# Patient Record
Sex: Female | Born: 1950 | Race: White | Hispanic: No | Smoking: Former smoker
Health system: Southern US, Community
[De-identification: ages and names within clinical notes are randomized; demographics above are authoritative.]

## PROBLEM LIST (undated history)

## (undated) DIAGNOSIS — T7840XA Allergy, unspecified, initial encounter: Secondary | ICD-10-CM

## (undated) DIAGNOSIS — I10 Essential (primary) hypertension: Secondary | ICD-10-CM

## (undated) DIAGNOSIS — E559 Vitamin D deficiency, unspecified: Secondary | ICD-10-CM

## (undated) DIAGNOSIS — F419 Anxiety disorder, unspecified: Secondary | ICD-10-CM

## (undated) DIAGNOSIS — E7800 Pure hypercholesterolemia, unspecified: Secondary | ICD-10-CM

## (undated) DIAGNOSIS — R413 Other amnesia: Secondary | ICD-10-CM

## (undated) DIAGNOSIS — M810 Age-related osteoporosis without current pathological fracture: Secondary | ICD-10-CM

## (undated) HISTORY — DX: Other amnesia: R41.3

## (undated) HISTORY — PX: TONSILLECTOMY: SUR1361

## (undated) HISTORY — DX: Pure hypercholesterolemia, unspecified: E78.00

## (undated) HISTORY — DX: Essential (primary) hypertension: I10

## (undated) HISTORY — PX: LAPAROSCOPY: SHX197

## (undated) HISTORY — DX: Anxiety disorder, unspecified: F41.9

## (undated) HISTORY — DX: Allergy, unspecified, initial encounter: T78.40XA

## (undated) HISTORY — DX: Age-related osteoporosis without current pathological fracture: M81.0

## (undated) HISTORY — DX: Vitamin D deficiency, unspecified: E55.9

---

## 2010-03-22 ENCOUNTER — Emergency Department (HOSPITAL_BASED_OUTPATIENT_CLINIC_OR_DEPARTMENT_OTHER): Admission: EM | Admit: 2010-03-22 | Discharge: 2010-03-22 | Payer: Self-pay | Admitting: Emergency Medicine

## 2010-03-22 ENCOUNTER — Ambulatory Visit: Payer: Self-pay | Admitting: Diagnostic Radiology

## 2017-04-18 ENCOUNTER — Ambulatory Visit (HOSPITAL_COMMUNITY): Admission: EM | Admit: 2017-04-18 | Discharge: 2017-04-18 | Discharge status: Absent without leave | Payer: Self-pay

## 2019-08-02 ENCOUNTER — Other Ambulatory Visit: Payer: Self-pay

## 2019-08-02 ENCOUNTER — Ambulatory Visit: Payer: Medicare PPO | Admitting: Neurology

## 2019-08-02 ENCOUNTER — Encounter: Payer: Self-pay | Admitting: Neurology

## 2019-08-02 ENCOUNTER — Telehealth: Payer: Self-pay | Admitting: Neurology

## 2019-08-02 VITALS — BP 168/88 | HR 71 | Temp 97.6°F | Ht 60.0 in | Wt 144.6 lb

## 2019-08-02 DIAGNOSIS — R413 Other amnesia: Secondary | ICD-10-CM

## 2019-08-02 DIAGNOSIS — E538 Deficiency of other specified B group vitamins: Secondary | ICD-10-CM | POA: Diagnosis not present

## 2019-08-02 NOTE — Telephone Encounter (Signed)
08/02/2019 Ethlyn Gallery [id number] exp 08/02/2019 -09/01/2019 GI Annabelle Harman c

## 2019-08-02 NOTE — Progress Notes (Signed)
PATIENT: Madison Glover DOB: 1951/04/16  Chief Complaint  Patient presents with  . Memory Loss    Rm 4, daughter Madison Glover, 6-8 mo worsening     HISTORICAL  Madison Glover is a 69 year old female, accompanied by her daughter Madison Glover, seen in request by her primary care PA Madison Glover for evaluation of memory loss, initial evaluation was on August 02, 2019.  I have reviewed and summarized the referring note from the referring physician.  She had a past medical history of hypertension, hyperlipidemia, is a retired Tourist information centre manager, she was noted to have gradual onset memory loss since 2018, she tends to repeat herself, misplace things, sometimes get up in the middle of the night confused  Patient herself denies significant difficulties, her symptoms seems to be more noticeable by her daughter.  She also complains of excessive stress in the past 10 years, now she is in a safe place, lives at her home, exercise regularly, attending church regularly  Laboratory evaluations in December 2020 showed normal CMP, with glucose of 110, creatinine of 1.0, LDL of 204, cholesterol of 272, vitamin B12 of 172, normal CBC, with hemoglobin of 13.8   REVIEW OF SYSTEMS: Full 14 system review of systems performed and notable only for as above All other review of systems were negative.  ALLERGIES: No Known Allergies  HOME MEDICATIONS: Current Outpatient Medications  Medication Sig Dispense Refill  . citalopram (CELEXA) 20 MG tablet Take 20 mg by mouth daily.    . hydrochlorothiazide (HYDRODIURIL) 25 MG tablet Take 25 mg by mouth daily.    . Metoprolol Succinate 100 MG CS24 Take by mouth daily.    . simvastatin (ZOCOR) 20 MG tablet Take 20 mg by mouth daily at 6 PM.     No current facility-administered medications for this visit.    PAST MEDICAL HISTORY: Past Medical History:  Diagnosis Date  . High cholesterol   . Hypertension     PAST SURGICAL HISTORY: History  reviewed. No pertinent surgical history.  FAMILY HISTORY: Family History  Problem Relation Age of Onset  . Cancer Mother   . COPD Father   . Heart disease Father     SOCIAL HISTORY: Social History   Socioeconomic History  . Marital status: Single    Spouse name: Not on file  . Number of children: Not on file  . Years of education: Not on file  . Highest education level: Not on file  Occupational History  . Not on file  Tobacco Use  . Smoking status: Never Smoker  . Smokeless tobacco: Never Used  Substance and Sexual Activity  . Alcohol use: Never  . Drug use: Never  . Sexual activity: Not on file  Other Topics Concern  . Not on file  Social History Narrative   Lives at home, alone, retired.  Education: [employer] Education. 2 daughters.    Social Determinants of Health   Financial Resource Strain:   . Difficulty of Paying Living Expenses: Not on file  Food Insecurity:   . Worried About Programme researcher, broadcasting/film/video in the Last Year: Not on file  . Ran Out of Food in the Last Year: Not on file  Transportation Needs:   . Lack of Transportation (Medical): Not on file  . Lack of Transportation (Non-Medical): Not on file  Physical Activity:   . Days of Exercise per Week: Not on file  . Minutes of Exercise per Session: Not on file  Stress:   .  Feeling of Stress : Not on file  Social Connections:   . Frequency of Communication with Friends and Family: Not on file  . Frequency of Social Gatherings with Friends and Family: Not on file  . Attends Religious Services: Not on file  . Active Member of Clubs or Organizations: Not on file  . Attends Archivist Meetings: Not on file  . Marital Status: Not on file  Intimate Partner Violence:   . Fear of Current or Ex-Partner: Not on file  . Emotionally Abused: Not on file  . Physically Abused: Not on file  . Sexually Abused: Not on file     PHYSICAL EXAM   Vitals:   08/02/19 0749  BP: (!) 168/88  Pulse: 71  Temp: 97.6  F (36.4 C)  Weight: 144 lb 9.6 oz (65.6 kg)  Height: 5' (1.524 m)    Not recorded      Body mass index is 28.24 kg/m.  PHYSICAL EXAMNIATION:  Gen: NAD, conversant, well nourised, well groomed                     Cardiovascular: Regular rate rhythm, no peripheral edema, warm, nontender. Eyes: Conjunctivae clear without exudates or hemorrhage Neck: Supple, no carotid bruits. Pulmonary: Clear to auscultation bilaterally   NEUROLOGICAL EXAM:  MMSE - Mini Mental State Exam 08/02/2019  Orientation to time 5  Orientation to Place 4  Registration 3  Attention/ Calculation 4  Recall 0  Language- name 2 objects 2  Language- repeat 1  Language- follow 3 step command 3  Language- read & follow direction 1  Write a sentence 1  Copy design 1  Total score 25  Animal naming 8   CRANIAL NERVES: CN II: Visual fields are full to confrontation. Pupils are round equal and briskly reactive to light. CN III, IV, VI: extraocular movement are normal. No ptosis. CN V: Facial sensation is intact to light touch CN VII: Face is symmetric with normal eye closure  CN VIII: Hearing is normal to causal conversation. CN IX, X: Phonation is normal. CN XI: Head turning and shoulder shrug are intact  MOTOR: There is no pronator drift of out-stretched arms. Muscle bulk and tone are normal. Muscle strength is normal.  REFLEXES: Reflexes are 2+ and symmetric at the biceps, triceps, knees, and ankles. Plantar responses are flexor.  SENSORY: Intact to light touch, pinprick and vibratory sensation are intact in fingers and toes.  COORDINATION: There is no trunk or limb dysmetria noted.  GAIT/STANCE: Posture is normal. Gait is steady with normal steps, base, arm swing, and turning. Heel and toe walking are normal. Tandem gait is normal.  Romberg is absent.   DIAGNOSTIC DATA (LABS, IMAGING, TESTING) - I reviewed patient records, labs, notes, testing and imaging myself where  available.   ASSESSMENT AND PLAN  Madison Glover is a 69 y.o. 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 supplement, 1000 mcg daily  Marcial Pacas, M.D. Ph.D.  [facility] [address] [place], [place] [address] Ph: [phone] Fax: [phone]  CC: Referring Provider

## 2019-08-02 NOTE — Patient Instructions (Signed)
Vitamin B12 one tablet daily

## 2019-09-13 NOTE — Telephone Encounter (Signed)
Updated Ethlyn Gallery: [id number] (exp. 09/19/19 to 10/19/19) patient is scheduled at GI for 09/19/19.

## 2019-09-19 ENCOUNTER — Other Ambulatory Visit: Payer: Self-pay

## 2019-10-08 ENCOUNTER — Other Ambulatory Visit: Payer: Self-pay

## 2019-10-15 ENCOUNTER — Ambulatory Visit
Admission: RE | Admit: 2019-10-15 | Discharge: 2019-10-15 | Disposition: A | Discharge status: Home / Self Care | Payer: Medicare PPO | Source: Ambulatory Visit | Attending: Neurology | Admitting: Neurology

## 2019-10-15 ENCOUNTER — Other Ambulatory Visit: Payer: Self-pay

## 2019-10-15 DIAGNOSIS — E538 Deficiency of other specified B group vitamins: Secondary | ICD-10-CM

## 2019-10-15 DIAGNOSIS — R413 Other amnesia: Secondary | ICD-10-CM

## 2019-10-17 ENCOUNTER — Telehealth: Payer: Self-pay | Admitting: Neurology

## 2019-10-17 ENCOUNTER — Telehealth: Payer: Self-pay

## 2019-10-17 NOTE — Telephone Encounter (Signed)
Voicemail left at 12:38p  Elonda Husky is requesting a call back to discuss MRI

## 2019-10-17 NOTE — Telephone Encounter (Signed)
I returned the call to her daughter, Elonda Husky [place] (on [place]). She is also aware of the MRI results. She plans to attend the patient's follow up on 10/24/19.

## 2019-10-17 NOTE — Telephone Encounter (Signed)
I was able to speak to patient and provide her with the MRI results below. She verbalized understanding and will keep her pending follow up for further review.

## 2019-10-17 NOTE — Telephone Encounter (Signed)
IMPRESSION:   MRI brain (without) demonstrating: - Multiple round periventricular and subcortical foci of T2 hyperintensities; likely chronic small vessel ischemic disease.  - No acute findings.  Please call patient, MRI of the brain showed generalized atrophy, supratentorium small vessel disease, there was no acute abnormalities.  I will review films with her at next follow-up visit

## 2019-10-18 NOTE — Telephone Encounter (Signed)
Open by error

## 2019-10-24 ENCOUNTER — Other Ambulatory Visit: Payer: Self-pay

## 2019-10-24 ENCOUNTER — Encounter: Payer: Self-pay | Admitting: Neurology

## 2019-10-24 ENCOUNTER — Ambulatory Visit: Payer: Medicare PPO | Admitting: Neurology

## 2019-10-24 VITALS — BP 139/81 | HR 66 | Temp 97.0°F | Ht 60.0 in | Wt 140.0 lb

## 2019-10-24 DIAGNOSIS — E538 Deficiency of other specified B group vitamins: Secondary | ICD-10-CM | POA: Diagnosis not present

## 2019-10-24 DIAGNOSIS — R413 Other amnesia: Secondary | ICD-10-CM

## 2019-10-24 MED ORDER — MEMANTINE HCL 5 MG PO TABS: ORAL_TABLET | ORAL | 0 refills | Status: DC

## 2019-10-24 NOTE — Patient Instructions (Signed)
Start taking aspirin 81 mg daily  Start namenda titration for the memory with 5 mg tablets   Take 1 tablet daily for one week, then take 1 tablet twice daily for one week, then take 1 tablet in the morning and 2 in the evening for one week, then take 2 tablets twice daily  Once complete titration, will send in 10 mg tablets   See you back in 6 months

## 2019-10-24 NOTE — Progress Notes (Signed)
I have reviewed and agreed above plan. 

## 2019-10-24 NOTE — Progress Notes (Signed)
PATIENT: Madison Glover DOB: 22-Mar-1951  REASON FOR VISIT: follow up HISTORY FROM: patient  HISTORY OF PRESENT ILLNESS: Today 10/24/19  HISTORY Madison Glover is a 69 year old female, accompanied by her daughter [name], seen in request by her primary care PA Marda Stalker for evaluation of memory loss, initial evaluation was on August 02, 2019.  I have reviewed and summarized the referring note from the referring physician.  She had a past medical history of hypertension, hyperlipidemia, is a retired Automotive engineer, she was noted to have gradual onset memory loss since 2018, she tends to repeat herself, misplace things, sometimes get up in the middle of the night confused  Patient herself denies significant difficulties, her symptoms seems to be more noticeable by her daughter.  She also complains of excessive stress in the past 10 years, now she is in a safe place, lives at her home, exercise regularly, attending church regularly  Laboratory evaluations in December 2020 showed normal CMP, with glucose of 110, creatinine of 1.0, LDL of 204, cholesterol of 272, vitamin B12 of 172, normal CBC, with hemoglobin of 13.8  Update October 24, 2019 SS: MRI of the brain in March 2021 showed generalized atrophy, supratentorium small vessel disease, no acute abnormality. Since last seen, has been stable. She is here with her daughter. She was diagnosed in 4th grade as slower learner, all her life, has to work hard to remember, is not an auditory person. Daughter mostly notices repetitive questioning. She has bad dreams at night. She is on Celexa for anxiety. She isn't driving, having issues getting lost, didn't remember going places. Daughter manages medications, patient is very active in church. Daughter comes nearly everyday to check in on her and help with activities. Reviewed her MRI's with Dr. Krista Blue prior to visit.   REVIEW OF SYSTEMS: Out of a complete 14 system review of  symptoms, the patient complains only of the following symptoms, and all other reviewed systems are negative.  Memory loss  ALLERGIES: No Known Allergies  HOME MEDICATIONS: Outpatient Medications Prior to Visit  Medication Sig Dispense Refill  . citalopram (CELEXA) 20 MG tablet Take 20 mg by mouth daily.    . hydrochlorothiazide (HYDRODIURIL) 25 MG tablet Take 25 mg by mouth daily.    . Metoprolol Succinate 100 MG CS24 Take by mouth daily.    . simvastatin (ZOCOR) 20 MG tablet Take 20 mg by mouth daily at 6 PM.     No facility-administered medications prior to visit.    PAST MEDICAL HISTORY: Past Medical History:  Diagnosis Date  . High cholesterol   . Hypertension     PAST SURGICAL HISTORY: No past surgical history on file.  FAMILY HISTORY: Family History  Problem Relation Age of Onset  . Cancer Mother   . COPD Father   . Heart disease Father     SOCIAL HISTORY: Social History   Socioeconomic History  . Marital status: Single    Spouse name: Not on file  . Number of children: Not on file  . Years of education: Not on file  . Highest education level: Not on file  Occupational History  . Not on file  Tobacco Use  . Smoking status: Never Smoker  . Smokeless tobacco: Never Used  Substance and Sexual Activity  . Alcohol use: Never  . Drug use: Never  . Sexual activity: Not on file  Other Topics Concern  . Not on file  Social History Narrative   Lives at  home, alone, retired.  Education: [employer] Education. 2 daughters.    Social Determinants of Health   Financial Resource Strain:   . Difficulty of Paying Living Expenses:   Food Insecurity:   . Worried About Programme researcher, broadcasting/film/video in the Last Year:   . Barista in the Last Year:   Transportation Needs:   . Freight forwarder (Medical):   Marland Kitchen Lack of Transportation (Non-Medical):   Physical Activity:   . Days of Exercise per Week:   . Minutes of Exercise per Session:   Stress:   . Feeling of  Stress :   Social Connections:   . Frequency of Communication with Friends and Family:   . Frequency of Social Gatherings with Friends and Family:   . Attends Religious Services:   . Active Member of Clubs or Organizations:   . Attends Banker Meetings:   Marland Kitchen Marital Status:   Intimate Partner Violence:   . Fear of Current or Ex-Partner:   . Emotionally Abused:   Marland Kitchen Physically Abused:   . Sexually Abused:       PHYSICAL EXAM  Vitals:   10/24/19 1239  BP: 139/81  Pulse: 66  Temp: (!) 97 F (36.1 C)  Weight: 140 lb (63.5 kg)  Height: 5' (1.524 m)   Body mass index is 27.34 kg/m.  Generalized: Well developed, in no acute distress  MMSE - Mini Mental State Exam 10/24/2019 08/02/2019  Orientation to time 2 5  Orientation to Place 5 4  Registration 3 3  Attention/ Calculation 5 4  Recall 2 0  Language- name 2 objects 2 2  Language- repeat 1 1  Language- follow 3 step command 3 3  Language- read & follow direction 1 1  Write a sentence 1 1  Copy design 1 1  Total score 26 25    Neurological examination  Mentation: Alert oriented to time, place, history taking. Follows all commands speech and language fluent, repetitive questioning, loses train of thought Cranial nerve II-XII: Pupils were equal round reactive to light. Extraocular movements were full, visual field were full on confrontational test. Facial sensation and strength were normal.  Head turning and shoulder shrug  were normal and symmetric. Motor: The motor testing reveals 5 over 5 strength of all 4 extremities. Good symmetric motor tone is noted throughout.  Sensory: Sensory testing is intact to soft touch on all 4 extremities. No evidence of extinction is noted.  Coordination: Cerebellar testing reveals good finger-nose-finger and heel-to-shin bilaterally.  Gait and station: Gait is normal. Tandem gait is normal. Romberg is negative. No drift is seen.  Reflexes: Deep tendon reflexes are symmetric and  normal bilaterally.   DIAGNOSTIC DATA (LABS, IMAGING, TESTING) - I reviewed patient records, labs, notes, testing and imaging myself where available.  No results found for: WBC, HGB, HCT, MCV, PLT No results found for: NA, K, CL, CO2, GLUCOSE, BUN, CREATININE, CALCIUM, PROT, ALBUMIN, AST, ALT, ALKPHOS, BILITOT, GFRNONAA, GFRAA No results found for: CHOL, HDL, LDLCALC, LDLDIRECT, TRIG, CHOLHDL No results found for: EPPI9J No results found for: VITAMINB12 No results found for: TSH   ASSESSMENT AND PLAN 69 y.o. year old female  has a past medical history of High cholesterol and Hypertension. here with:  1.  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  B12 deficiency -On oral supplement, would like rechecked, can do this today   I spent 30 minutes of face-to-face and non-face-to-face time with patient.  This included previsit chart review, lab review, study review, order entry, electronic health record documentation, patient education.  Margie Ege, AGNP-C, DNP 10/24/2019, 12:53 PM [facility] [address] [place], [place] [address] [phone]

## 2019-10-25 ENCOUNTER — Telehealth: Payer: Self-pay | Admitting: *Deleted

## 2019-10-25 LAB — VITAMIN B12: Vitamin B-12: 597 pg/mL (ref 232–1245)

## 2019-10-25 NOTE — Telephone Encounter (Signed)
-----   Message from Glean Salvo, NP sent at 10/25/2019  6:02 AM EDT ----- B12 level is within normal, she can stay on supplement. Have PCP continue to follow.

## 2019-10-25 NOTE — Telephone Encounter (Signed)
Spoke to pt and daughter, relayed that B12 level normal. Stay on supplement and pcp to follow. Daughter verbalized understanding.

## 2019-10-31 ENCOUNTER — Ambulatory Visit: Payer: Medicare PPO | Admitting: Neurology

## 2019-11-01 ENCOUNTER — Other Ambulatory Visit: Payer: Self-pay | Admitting: Neurology

## 2019-11-02 MED ORDER — MEMANTINE HCL 10 MG PO TABS: 10.0000 mg | ORAL_TABLET | Freq: Two times a day (BID) | ORAL | 11 refills | Status: DC

## 2019-11-15 ENCOUNTER — Other Ambulatory Visit: Payer: Self-pay | Admitting: Neurology

## 2019-11-24 ENCOUNTER — Telehealth: Payer: Self-pay | Admitting: Neurology

## 2019-11-24 DIAGNOSIS — R413 Other amnesia: Secondary | ICD-10-CM

## 2019-11-24 NOTE — Telephone Encounter (Signed)
Pt would like to know if a letter can be mailed to her stating that she can start driving again. Pt would like to be called and informed if this can be done and when it will be mailed. Please advise.

## 2019-11-24 NOTE — Telephone Encounter (Signed)
Reviewed the chart.  At my last visit, she told me she was no longer driving, because she was having issues getting lost, and going places she did not remember.  If this is the case, I would not recommend she drive.  Her memory score was 26/30. If she desires, she may have occupational therapy evaluation for her driving.

## 2019-11-24 NOTE — Telephone Encounter (Signed)
I called pt and her VM full, could LM.

## 2019-11-25 NOTE — Telephone Encounter (Signed)
Pt called back. Please call when available.

## 2019-11-28 NOTE — Telephone Encounter (Signed)
I called pt and he could not understanding the reasoning of why she could not drive, when she recalls when in the office that she could drive, she just had not due to being ill.  Had gotten lost once when looking for her new daughters home in [facility] (when she had not been there before).  She repeated herself multiple times (4 txmes).  She did want to proceed with driving evaluation.

## 2019-11-28 NOTE — Telephone Encounter (Signed)
Called pt, could not LM as VM full.

## 2019-11-28 NOTE — Telephone Encounter (Signed)
I called pt and she

## 2019-11-28 NOTE — Telephone Encounter (Signed)
I will place the order for driving evaluation, for drivers rehabilitation services.

## 2019-11-28 NOTE — Addendum Note (Signed)
Addended by: Glean Salvo on: 11/28/2019 03:59 PM   Modules accepted: Orders

## 2019-12-29 ENCOUNTER — Telehealth: Payer: Self-pay | Admitting: Neurology

## 2019-12-29 NOTE — Telephone Encounter (Signed)
Called and left patient a message asking her to call me back so I can talk to her about Driver rehab services .

## 2020-01-04 NOTE — Telephone Encounter (Signed)
Called and again and tried to leave message voice mail was full and could not leave a message.

## 2020-01-18 NOTE — Telephone Encounter (Signed)
Called and left another message

## 2020-01-22 ENCOUNTER — Other Ambulatory Visit: Payer: Self-pay | Admitting: Neurology

## 2020-01-25 ENCOUNTER — Telehealth: Payer: Self-pay | Admitting: Neurology

## 2020-01-25 NOTE — Telephone Encounter (Signed)
Pt is asking for a call re: what medication she is supposed to be on, please call

## 2020-01-26 NOTE — Telephone Encounter (Addendum)
Called pt back to discuss. Advised I reviewed SS,NP last OV from 10/24/19. She should be taking namenda and ASA 81mg  po qd OTC. She was advised to hold aricept d/t bad dreams. She verbalized understanding and will f/u with [facility]. Currently her daughter handles medications and she wanted to make sure she was taking the correct meds. Nothing further needed.

## 2020-02-04 ENCOUNTER — Other Ambulatory Visit: Payer: Self-pay | Admitting: Neurology

## 2020-02-08 ENCOUNTER — Other Ambulatory Visit: Payer: Self-pay | Admitting: Neurology

## 2020-02-14 DIAGNOSIS — Z7982 Long term (current) use of aspirin: Secondary | ICD-10-CM | POA: Diagnosis not present

## 2020-02-14 DIAGNOSIS — F0390 Unspecified dementia without behavioral disturbance: Secondary | ICD-10-CM | POA: Diagnosis not present

## 2020-02-14 DIAGNOSIS — I10 Essential (primary) hypertension: Secondary | ICD-10-CM | POA: Diagnosis not present

## 2020-02-14 DIAGNOSIS — F324 Major depressive disorder, single episode, in partial remission: Secondary | ICD-10-CM | POA: Diagnosis not present

## 2020-02-14 DIAGNOSIS — E785 Hyperlipidemia, unspecified: Secondary | ICD-10-CM | POA: Diagnosis not present

## 2020-02-16 ENCOUNTER — Telehealth: Payer: Self-pay | Admitting: Neurology

## 2020-02-16 NOTE — Telephone Encounter (Signed)
Ok to switch to Dr. [name]

## 2020-02-16 NOTE — Telephone Encounter (Signed)
Noted  

## 2020-02-16 NOTE — Telephone Encounter (Signed)
I called the patient back. She had an appt w/ Dr. Terrace Arabia on 03/19/20 for memory. Dr. Terrace Arabia will be out of the office that day. She needs a sooner appt. She saw Maralyn Sago for memory on 10/24/19. She has been scheduled for an earlier appt w/ Sarah on 02/22/20.

## 2020-02-16 NOTE — Telephone Encounter (Signed)
Pt requesting to change providers from [facility] to Dr Dohmeier based on reviews she has read on Dr [name]

## 2020-02-16 NOTE — Telephone Encounter (Signed)
Patient called today stating that she needs an appointment sooner than 8/23. She states she is going to court because her daughter is trying to get her driver's license taken away. She vehemently denies that she has memory loss and states she is in perfect health. She is wanting a call from RN to discuss. She states if she can't get a sooner appointment she would like a referral to a different neurologist. Lorain Childes

## 2020-02-20 ENCOUNTER — Telehealth: Payer: Self-pay | Admitting: Neurology

## 2020-02-21 NOTE — Telephone Encounter (Signed)
I am sorry, but I will have to decline.

## 2020-02-22 ENCOUNTER — Ambulatory Visit: Payer: Self-pay | Admitting: Neurology

## 2020-03-16 NOTE — Telephone Encounter (Signed)
error 

## 2020-03-19 ENCOUNTER — Institutional Professional Consult (permissible substitution): Payer: Medicare PPO | Admitting: Neurology

## 2020-04-10 ENCOUNTER — Other Ambulatory Visit: Payer: Self-pay | Admitting: Neurology

## 2020-04-17 ENCOUNTER — Other Ambulatory Visit: Payer: Self-pay | Admitting: Internal Medicine

## 2020-04-17 ENCOUNTER — Other Ambulatory Visit: Payer: Self-pay

## 2020-04-17 ENCOUNTER — Encounter: Payer: Self-pay | Admitting: Internal Medicine

## 2020-04-17 ENCOUNTER — Ambulatory Visit: Payer: Medicare PPO | Admitting: Internal Medicine

## 2020-04-17 VITALS — BP 130/80 | HR 58 | Temp 98.2°F | Ht 60.0 in | Wt 140.2 lb

## 2020-04-17 DIAGNOSIS — Z13820 Encounter for screening for osteoporosis: Secondary | ICD-10-CM

## 2020-04-17 DIAGNOSIS — F339 Major depressive disorder, recurrent, unspecified: Secondary | ICD-10-CM

## 2020-04-17 DIAGNOSIS — Z1211 Encounter for screening for malignant neoplasm of colon: Secondary | ICD-10-CM | POA: Diagnosis not present

## 2020-04-17 DIAGNOSIS — Z23 Encounter for immunization: Secondary | ICD-10-CM

## 2020-04-17 DIAGNOSIS — Z803 Family history of malignant neoplasm of breast: Secondary | ICD-10-CM | POA: Diagnosis not present

## 2020-04-17 DIAGNOSIS — E7800 Pure hypercholesterolemia, unspecified: Secondary | ICD-10-CM

## 2020-04-17 DIAGNOSIS — I10 Essential (primary) hypertension: Secondary | ICD-10-CM | POA: Diagnosis not present

## 2020-04-17 DIAGNOSIS — R413 Other amnesia: Secondary | ICD-10-CM

## 2020-04-17 MED ORDER — HYDROCHLOROTHIAZIDE 25 MG PO TABS: 25.0000 mg | ORAL_TABLET | Freq: Every day | ORAL | 1 refills | Status: DC

## 2020-04-17 MED ORDER — CITALOPRAM HYDROBROMIDE 20 MG PO TABS: 20.0000 mg | ORAL_TABLET | Freq: Every day | ORAL | 1 refills | Status: DC

## 2020-04-17 MED ORDER — METOPROLOL SUCCINATE 100 MG PO CS24: 100.0000 mg | EXTENDED_RELEASE_CAPSULE | Freq: Every day | ORAL | 1 refills | Status: DC

## 2020-04-17 MED ORDER — SIMVASTATIN 20 MG PO TABS: 20.0000 mg | ORAL_TABLET | Freq: Every day | ORAL | 1 refills | Status: DC

## 2020-04-17 NOTE — Patient Instructions (Signed)
-  Nice seeing you today!!  -Flu and pneumonia vaccines today.  -Mammogram and bone density test have been requested.  -Schedule follow up in December for your physical. Please come in fasting that day.

## 2020-04-17 NOTE — Progress Notes (Signed)
New Patient Office Visit     This visit occurred during the SARS-CoV-2 public health emergency.  Safety protocols were in place, including screening questions prior to the visit, additional usage of staff PPE, and extensive cleaning of exam room while observing appropriate contact time as indicated for disinfecting solutions.    CC/Reason for Visit: Establish care, discuss chronic medical conditions Previous PCP: Jarrett Soho, PA Last Visit: December 2020  HPI: Madison Glover is a 69 y.o. female who is coming in today for the above mentioned reasons. Past Medical History is significant for: Hypertension, hyperlipidemia, history of dementia/short-term memory loss.  She is here today with her friend/advocate.  Patient is very repetitive.  She is upbeat and in good spirits.  She is having issues with her daughter as it appears her daughter had a discussion with her previous PCP about concerns about her driving and getting lost, this was reported to the [facility] and her driver's license was revoked.  She is trying to get it reinstated.  She is currently seeing a neurologist she has been referred to driver's rehab which she has not yet done.  She states she has very supportive friends and a grocery store nearby that she could walk to.  She is requesting medication refills.  She is due for flu and pneumonia vaccine updates.  She is behind on all cancer screening.   Past Medical/Surgical History: Past Medical History:  Diagnosis Date  . High cholesterol   . Hypertension     No past surgical history on file.  Social History:  reports that she has never smoked. She has never used smokeless tobacco. She reports that she does not drink alcohol and does not use drugs.  Allergies: No Known Allergies  Family History:  Family History  Problem Relation Age of Onset  . Cancer Mother   . COPD Father   . Heart disease Father      Current Outpatient Medications:  .  aspirin EC 81 MG  tablet, Take 81 mg by mouth daily. Swallow whole., Disp: , Rfl:  .  citalopram (CELEXA) 20 MG tablet, Take 20 mg by mouth daily., Disp: , Rfl:  .  hydrochlorothiazide (HYDRODIURIL) 25 MG tablet, Take 25 mg by mouth daily., Disp: , Rfl:  .  memantine (NAMENDA) 10 MG tablet, TAKE 1 TABLET BY MOUTH TWICE A DAY, Disp: 180 tablet, Rfl: 1 .  Metoprolol Succinate 100 MG CS24, Take by mouth daily., Disp: , Rfl:  .  simvastatin (ZOCOR) 20 MG tablet, Take 20 mg by mouth daily at 6 PM., Disp: , Rfl:  .  vitamin B-12 (CYANOCOBALAMIN) 1000 MCG tablet, Take 1,000 mcg by mouth daily., Disp: , Rfl:   Review of Systems:  Constitutional: Denies fever, chills, diaphoresis, appetite change and fatigue.  HEENT: Denies photophobia, eye pain, redness, hearing loss, ear pain, congestion, sore throat, rhinorrhea, sneezing, mouth sores, trouble swallowing, neck pain, neck stiffness and tinnitus.   Respiratory: Denies SOB, DOE, cough, chest tightness,  and wheezing.   Cardiovascular: Denies chest pain, palpitations and leg swelling.  Gastrointestinal: Denies nausea, vomiting, abdominal pain, diarrhea, constipation, blood in stool and abdominal distention.  Genitourinary: Denies dysuria, urgency, frequency, hematuria, flank pain and difficulty urinating.  Endocrine: Denies: hot or cold intolerance, sweats, changes in hair or nails, polyuria, polydipsia. Musculoskeletal: Denies myalgias, back pain, joint swelling, arthralgias and gait problem.  Skin: Denies pallor, rash and wound.  Neurological: Denies dizziness, seizures, syncope, weakness, light-headedness, numbness and headaches.  Hematological: Denies  adenopathy. Easy bruising, personal or family bleeding history  Psychiatric/Behavioral: Denies suicidal ideation, mood changes, confusion, nervousness, sleep disturbance and agitation    Physical Exam: Vitals:   04/17/20 1511  BP: 130/80  Pulse: (!) 58  Temp: 98.2 F (36.8 C)  TempSrc: Oral  SpO2: 96%  Weight:  140 lb 3.2 oz (63.6 kg)  Height: 5' (1.524 m)   Body mass index is 27.38 kg/m.  Constitutional: NAD, calm, comfortable Eyes: PERRL, lids and conjunctivae normal, wears corrective lenses ENMT: Mucous membranes are moist. Respiratory: clear to auscultation bilaterally, no wheezing, no crackles. Normal respiratory effort. No accessory muscle use.  Cardiovascular: Regular rate and rhythm, no murmurs / rubs / gallops. No extremity edema.   Neurologic: CN 2-12 grossly intact. Sensation intact, DTR normal. Strength 5/5 in all 4.  She is very repetitive. Psychiatric:  Alert and oriented x 3. Normal mood.    Impression and Plan:  Memory loss MMSE - Mini Mental State Exam 10/24/2019 08/02/2019  Orientation to time 2 5  Orientation to Place 5 4  Registration 3 3  Attention/ Calculation 5 4  Recall 2 0  Language- name 2 objects 2 2  Language- repeat 1 1  Language- follow 3 step command 3 3  Language- read & follow direction 1 1  Write a sentence 1 1  Copy design 1 1  Total score 26 25   -She is currently on Namenda, was taken off Aricept due to concerns with bad dreams. -She is followed by neurology and has follow-up appointment in October.   Family history of breast cancer  - Plan: MM Digital Screening  Screening for malignant neoplasm of colon  - Plan: Ambulatory referral to Gastroenterology  Screening for osteoporosis  - Plan: DG Bone Density  Essential hypertension -Well-controlled.  High cholesterol -Last LDL was quite elevated at 025 in December 2020, she is on simvastatin 20 mg. -Recheck lipids when she returns for CPE.  Depression, recurrent (HCC) -Mood is stable on Celexa.  Need for influenza vaccination -Flu vaccine administered today.  Need for vaccination against Streptococcus pneumoniae -Pneumovax administered today.    Patient Instructions  -Nice seeing you today!!  -Flu and pneumonia vaccines today.  -Mammogram and bone density test have been  requested.  -Schedule follow up in December for your physical. Please come in fasting that day.     [name], MD [facility]

## 2020-04-17 NOTE — Addendum Note (Signed)
Addended by: Kern Reap B on: 04/17/2020 04:07 PM   Modules accepted: Orders

## 2020-04-27 ENCOUNTER — Other Ambulatory Visit: Payer: Self-pay

## 2020-04-27 ENCOUNTER — Ambulatory Visit (INDEPENDENT_AMBULATORY_CARE_PROVIDER_SITE_OTHER)
Admission: RE | Admit: 2020-04-27 | Discharge: 2020-04-27 | Disposition: A | Discharge status: Home / Self Care | Payer: Medicare PPO | Source: Ambulatory Visit | Attending: Internal Medicine | Admitting: Internal Medicine

## 2020-04-27 DIAGNOSIS — M810 Age-related osteoporosis without current pathological fracture: Secondary | ICD-10-CM

## 2020-04-27 DIAGNOSIS — Z13820 Encounter for screening for osteoporosis: Secondary | ICD-10-CM

## 2020-04-30 ENCOUNTER — Ambulatory Visit: Payer: Medicare PPO | Admitting: Neurology

## 2020-04-30 ENCOUNTER — Other Ambulatory Visit: Payer: Medicare PPO

## 2020-05-02 ENCOUNTER — Encounter: Payer: Self-pay | Admitting: Internal Medicine

## 2020-05-02 DIAGNOSIS — M810 Age-related osteoporosis without current pathological fracture: Secondary | ICD-10-CM | POA: Insufficient documentation

## 2020-05-07 NOTE — Progress Notes (Signed)
PATIENT: Madison Glover DOB: 1951/03/23  REASON FOR VISIT: follow up HISTORY FROM: patient  HISTORY OF PRESENT ILLNESS: Today 05/08/20  HISTORY  Madison Glover a 69 year old female,accompanied by her daughter [name] in request byher primary care PA Delena Serve, Courtneyfor evaluation of memory loss, initial evaluation was on August 02, 2019.  I have reviewed and summarized the referring note from the referring physician.She had a past medical history of hypertension, hyperlipidemia, is a retired Tourist information centre manager, she was noted to have gradual onset memory loss since 2018, she tends to repeat herself, misplace things, sometimes get up in the middle of the night confused  Patient herself denies significant difficulties, her symptoms seems to be more noticeable by her daughter.  She also complains of excessive stress in the past 10 years, now she is in a safe place, lives at her home, exercise regularly, attending church regularly  Laboratory evaluations in December 2020 showed normal CMP, with glucose of 110, creatinine of 1.0, LDL of 204, cholesterol of 272, vitamin B12 of 172, normal CBC, with hemoglobin of 13.8  Update October 24, 2019 SS: MRI of the brain in March 2021 showed generalized atrophy, supratentorium small vessel disease, no acute abnormality. Since last seen, has been stable. She is here with her daughter. She was diagnosed in 4th grade as slower learner, all her life, has to work hard to remember, is not an auditory person. Daughter mostly notices repetitive questioning. She has bad dreams at night. She is on Celexa for anxiety. She isn't driving, having issues getting lost, didn't remember going places. Daughter manages medications, patient is very active in church. Daughter comes nearly everyday to check in on her and help with activities. Reviewed her MRI's with Dr. Terrace Arabia prior to visit.   Update May 08, 2020 SS: Here today accompanied  by her best friend, [name], she has established with a new primary doctor, her daughter reportedly " abandoned her", ([name]), has not seen her in several months.  Reportedly, she took her car, turned her into the [facility], she lost her driver's license.  She now has a new POA, has a strong support system of friends, church family who drives her, assist with filling her pillbox, reminding her to pay her bills.  Still lives alone, walks across the street to get take out food.  No longer uses the stove, worry she will forget to turn it off, does use the microwave.  Is on Namenda, not on Aricept, she says no report of bad dreams, would like to try again.  Frequent repetitive statements noted, mentions several times, since fourth grade, has been a slow learner, is not an auditory processor, is visual.  Has 2 cats, a lot of friends, " I am very happy".  Poor short-term memory, looking to get someone to come to the house a few days a week, to help with housework, meal prep.  On B12 supplement.  Does have anxiety, depression.  MMSE 25/30. [name] took her to family services over the summer, has been in counseling.   REVIEW OF SYSTEMS: Out of a complete 14 system review of symptoms, the patient complains only of the following symptoms, and all other reviewed systems are negative.  Memory loss  ALLERGIES: No Known Allergies  HOME MEDICATIONS: Outpatient Medications Prior to Visit  Medication Sig Dispense Refill  . aspirin EC 81 MG tablet Take 81 mg by mouth daily. Swallow whole.    . citalopram (CELEXA) 20 MG tablet Take 1 tablet (  20 mg total) by mouth daily. 90 tablet 1  . hydrochlorothiazide (HYDRODIURIL) 25 MG tablet Take 1 tablet (25 mg total) by mouth daily. 90 tablet 1  . memantine (NAMENDA) 10 MG tablet TAKE 1 TABLET BY MOUTH TWICE A DAY 180 tablet 1  . Metoprolol Succinate 100 MG CS24 Take 100 mg by mouth daily. 90 capsule 1  . simvastatin (ZOCOR) 20 MG tablet Take 1 tablet (20 mg total) by mouth daily at 6  PM. 90 tablet 1  . vitamin B-12 (CYANOCOBALAMIN) 1000 MCG tablet Take 1,000 mcg by mouth daily.     No facility-administered medications prior to visit.    PAST MEDICAL HISTORY: Past Medical History:  Diagnosis Date  . High cholesterol   . Hypertension     PAST SURGICAL HISTORY: No past surgical history on file.  FAMILY HISTORY: Family History  Problem Relation Age of Onset  . Cancer Mother   . COPD Father   . Heart disease Father     SOCIAL HISTORY: Social History   Socioeconomic History  . Marital status: Divorced    Spouse name: Not on file  . Number of children: Not on file  . Years of education: Not on file  . Highest education level: Not on file  Occupational History  . Not on file  Tobacco Use  . Smoking status: Never Smoker  . Smokeless tobacco: Never Used  Substance and Sexual Activity  . Alcohol use: Never  . Drug use: Never  . Sexual activity: Not on file  Other Topics Concern  . Not on file  Social History Narrative   Lives at home, alone, retired.  Education: [employer]. 2 daughters.    Social Determinants of Health   Financial Resource Strain:   . Difficulty of Paying Living Expenses: Not on file  Food Insecurity:   . Worried About Programme researcher, broadcasting/film/video in the Last Year: Not on file  . Ran Out of Food in the Last Year: Not on file  Transportation Needs:   . Lack of Transportation (Medical): Not on file  . Lack of Transportation (Non-Medical): Not on file  Physical Activity:   . Days of Exercise per Week: Not on file  . Minutes of Exercise per Session: Not on file  Stress:   . Feeling of Stress : Not on file  Social Connections:   . Frequency of Communication with Friends and Family: Not on file  . Frequency of Social Gatherings with Friends and Family: Not on file  . Attends Religious Services: Not on file  . Active Member of Clubs or Organizations: Not on file  . Attends Banker Meetings: Not on file  . Marital  Status: Not on file  Intimate Partner Violence:   . Fear of Current or Ex-Partner: Not on file  . Emotionally Abused: Not on file  . Physically Abused: Not on file  . Sexually Abused: Not on file   PHYSICAL EXAM  Vitals:   05/08/20 0735  BP: 139/85  Pulse: (!) 59  Weight: 141 lb (64 kg)  Height: 4\' 11"  (1.499 m)   Body mass index is 28.48 kg/m.  Generalized: Well developed, in no acute distress  MMSE - Mini Mental State Exam 05/08/2020 10/24/2019 08/02/2019  Orientation to time 4 2 5   Orientation to Place 5 5 4   Registration 3 3 3   Attention/ Calculation 3 5 4   Recall 1 2 0  Language- name 2 objects 2 2 2   Language- repeat  1 1 1   Language- follow 3 step command 3 3 3   Language- read & follow direction 1 1 1   Write a sentence 1 1 1   Copy design 1 1 1   Total score 25 26 25     Neurological examination  Mentation: Alert oriented to time, place, history taking. Follows all commands speech and language fluent, repetitive statements noted Cranial nerve II-XII: Pupils were equal round reactive to light. Extraocular movements were full, visual field were full on confrontational test. Facial sensation and strength were normal. Head turning and shoulder shrug  were normal and symmetric. Motor: The motor testing reveals 5 over 5 strength of all 4 extremities. Good symmetric motor tone is noted throughout.  Sensory: Sensory testing is intact to soft touch on all 4 extremities. No evidence of extinction is noted.  Coordination: Cerebellar testing reveals good finger-nose-finger and heel-to-shin bilaterally.  Gait and station: Gait is normal. Tandem gait is slightly unsteady. Romberg is negative. No drift is seen.  Reflexes: Deep tendon reflexes are symmetric and normal bilaterally.   DIAGNOSTIC DATA (LABS, IMAGING, TESTING) - I reviewed patient records, labs, notes, testing and imaging myself where available.  No results found for: WBC, HGB, HCT, MCV, PLT No results found for: NA, K,  CL, CO2, GLUCOSE, BUN, CREATININE, CALCIUM, PROT, ALBUMIN, AST, ALT, ALKPHOS, BILITOT, GFRNONAA, GFRAA No results found for: CHOL, HDL, LDLCALC, LDLDIRECT, TRIG, CHOLHDL No results found for: Lab Results  Component Value Date   VITAMINB12 597 10/24/2019   No results found for: TSH  ASSESSMENT AND PLAN 69 y.o. year old female  has a past medical history of High cholesterol and Hypertension. here with:  1.  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  B12 deficiency -Remains on oral supplement  I spent 30  minutes of face-to-face and non-face-to-face time with patient.  This included previsit chart review, lab review, study review, order entry, electronic health record documentation, patient education.  , AGNP-C, DNP 05/08/2020, 7:44 AM [facility] [address], [address] [place], 10/26/2019 [phone]

## 2020-05-08 ENCOUNTER — Ambulatory Visit: Payer: Medicare PPO | Admitting: Neurology

## 2020-05-08 ENCOUNTER — Encounter: Payer: Self-pay | Admitting: Neurology

## 2020-05-08 ENCOUNTER — Telehealth: Payer: Self-pay | Admitting: Neurology

## 2020-05-08 VITALS — BP 139/85 | HR 59 | Ht 59.0 in | Wt 141.0 lb

## 2020-05-08 DIAGNOSIS — R413 Other amnesia: Secondary | ICD-10-CM | POA: Diagnosis not present

## 2020-05-08 MED ORDER — DONEPEZIL HCL 10 MG PO TABS: 10.0000 mg | ORAL_TABLET | Freq: Every day | ORAL | 5 refills | Status: DC

## 2020-05-08 NOTE — Patient Instructions (Addendum)
Start taking Aricept, start taking 1/2 tablet at bedtime x 1 week, then take 1 full tablet at bedtime  Continue Namenda at current dosing I will send you to neuropsychology consultation  Continue seeing your primary care doctor See you back in 6 months   Donepezil tablets What is this medicine? DONEPEZIL (doe NEP e zil) is used to treat mild to moderate dementia caused by Alzheimer's disease. This medicine may be used for other purposes; ask your health care provider or pharmacist if you have questions. COMMON BRAND NAME(S): Aricept What should I tell my health care provider before I take this medicine? They need to know if you have any of these conditions:  asthma or other lung disease  difficulty passing urine  head injury  heart disease  history of irregular heartbeat  liver disease  seizures (convulsions)  stomach or intestinal disease, ulcers or stomach bleeding  an unusual or allergic reaction to donepezil, other medicines, foods, dyes, or preservatives  pregnant or trying to get pregnant  breast-feeding How should I use this medicine? Take this medicine by mouth with a glass of water. Follow the directions on the prescription label. You may take this medicine with or without food. Take this medicine at regular intervals. This medicine is usually taken before bedtime. Do not take it more often than directed. Continue to take your medicine even if you feel better. Do not stop taking except on your doctor's advice. If you are taking the 23 mg donepezil tablet, swallow it whole; do not cut, crush, or chew it. Talk to your pediatrician regarding the use of this medicine in children. Special care may be needed. Overdosage: If you think you have taken too much of this medicine contact a poison control center or emergency room at once. NOTE: This medicine is only for you. Do not share this medicine with others. What if I miss a dose? If you miss a dose, take it as soon as you  can. If it is almost time for your next dose, take only that dose, do not take double or extra doses. What may interact with this medicine? Do not take this medicine with any of the following medications:  certain medicines for fungal infections like itraconazole, fluconazole, posaconazole, and voriconazole  cisapride  dextromethorphan; quinidine  dronedarone  pimozide  quinidine  thioridazine This medicine may also interact with the following medications:  antihistamines for allergy, cough and cold  atropine  bethanechol  carbamazepine  certain medicines for bladder problems like oxybutynin, tolterodine  certain medicines for Parkinson's disease like benztropine, trihexyphenidyl  certain medicines for stomach problems like dicyclomine, hyoscyamine  certain medicines for travel sickness like scopolamine  dexamethasone  dofetilide  ipratropium  NSAIDs, medicines for pain and inflammation, like ibuprofen or naproxen  other medicines for Alzheimer's disease  other medicines that prolong the QT interval (cause an abnormal heart rhythm)  phenobarbital  phenytoin  rifampin, rifabutin or rifapentine  ziprasidone This list may not describe all possible interactions. Give your health care provider a list of all the medicines, herbs, non-prescription drugs, or dietary supplements you use. Also tell them if you smoke, drink alcohol, or use illegal drugs. Some items may interact with your medicine. What should I watch for while using this medicine? Visit your doctor or health care professional for regular checks on your progress. Check with your doctor or health care professional if your symptoms do not get better or if they get worse. You may get drowsy or dizzy. Do  not drive, use machinery, or do anything that needs mental alertness until you know how this drug affects you. What side effects may I notice from receiving this medicine? Side effects that you should  report to your doctor or health care professional as soon as possible:  allergic reactions like skin rash, itching or hives, swelling of the face, lips, or tongue  feeling faint or lightheaded, falls  loss of bladder control  seizures  signs and symptoms of a dangerous change in heartbeat or heart rhythm like chest pain; dizziness; fast or irregular heartbeat; palpitations; feeling faint or lightheaded, falls; breathing problems  signs and symptoms of infection like fever or chills; cough; sore throat; pain or trouble passing urine  signs and symptoms of liver injury like dark yellow or brown urine; general ill feeling or flu-like symptoms; light-colored stools; loss of appetite; nausea; right upper belly pain; unusually weak or tired; yellowing of the eyes or skin  slow heartbeat or palpitations  unusual bleeding or bruising  vomiting Side effects that usually do not require medical attention (report to your doctor or health care professional if they continue or are bothersome):  diarrhea, especially when starting treatment  headache  loss of appetite  muscle cramps  nausea  stomach upset This list may not describe all possible side effects. Call your doctor for medical advice about side effects. You may report side effects to FDA at 1-800-FDA-1088. Where should I keep my medicine? Keep out of reach of children. Store at room temperature between 15 and 30 degrees C (59 and 86 degrees F). Throw away any unused medicine after the expiration date. NOTE: This sheet is a summary. It may not cover all possible information. If you have questions about this medicine, talk to your doctor, pharmacist, or health care provider.  2020 Elsevier/Gold Standard (2018-07-05 10:33:41)

## 2020-05-08 NOTE — Telephone Encounter (Signed)
Patient called and left a message about Referral . Dr. Kieth Brightly' office will call her to schedule . Telephone [phone]. Process takes about two weeks.

## 2020-05-09 ENCOUNTER — Encounter: Payer: Self-pay | Admitting: Psychology

## 2020-05-10 ENCOUNTER — Telehealth (INDEPENDENT_AMBULATORY_CARE_PROVIDER_SITE_OTHER): Payer: Medicare PPO | Admitting: Internal Medicine

## 2020-05-10 DIAGNOSIS — M810 Age-related osteoporosis without current pathological fracture: Secondary | ICD-10-CM

## 2020-05-10 MED ORDER — ALENDRONATE SODIUM 70 MG PO TABS: 70.0000 mg | ORAL_TABLET | ORAL | 3 refills | Status: DC

## 2020-05-10 NOTE — Progress Notes (Signed)
Virtual Visit via Telephone Note  I connected with Madison Glover on 05/10/20 at  4:00 PM EDT by telephone and verified that I am speaking with the correct person using two identifiers.   I discussed the limitations, risks, security and privacy concerns of performing an evaluation and management service by telephone and the availability of in person appointments. I also discussed with the patient that there may be a patient responsible charge related to this service. The patient expressed understanding and agreed to proceed.  Location patient: home Location provider: work office Participants present for the call: patient, provider Patient did not have a visit in the prior 7 days to address this/these issue(s).   History of Present Illness:  We have scheduled this visit to discuss the results of her recent DEXA scan.  It was significant for osteoporosis with a lumbar spine T score of -2.9, -3.2 of the right femoral neck and -3.0 of the left femoral neck.  She is doing well and has no acute complaints.   Observations/Objective: Patient sounds cheerful and well on the phone. I do not appreciate any increased work of breathing. Speech and thought processing are grossly intact. Patient reported vitals: None reported   Current Outpatient Medications:  .  citalopram (CELEXA) 20 MG tablet, Take 1 tablet (20 mg total) by mouth daily., Disp: 90 tablet, Rfl: 1 .  donepezil (ARICEPT) 10 MG tablet, Take 1 tablet (10 mg total) by mouth at bedtime., Disp: 30 tablet, Rfl: 5 .  hydrochlorothiazide (HYDRODIURIL) 25 MG tablet, Take 1 tablet (25 mg total) by mouth daily., Disp: 90 tablet, Rfl: 1 .  memantine (NAMENDA) 10 MG tablet, TAKE 1 TABLET BY MOUTH TWICE A DAY, Disp: 180 tablet, Rfl: 1 .  Metoprolol Succinate 100 MG CS24, Take 100 mg by mouth daily., Disp: 90 capsule, Rfl: 1 .  simvastatin (ZOCOR) 20 MG tablet, Take 1 tablet (20 mg total) by mouth daily at 6 PM., Disp: 90 tablet, Rfl: 1 .   vitamin B-12 (CYANOCOBALAMIN) 1000 MCG tablet, Take 1,000 mcg by mouth daily., Disp: , Rfl:  .  alendronate (FOSAMAX) 70 MG tablet, Take 1 tablet (70 mg total) by mouth every 7 (seven) days. Take with a full glass of water on an empty stomach., Disp: 4 tablet, Rfl: 3 .  aspirin EC 81 MG tablet, Take 81 mg by mouth daily. Swallow whole. (Patient not taking: Reported on 05/10/2020), Disp: , Rfl:   Review of Systems:  Constitutional: Denies fever, chills, diaphoresis, appetite change and fatigue.  HEENT: Denies photophobia, eye pain, redness, hearing loss, ear pain, congestion, sore throat, rhinorrhea, sneezing, mouth sores, trouble swallowing, neck pain, neck stiffness and tinnitus.   Respiratory: Denies SOB, DOE, cough, chest tightness,  and wheezing.   Cardiovascular: Denies chest pain, palpitations and leg swelling.  Gastrointestinal: Denies nausea, vomiting, abdominal pain, diarrhea, constipation, blood in stool and abdominal distention.  Genitourinary: Denies dysuria, urgency, frequency, hematuria, flank pain and difficulty urinating.  Endocrine: Denies: hot or cold intolerance, sweats, changes in hair or nails, polyuria, polydipsia. Musculoskeletal: Denies myalgias, back pain, joint swelling, arthralgias and gait problem.  Skin: Denies pallor, rash and wound.  Neurological: Denies dizziness, seizures, syncope, weakness, light-headedness, numbness and headaches.  Hematological: Denies adenopathy. Easy bruising, personal or family bleeding history  Psychiatric/Behavioral: Denies suicidal ideation, mood changes, confusion, nervousness, sleep disturbance and agitation   Assessment and Plan:  Age-related osteoporosis without current pathological fracture -After discussing results of recent DEXA scan, potential treatment options, she  has decided to start Fosamax 70 mg weekly.  She understands potential side effects would include osteonecrosis of the jaw and pill-induced esophagitis.  She knows  she needs to take this medication with a tall glass of water and sit/stand for an hour afterwards. -Repeat DEXA scan in 2 years.   I discussed the assessment and treatment plan with the patient. The patient was provided an opportunity to ask questions and all were answered. The patient agreed with the plan and demonstrated an understanding of the instructions.   The patient was advised to call back or seek an in-person evaluation if the symptoms worsen or if the condition fails to improve as anticipated.  I provided 23 minutes of non-face-to-face time during this encounter.   Madison Jan, MD [facility]

## 2020-05-10 NOTE — Assessment & Plan Note (Signed)
DEXA 10/21: LS -2.9 RFN -3.2 LFN -3.0

## 2020-05-23 ENCOUNTER — Encounter: Payer: Self-pay | Admitting: Gastroenterology

## 2020-05-29 ENCOUNTER — Encounter: Payer: Self-pay | Admitting: Psychology

## 2020-05-29 ENCOUNTER — Other Ambulatory Visit: Payer: Self-pay

## 2020-05-29 ENCOUNTER — Encounter: Payer: Medicare PPO | Attending: Psychology | Admitting: Psychology

## 2020-05-29 DIAGNOSIS — F339 Major depressive disorder, recurrent, unspecified: Secondary | ICD-10-CM

## 2020-05-29 DIAGNOSIS — R413 Other amnesia: Secondary | ICD-10-CM | POA: Diagnosis not present
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
## 2020-06-01 ENCOUNTER — Ambulatory Visit: Payer: Medicare PPO | Attending: Internal Medicine

## 2020-06-01 DIAGNOSIS — Z23 Encounter for immunization: Secondary | ICD-10-CM

## 2020-06-01 NOTE — Progress Notes (Signed)
   Covid-19 Vaccination Clinic  Name:  Madison Glover    MRN: [id number] DOB: 09-27-1950  06/01/2020  [name] was observed post Covid-19 immunization for 15 minutes without incident. She was provided with Vaccine Information Sheet and instruction to access the V-Safe system.   [name] was instructed to call 911 with any severe reactions post vaccine: Marland Kitchen Difficulty breathing  . Swelling of face and throat  . A fast heartbeat  . A bad rash all over body  . Dizziness and weakness

## 2020-06-15 ENCOUNTER — Telehealth: Payer: Self-pay

## 2020-06-15 ENCOUNTER — Other Ambulatory Visit: Payer: Self-pay

## 2020-06-15 ENCOUNTER — Encounter: Payer: Self-pay | Admitting: Gastroenterology

## 2020-06-15 ENCOUNTER — Ambulatory Visit (AMBULATORY_SURGERY_CENTER): Payer: Self-pay

## 2020-06-15 VITALS — Ht 59.0 in | Wt 140.6 lb

## 2020-06-15 DIAGNOSIS — Z1211 Encounter for screening for malignant neoplasm of colon: Secondary | ICD-10-CM

## 2020-06-15 MED ORDER — NA SULFATE-K SULFATE-MG SULF 17.5-3.13-1.6 GM/177ML PO SOLN: 1.0000 | Freq: Once | ORAL | 0 refills | Status: AC

## 2020-06-15 NOTE — Progress Notes (Signed)
Denies allergies to eggs or soy products. Denies complication of anesthesia or sedation. Denies use of weight loss medication. Denies use of O2.   Emmi instructions given for colonoscopy.   Patient completed Covid vaccinations . A copy of the patients instructions was given to the patients care partner Danella Sensing. Patient has trouble retaining information. Instructions were reviewed numerous times. I did suggest that someone stay with the patient and help her with the instructions.

## 2020-06-15 NOTE — Telephone Encounter (Signed)
I called Madison Glover's care partner and relayed Dr. Christella Hartigan message. Danella Sensing agreed that the office visit is a good idea as she had reservations as to whether Crista could follow the instructions. Procedure was cancelled per Dr. Christella Hartigan and the office visit is scheduled for 08/15/19 @ 1:30 Pm. Patient is aware that she is to arrive 15 minutes early.

## 2020-06-15 NOTE — Telephone Encounter (Signed)
Dr. Christella Hartigan,  I saw Madison Glover in Pre-Visit today. She is a direct colonoscopy screening. The patient has memory loss and is on aracept. The patient was accompanied by a friend/care partner. This patient still lives alone but receives assistance during the day. I reviewed the instructions numerous times and the patient was not understanding the instructions. I printed a copy of the instructions for the friend. 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 I did suggest that someone stay with the patient the day before her procedure to ensure that she doesn't eat. I am not sure what the the family will decide. Every effort was made to instruct and advise the patient and care partner. The patient is scheduled for 06/27/20. Please advise if the patient should proceed. She has never had a colonoscopy.   [name], LPN ( [facility] )

## 2020-06-15 NOTE — Telephone Encounter (Signed)
Sounds like colonoscopy might be pretty difficult for her to undergo.  Can you please cancel her upcoming procedure and instead schedule her for an office visit with me to discuss other options for colon cancer screening.  Thanks

## 2020-06-18 ENCOUNTER — Encounter: Payer: Medicare PPO | Admitting: Psychology

## 2020-06-26 ENCOUNTER — Encounter: Payer: Medicare PPO | Admitting: Psychology

## 2020-06-27 ENCOUNTER — Encounter: Payer: Medicare PPO | Admitting: Gastroenterology

## 2020-06-29 ENCOUNTER — Other Ambulatory Visit: Payer: Self-pay

## 2020-06-29 ENCOUNTER — Ambulatory Visit: Payer: Medicare PPO | Admitting: Psychology

## 2020-06-29 ENCOUNTER — Encounter: Payer: Self-pay | Admitting: Psychology

## 2020-06-29 ENCOUNTER — Encounter: Payer: Medicare PPO | Attending: Psychology | Admitting: Psychology

## 2020-06-29 DIAGNOSIS — R413 Other amnesia: Secondary | ICD-10-CM | POA: Insufficient documentation

## 2020-06-29 DIAGNOSIS — F0390 Unspecified dementia without behavioral disturbance: Secondary | ICD-10-CM | POA: Insufficient documentation

## 2020-06-29 NOTE — Progress Notes (Signed)
   Neuropsychology Note  Madison Glover completed 120 minutes of neuropsychological testing with this provider. The patient did not appear overtly distressed by the testing session, per behavioral observation or via self-report. Rest breaks were offered. She repeated herself often and required much repetition. She brought reading glasses. Effort and motivation were fair. She exhibited poor distress tolerance on questions she did not know or tasks that were more difficult; easily agitated and gave up easy. She was mostly cooperative with all assigned tasks; required some encouragement and additional prompting to obtain responses she was unsure of or believed she would answer incorrectly. She appeared impulsive, perseverative, and highly defensive.   Tests Administered:  Animal Naming   [employer] (BNT)  Controlled Oral Word Association Test (COWAT)  Finger Tapping Test (FTT)  Grooved Pegboard   Modified [place] Card Sorting Test (M-WCST)  Repeatable Battery for the Assessment of Neuropsychological Status, Form A (RBANS-A)  Wide Range Achievement Test, 5th Edition (WRAT-5), Word Reading   Results: To be included when scoring is complete.   Plan: Madison Glover will return on 07/03/20 for interactive feedback session with this provider at which time her test performances, clinical impressions and treatment recommendations will be reviewed in detail. The patient understands she can contact our office should she require our assistance before this time.  Full report to follow.

## 2020-07-02 ENCOUNTER — Other Ambulatory Visit: Payer: Self-pay

## 2020-07-02 ENCOUNTER — Encounter (HOSPITAL_BASED_OUTPATIENT_CLINIC_OR_DEPARTMENT_OTHER): Payer: Medicare PPO | Admitting: Psychology

## 2020-07-02 ENCOUNTER — Encounter: Payer: Self-pay | Admitting: Psychology

## 2020-07-02 DIAGNOSIS — F0390 Unspecified dementia without behavioral disturbance: Secondary | ICD-10-CM

## 2020-07-02 NOTE — Progress Notes (Addendum)
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 salmon and other fatty fish, and also products made from flax seed and flax seed oil.  Alternately, dietary supplementation with fish oil capsules and flax seed oil capsules is a good way to boost one's level of omega-3 consumption.  It is important to check with your doctor before taking these supplements, especially if you take blood thinning medication.  . If you do not already do so, consider taking a quality multivitamin supplement under the guidance of your primary care physician.   . Consider keeping consumption of the following foods to a minimum: 1) foods made from white flour and white sugar; 2) artificial sweeteners; 3) deep-fried foods; 4) animal fat other than fish; 5) any foods containing "hydrogenated" or "trans" fats; 6) most other types of highly processed packaged/prepared foods.   16. Neuropsychological re-evaluation is recommended as needed to monitor treatment efficacy, to assist with the management of the patient (start or continue rehab or pharmacological therapy), to determine any clinical and  functional significance of brain abnormality over time, as well as to document any potential improvement or decline in cognitive functioning. Lastly, any follow-up testing will help delineate the specific cognitive basis of any new functional complaints. If you wish to make a follow-up appointment, please contact our office at [phone].  The following are several strategies that may help:  . Performance will generally be best in a structured, routine, and familiar environment, as opposed to situations involving complex problems.  Marlene Lard a place to keep your keys, wallet, cell phone, and other personal belongings. . Take time to register and process information to be remembered. Deeper encoding of information can be gained by forming a mental picture, making meaningful associations, connecting new information to previously learned and related information, paraphrasing and repetition.  . To the extent  possible, multitasking should be avoided; break down tasks into smaller steps to help get started and to keep from feeling overwhelmed. And if there are difficulties in organization and planning, maintaining a daily organizer to help keep track of important appointments and information may be beneficial.   . Memory problems may at least be minimally addressed using compensatory strategies such as the use of a daily schedule to follow, memos, portable recorder, a centrally located bulletin board, or memory notebook. A large calendar, placed in a highly visible location would be valuable to keep track of dates and appointments.  In addition, it would be helpful to keep a log of all of medical appointments with the name of the doctor, date of visit, diagnoses, and treatments.  . Use of a medication box is recommended to ensure compliance and decrease confusion regarding medication dosages, times, and dates. . To aid in managing problems with attention, the patient may consider using some of the following  strategies: o The patient should simplify tasks.  There may be a need to break overly complex activities into simple step-by-step tasks, keep these steps written down in a note book and then check them off as they are completed which will help to stay on task and make sure the whole task is finished.  o The patient should set deadlines for everything, even for seemingly small tasks, prioritize time-sensitive tasks and write down every assignment, message, or important thought. o The patient is encouraged to use timers and alarms to stay on track and take breaks at regular intervals. Avoid piles of paperwork or procrastination by dealing with each item as it comes in.  Community resources are available regarding the care of adults with dementia. The [employer] has a website that offers tips and recommendations to aide families in caring for family members with dementia (even for those without Alzheimer's disease). VerifiedMovies.de.  In addition, a document that contains valuable resources for dementia care is provided.   Techniques for making living spaces safer:  . Make the stovetop and oven inaccessible by removing or covering the knobs . Secure all medications in a safe place . Unplug appliances when not in use . Reduce tripping hazards such as moving any cords out of the way . Could consider marking the edges of steps with colored tape . Install night lights throughout the house  Preventing fraudulent activity: individuals with Alzheimer's disease and other dementias may be at increased risk of being taken advantage of. Consider the following:  . Limit access to credit cards or cash . Could consider placing a sign on the front door such as "No Solicitations" . Could contact the [facility] "Do Not Call" list and add the person's number. They can be reached at [phone]  Techniques of communicating with a person who has dementia/memory impairment:  . Maintain  a regular schedule for as many activities as possible. . Practice reality orientation. . Repeat information quietly and firmly. Marland Kitchen Keep the voice low and calm and be rational and understanding. . Talk in a warm and encouraging manner. . Talk gently and calmly; smile and be relaxed. . Use normal voice tone, do not be condescending. . Talk in a quiet place without distraction. . Show respect and acceptance. . Use one sentence for each idea and check to see if the patient understands before proceeding. . Do not interrupt, signal acceptance of message by nods, or smiles when appropriate, and repeat what the patient has said when the message  is complete. Marland Kitchen Keep stress, arguing, disagreements, and verbal tirades to a minimum, as any additional stressor on the patient will cause continued and more rapid deterioration.  In general, the best way to manage the behavioral and psychological symptoms of dementia such as agitation involves behavioral strategies such as using the three R's.  o Redirection (help distract your loved one by focusing their attention on something else, moving them to a new environment, or otherwise engaging them in something other than what is distressing to them)  o Reassurance (reassure them that you are there to take care of them and that there is nothing they need to be worried about), and  o Reconsidering (consider the situation from their perspective and try to identify if there is something about the situation or environment that may be triggering their reaction).  Delusions (firmly held beliefs in things that are not real) may occur in middle-to-late-stage dementia. Confusion and memory loss -- such as the inability to remember certain people or objects -- can contribute to these untrue beliefs. A person with dementia may believe a family member is stealing his or her possessions or that he or she is being followed by the police. This kind of suspicious delusion is sometimes  referred to as paranoia. Although not grounded in reality, the situation is very real to the person with dementia. Keep in mind that a person with dementia is trying to make sense of his or her world with declining cognitive function. A delusion is not the same thing as a hallucination. While delusions involve false beliefs, hallucinations are false perceptions of objects or events that are sensory in nature. The following strategies may be helpful in responding to delusions:  o Don't take offense. Listen to what is troubling the person, and try to that reality. Then be reassuring, and let the person know you care.understand  o Don't argue or try to convince. Allow the individual to express ideas. Acknowledge his or her opinions. o Offer a simple answer. Share your thoughts with the individual, but keep it simple. Don't overwhelm the person with lengthy explanations or reasons. o Switch the focus to another activity. Engage the individual in an activity, or ask for help with a chore. o Duplicate any lost items. If the person is often searching for a specific item, have several available (if possible). For example, if the individual is always looking for his or her wallet, purchase two of the same kind.  Feedback to Patient: Dashley Monts Leonhard will return for a feedback appointment on 07/03/20 to review the results of her neuropsychological evaluation with this provider.  Thank you for your referral of Kaylise Blakeley Pennix. If you have any questions, please contact us at [phone].   This report is intended solely for the confidential review and use by the referring professional to assist in diagnostic and medical decision making needs.  This report should not be released to a third party without proper consent. [NOTE: data can be made available to qualified professionals with permission from the patient or legal representative/caregiver]    ____________________________________ Alfonso Ellis, PsyD,  Licensed Psychologist (Provisional) Clinical Neuropsychologist    Billing/Service Summary:   Neurobehavioral Status Exam:  Base: [phone] Add-on: [id number]  Direct clinical assessment (interview) of the patient and collateral interviews (as appropriate) by the licensed psychologist  Total time: 120 minutes       Total units:  1 1  Neuropsychological Testing Evaluation Services:   Base: [id number] Add-on: [phone]  Records review & clarify referral question; Patient symptom management; clinical decision making/battery modification; Integration/report generation; and, post-service work   Total time:  60 minutes  Total units:  1 0  Designer, fashion/clothing by Psychologist:  Base: [id number] Add-on: [phone]  Test Administration (face-to-face) Scoring (Non-face-to-face)                                                                                       Total time: 120 minutes      Total units:  1 3

## 2020-07-03 ENCOUNTER — Encounter: Payer: Medicare PPO | Admitting: Psychology

## 2020-07-03 ENCOUNTER — Other Ambulatory Visit: Payer: Self-pay

## 2020-07-03 DIAGNOSIS — F0390 Unspecified dementia without behavioral disturbance: Secondary | ICD-10-CM

## 2020-07-03 DIAGNOSIS — R413 Other amnesia: Secondary | ICD-10-CM | POA: Diagnosis not present

## 2020-07-04 ENCOUNTER — Encounter: Payer: Self-pay | Admitting: Psychology

## 2020-07-04 NOTE — Progress Notes (Signed)
Neuropsychology Feedback Appointment   Madison Glover and her longtime friend returned for a feedback appointment today to review preliminary results of her recent neuropsychological evaluation with this provider. 71 minutes face-to-face time was spent reviewing her test results, my impressions and my recommendations as detailed in her report. Education was provided about Alzheimer's disease and other causes of dementia.  Rational for additional testing was provided and both patient and her trusted confidant agreed with plan for another 2 hour session to more comprehensively assess adaptive function, language, attention and other frontal/executive abilities (e.g., judgement, reasoning, etc.), visuoperception, constructional praxis, and visual memory. The patient and her friend were given the opportunity to ask questions, and I did my best to answer these to their satisfaction. Written materials on Alzheimer's disease was provided.  We scheduled another 71 testing appointment for 07/31/19 to further aid in differential diagnosis and planning. Will update report upon completion. Below is a copy of the report from that formal evaluation, which can be found in EMR dated 07/02/20. The initial consultation progress note can be found in EMR on 71/08/2019. ___________________________________________________________________________________________________  Current Examination:  Behavioral Observations: Madison Nian Derrington completed 71 minutes of neuropsychological testing with this provider. She was appropriately dressed for season and situation and appeared tidy and well-groomed. Stature and height were unremarkable. Patient appeared well-nourished and chronological age. Sensory and motor abilities appeared normal. Patient was friendly and rapport was established. She brought reading glasses. The patient was able to understand test directions. Mood was anxious and affect was mood congruent. She appeared  impulsive, perseverative, and highly defensive. She exhibited poor distress tolerance on questions she did not know or tasks that were more difficult; easily agitated and gave up easy.  She appeared to be putting forth adequate effort on most tests, but was unable to provide any responses for delayed recall items on both verbal and nonverbal tests of memory. She repeated herself often and displayed poor insight and limited judgement. Optimal test taking conditions were maintained.  Orientation: Partial, incorrect date and day of week. Accurately named the current President and his predecessor.  Tests Administered:  Animal Naming   [place] (BNT)  Controlled Oral Word Association Test (COWAT)  Finger Tapping Test (FTT)  Grooved Pegboard   Hand Dynamometer   Modified [place] Card Sorting Test (M-WCST)  Repeatable Battery for the Assessment of Neuropsychological Status, Form A (RBANS-A)  Wide Range Achievement Test, 5th Edition (WRAT-5), Word Reading   Test Results: Note: Standardized scores are presented only for use by appropriately trained professionals and to allow for any future test-retest comparison. These scores should not be interpreted without consideration of all the information that is contained in the rest of the report. The most recent standardization samples from the test publisher or other sources were used whenever possible to derive standard scores; scores were corrected for age, gender, ethnicity and education when available.   TEST SCORES:  Note: This summary of test scores accompanies the interpretive report and should not be considered in isolation without reference to the appropriate sections in the text. Descriptors are based on appropriate normative data and may be adjusted based on clinical judgment. The terms "impaired" and "within normal limits (WNL)" are used when a more specific level of functioning cannot be determined.    Validity Testing:      Descriptor         RBANS Effort Index: --- --- Below Expectation         Cognitive Screening:  RBANS, Form A: Standard Score/ Scaled Score Percentile    Total Score 61 <1 Exceptionally Low  Immediate Memory 61 <1 Exceptionally Low  List Learning 3 1 Exceptionally Low  Story Memory 4 2 Well Below Average  Visuospatial/Constructional 78 7 Well Below Average  Figure Copy 10 50 Average  Line Orientation 7/20 <2 Exceptionally Low  Language 90 25 Average  Picture Naming 9/10 26-50 Average  Semantic Fluency 6 9 Below Average  Attention 75 5 Well Below Average  Digit Span 4 2 Well Below Average  Coding 8 25 Average  Delayed Memory 40 <1 Exceptionally Low  List Recall 0/10 <2 Exceptionally Low  List Recognition 11/20 <2 Exceptionally Low  Story Recall 1 <1 Exceptionally Low  Figure Recall 1 <1 Exceptionally Low         Intellectual Functioning:                 Standard Score Percentile    WRAT-4 Reading: 101 53 Average         Attention/Executive Function:               Modified [place] Card Sorting Test: Standard Score/T Score Percentile    Categories 30 <1 Exceptionally Low  Total Errors 26 1 Exceptionally Low  Perseverative Errors 29 2 Exceptionally Low  % Perseverative Errors 0.62 3 Well Below Average   Executive Functioning Composite 64 1 Exceptionally Low          Language:               Verbal Fluency Test: Raw Score (T Score) Percentile    Phonemic Fluency (FAS) 24 (32) 4 Well Below Average  Animal Fluency 12 (33) 5 Well Below Average           Raw Score (T Score) Percentile    [place] Naming Test (BNT): 52/60 (42) 21 Below Average         Visuospatial/Visuoconstruction:          Raw Score Percentile    Clock Drawing: 10/10 --- Average  RBANS Line Orientation, Form A: 7/20 <2 Exceptionally Low         Sensory-Motor:               [place] Grooved Pegboard Test: Raw Score Percentile    Dominant Hand 92 secs.,  0 drops  14 Below Average   Non-Dominant Hand 118 secs.,  1 drops  8 Well Below Average         Finger Tapping Test: Mean Percentile    Dominant Hand 53 92 Well Above Average  Non-Dominant Hand 45 79 Above Average         Hand Dynamometer  Mean Percentile    Dominant Hand 15 Kg - Average  Non-Dominant Hand 15 Kg - Average        Validity of the Evaluation:   While scores on certain embedded measures of performance validity were below expectation, they are consistent with collateral informant's subjective reports and the patient's constellation of symptoms. In addition, similar patterns of performance on measures herein are often reflected in individuals with genuine cognitive impairment.  Therefore, it is likely that performances below expected limits herein are more reflective of actual impairment than of suboptimal effort.  Lastly, there were no behavioral manifestations that suggested suboptimal effort. Thus, the current below findings are believed to be an accurate representation of the patient's current level of cognitive functioning.  Description of Test Results:   Premorbid verbal intellectual abilities were estimated  to have been within the average range based on a test of word reading. Orientation was below expectation as she was fully oriented to various aspects of self, situation, and place, but was disoriented to time and date. The patient was given a neuropsychological screening battery (RBANS), which contains 12 subtests covering five neuropsychological domains. Her total scale score on that battery, a composite of performance across tests and estimate of global cognitive functioning, was 61, which was exceptionally low for her age (<1st percentile). Psychomotor processing speed was average but she made several careless errors. Basic auditory attention was well below average. The patient's RBANS Visuospatial Index score of 78 was well below average for her age (8th percentile). Copy of a geometric figure (RBANS  Figure Copy) was average . Visuoperception (RBANS Line Orientation) was exceptionally low (<2nd percentile). The tone and prosody of the patient's speech were normal but fluency appeared reduced. She made some paraphasic errors and displayed mild-moderate word finding difficulties. Her RBANS Language Index score of 90 was at the low end of the average range for her age (35th  percentile). Confrontation naming (RBANS Picture Naming) was average whereas Category fluency was below average  She performed below average on another test of confrontation naming and well below average on another semantic fluency task.   The patient's RBANS Immediate Memory Index score of 61 was exceptionally low (<1st percentile). Immediate recall for a list of 10 words read to her 4 times (RBANS List Learning) was exceptionally low whereas recall for a story read 2 times (RBANS Story Memory) was well below average. The patient's RBANS Delayed Memory Index score of 40 was exceptionally low for her age (<1st percentile). Spontaneous recall for the list of words read to her after delay (RBANS List Recall) was exceptionally low (0 words, <1st percentile) and yes/no recognition for the words (RBANS List Recognition) was impaired and barely above chance (11/20, <2nd percentile). Delayed recall for the story [facility] Story Recall) was exceptionally low (<2nd percentile). Spontaneous recall for the copied figure (RBANS Figure Recall) also scored exceptionally low (<2nd percentile). Executive functioning was impaired overall. Verbal fluency with phonemic search restrictions was well below average. Performance on a clock drawing task was intact. There was mild relative weakness in fine motor control and dexterity in non-dominant hand compared to dominant side.   Clinical Impressions: Gabi Hamberger is a 69 y.o. female who is referred for neuropsychological evaluation by Butler Denmark, NP of [facility] due to concerns about her  memory and possibility of an underlying neurodegenerative disease process. Current overall cognitive status was found to be severely impaired and significantly below estimates of her baseline functioning. Her cognitive profile is notable for prominent impairment in learning and memory (e.g., acquisition, storage, and retrieval), spatial judgement, auditory attention, and executive functioning.  Language was relatively weak overall but not impaired. Information processing speed was intact but negatively impacted by inattention and careless errors. Behavioral observations were notable for impulsivity, perseveration, and moderate-severe inattention. She displayed rapid forgetting of information and repeated herself frequently. She had difficulty remembering test instructions despite appearing alert and interested.   Results of neuropsychological testing do suggest presence of significant neurocognitive decline compared to estimates of baseline functioning and appear inconsistent with normal aging. Additionally, there is evidence that her cognitive deficits are interfering with her ability to manage complex tasks, such as managing finances and cooking. As such, diagnostic criteria for a dementia syndrome are met.  Recent brain imaging showed generalized atrophy and chronic small  vessel ischemic changes, particularly around the periventricular area, suggesting possibility for vascular involvement; her medical history includes hypertension. It is possible that these factors are playing somewhat of a role but to a less degree given relatively preserved processing speed. While severe inattention and executive dysfunction likely interfere with her ability to adequately encode information, the pattern on testing suggests deficient storage as well, which provides support for the presence of an underlying neurodegenerative disease process.  As such, Alzheimer's disease appears more likely given her cognitive profile, clinical  features (including onset and course), and test scores from this evaluation.    With regards to her psychological functioning, the patient reported longstanding conflict with her 2 adopted daughters that have significantly taken a toll on her mood and levels of stress for many years; previously diagnosed with recurrent depression. The severity of her cognitive deficits in multiple cognitive domains are generally atypical of impairment solely secondary to depression or other psychological condition. While her recurrent depression, anxiety, and stress could be contributing to her reduced cognitive functioning, her neuropsychological profile is concerning for an organic process.   Additional testing is warranted to further assess frontal/executive, visual memory, and visuoperceptual abilities to further assist with differential diagnosis and broaden baseline comparative data; this will improve future evaluations and help with management over time. This will be discussed during scheduled feedback appointment with patient on 07/03/20. Symptoms of depression continue to be present with fluctuating subjective intensity, and she appears to benefit from support, reminders, and focus on happier topics in the moment.  She ruminates about remote and recent conflict with her adopted daughters and becomes severely agitated. She wants to mend relationship but is reportedly met with resistance and extreme coldness.   Ms. Lonzo enjoys attending church and related activities and is highly encouraged to continue participating with friends and/or family that understand her current challenges. She should continue to stay active socially, physically, and mentally active. Continuous activity and responsibility can provide a sense of purpose in life, facilitate maintenance of independence, and improve overall quality of life. It is important that such tasks take into consideration both strengths and weaknesses.   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 Follow-up with Dr. Olegario Messier. . Continue treatment with cholinesterase inhibitor (I.e., Aricept) and NMDA antagonist (I.e.,memantine) but with assistance due to credible informant report that she has not been taking many (if not all) medications. . If agitation worsens, or begins to significantly affect daily life, then consider Nuedexta.  . While the patient had a recent MRI that showed generalized atrophy and multiple periventricular and subcortical foci of T2 hyperintensities (e.g., likely chronic small vessel ischemic disease, other types of imaging, such as PET or SPECT scans may be useful to measure activity in the brain by monitoring blood flow and glucose/oxygen usage. Lumbar puncture can also utilized to assess for amyloid beta and tau proteins in cerebrospinal fluid, though this procedure is invasive and adds marginal additive value over clinical diagnosis.  . Given recent history of vitamin B12 deficiency and Consider ordering labs to assess for other possible etiologies of cognitive disruption (e.g., TSH, methylmalonic acid, homocysteine, RPR, folate, vitamin D)   2. The [facility] Counsel[facility] Program at the [facility] in [place] provides counseling services for individuals diagnosed with mild cognitive impairment, Alzheimer's disease, or another form of dementia, as well as to their family  members. Services include individual, couple, and family counseling, as well as support groups, all of which provide a safe environment to talk about the journey with mild cognitive impairment or dementia, learn as much as possible about the disease, problem solve some of the common challenges encountered with memory and cognitive loss, and strengthen relationships. For more information,  they can be contacted at [phone].   3. The patient's family may wish to attend a local dementia caregiver support group and/or seek additional information from the Alzheimer's Association ([employer]). They may wish to seek additional resources through [employer] (contact information was provided).  4. Due to the nature and severity of the symptoms noted during this evaluation, it is recommended that the patient remain under AT LEAST partial supervision (this may change to 24-hour care with continued observation assessment and discussions with medical team and family/close friends ), as the cognitive deficits noted represent a safety risk if left alone for extended periods of time. She has displayed several unsafe behaviors in the last year per confidants and family report that have caused significant concern about her general safety; this is expected to worsen over time.     5. She requires assistance with many activities of daily living (e.g., food prep, cleaning, medication and financial management). Will work with medical team and family to determine appropriate fit for home health services and/or other long term care options. Sand has displayed several    6. Given the family reports of episodes of poor judgment resulting in possible safety issues, there may be benefit from a home safety evaluation with someone such as an occupational therapist.   7. It is recommended that the severity of the patient's impairments is considered when assessing the level of asset management required.   8. The patient should refrain from driving, as deficits noted on testing could affect one's ability to safely operate a motor vehicle. Her drivers license has reportedly been revoked by the [facility] per her daughter's request several months ago. A  formal driving evaluation is not recommended at this time.   9. It may be beneficial to contact the [employer] [facility] in [place] to  find alternative methods of transportation and identify other services that may be beneficial for the patient now or in the future. They can be reached at [phone].  10. Regular medical care is important for an individual with dementia. Therefore, make sure to maintain regular appointments with all medical providers. In addition, schedule these appointments during the patient's best time of day.  11. Reading the 36-Hour Day by Freddi Che and Rabins may be helpful in providing educational support for family members.  In addition, attending a dementia support group may help everyone share some of their feelings with others who are encountering similar problems.  12. [employer] is a link is to The Caregiver's Handbook: A comprehensive guide with tools, resources, and in-depth solutions to some of caregiving's toughest challenges.   13. The patient should wear identification at all times, in case the individual becomes lost and disoriented.   14. The patient is encouraged to attend to lifestyle factors for brain health (e.g., regular physical exercise, good nutrition habits, regular participation in cognitively-stimulating activities, and general stress management techniques), which are likely to have benefits for both emotional adjustment and cognition.  In fact, in addition to promoting general good health, regular exercise incorporating aerobic activities (e.g., brisk walking, jogging, bicycling, etc.) has been demonstrated to be a very effective treatment  for depression and stress, with similar efficacy rates to both antidepressant medication and psychotherapy. And for those with orthopedic issues, water aerobics may be particularly beneficial.  15. Nutritional factors can have a significant effect on psychological and emotional status, as well as overall brain functioning.  The following general recommendations have been associated with improvements in  depression and other psychological symptoms, as well as lower risk for dementia and other forms of cognitive impairment.  Please discuss these recommendations with your physician and/or dietitian before initiating:  . Consume a wide variety of fresh fruits and vegetables, particularly including brightly colored items such as berries, oranges, tomatoes, peppers, carrots, broccoli, spinach, dark green lettuces, sweet potatoes, etc., all of which are high in vitamins and antioxidants.  . Consume foods that are high in fiber, such as legumes (e.g., beans, peas, lentils) and foods made from whole grains (e.g., whole wheat bread and pasta)  . Consume a significant amount of omega-3 essential fats and oils.  These can be found in natural food sources such as salmon and other fatty fish, and also products made from flax seed and flax seed oil.  Alternately, dietary supplementation with fish oil capsules and flax seed oil capsules is a good way to boost one's level of omega-3 consumption.  It is important to check with your doctor before taking these supplements, especially if you take blood thinning medication.  . If you do not already do so, consider taking a quality multivitamin supplement under the guidance of your primary care physician.   . Consider keeping consumption of the following foods to a minimum: 1) foods made from white flour and white sugar; 2) artificial sweeteners; 3) deep-fried foods; 4) animal fat other than fish; 5) any foods containing "hydrogenated" or "trans" fats; 6) most other types of highly processed packaged/prepared foods.   16. Neuropsychological re-evaluation is recommended as needed to monitor treatment efficacy, to assist with the management of the patient (start or continue rehab or pharmacological therapy), to determine any clinical and functional significance of brain abnormality over time, as well as to document any potential improvement or decline in cognitive functioning.  Lastly, any follow-up testing will help delineate the specific cognitive basis of any new functional complaints. If you wish to make a follow-up appointment, please contact our office at [phone].  The following are several strategies that may help:  . Performance will generally be best in a structured, routine, and familiar environment, as opposed to situations involving complex problems.  Marlene Lard a place to keep your keys, wallet, cell phone, and other personal belongings. . Take time to register and process information to be remembered. Deeper encoding of information can be gained by forming a mental picture, making meaningful associations, connecting new information to previously learned and related information, paraphrasing and repetition.  . To the extent possible, multitasking should be avoided; break down tasks into smaller steps to help get started and to keep from feeling overwhelmed. And if there are difficulties in organization and planning, maintaining a daily organizer to help keep track of important appointments and information may be beneficial.   . Memory problems may at least be minimally addressed using compensatory strategies such as the use of a daily schedule to follow, memos, portable recorder, a centrally located bulletin board, or memory notebook. A large calendar, placed in a highly visible location would be valuable to keep track of dates and appointments.  In addition, it would be helpful to keep a log of all of  medical appointments with the name of the doctor, date of visit, diagnoses, and treatments.  . Use of a medication box is recommended to ensure compliance and decrease confusion regarding medication dosages, times, and dates. . To aid in managing problems with attention, the patient may consider using some of the following strategies: o The patient should simplify tasks.  There may be a need to break overly complex activities into simple step-by-step tasks, keep  these steps written down in a note book and then check them off as they are completed which will help to stay on task and make sure the whole task is finished.  o The patient should set deadlines for everything, even for seemingly small tasks, prioritize time-sensitive tasks and write down every assignment, message, or important thought. o The patient is encouraged to use timers and alarms to stay on track and take breaks at regular intervals. Avoid piles of paperwork or procrastination by dealing with each item as it comes in.  Community resources are available regarding the care of adults with dementia. The Alzheimer's Association has a website that offers tips and recommendations to aide families in caring for family members with dementia (even for those without Alzheimer's disease). [email or web address].  In addition, a document that contains valuable resources for dementia care is provided.   Techniques for making living spaces safer:  . Make the stovetop and oven inaccessible by removing or covering the knobs . Secure all medications in a safe place . Unplug appliances when not in use . Reduce tripping hazards such as moving any cords out of the way . Could consider marking the edges of steps with colored tape . Install night lights throughout the house  Preventing fraudulent activity: individuals with Alzheimer's disease and other dementias may be at increased risk of being taken advantage of. Consider the following:  . Limit access to credit cards or cash . Could consider placing a sign on the front door such as "No Solicitations" . Could contact the [place] "Do Not Call" list and add the person's number. They can be reached at [phone]  Techniques of communicating with a person who has dementia/memory impairment:  . Maintain a regular schedule for as many activities as possible. . Practice reality orientation. . Repeat information quietly and firmly. Marland Kitchen Keep the  voice low and calm and be rational and understanding. . Talk in a warm and encouraging manner. . Talk gently and calmly; smile and be relaxed. . Use normal voice tone, do not be condescending. . Talk in a quiet place without distraction. . Show respect and acceptance. . Use one sentence for each idea and check to see if the patient understands before proceeding. . Do not interrupt, signal acceptance of message by nods, or smiles when appropriate, and repeat what the patient has said when the message is complete. Marland Kitchen Keep stress, arguing, disagreements, and verbal tirades to a minimum, as any additional stressor on the patient will cause continued and more rapid deterioration.  In general, the best way to manage the behavioral and psychological symptoms of dementia such as agitation involves behavioral strategies such as using the three R's.  o Redirection (help distract your loved one by focusing their attention on something else, moving them to a new environment, or otherwise engaging them in something other than what is distressing to them)  o Reassurance (reassure them that you are there to take care of them and that there is nothing they need to be worried about), and  o Reconsidering (consider the situation from their perspective and try to identify if there is something about the situation or environment that may be triggering their reaction).  Delusions (firmly held beliefs in things that are not real) may occur in middle-to-late-stage dementia. Confusion and memory loss -- such as the inability to remember certain people or objects -- can contribute to these untrue beliefs. A person with dementia may believe a family member is stealing his or her possessions or that he or she is being followed by the police. This kind of suspicious delusion is sometimes referred to as paranoia. Although not grounded in reality, the situation is very real to the person with dementia. Keep in mind that a person  with dementia is trying to make sense of his or her world with declining cognitive function. A delusion is not the same thing as a hallucination. While delusions involve false beliefs, hallucinations are false perceptions of objects or events that are sensory in nature. The following strategies may be helpful in responding to delusions:  o Don't take offense. Listen to what is troubling the person, and try to that reality. Then be reassuring, and let the person know you care.understand  o Don't argue or try to convince. Allow the individual to express ideas. Acknowledge his or her opinions. o Offer a simple answer. Share your thoughts with the individual, but keep it simple. Don't overwhelm the person with lengthy explanations or reasons. o Switch the focus to another activity. Engage the individual in an activity, or ask for help with a chore. o Duplicate any lost items. If the person is often searching for a specific item, have several available (if possible). For example, if the individual is always looking for his or her wallet, purchase two of the same kind.  Feedback to Patient: Madison Glover will return for a feedback appointment on 07/03/20 to review the results of her neuropsychological evaluation with this provider.  Thank you for your referral of Haevyn Ury Mohammad. If you have any questions, please contact us at [phone].   This report is intended solely for the confidential review and use by the referring professional to assist in diagnostic and medical decision making needs.  This report should not be released to a third party without proper consent. [NOTE: data can be made available to qualified professionals with permission from the patient or legal representative/caregiver]    ____________________________________ Alfonso Ellis, PsyD,  Licensed Psychologist (Provisional) Clinical Neuropsychologist

## 2020-07-30 ENCOUNTER — Encounter: Payer: Medicare PPO | Admitting: Psychology

## 2020-07-31 ENCOUNTER — Encounter: Payer: Self-pay | Admitting: Internal Medicine

## 2020-07-31 ENCOUNTER — Other Ambulatory Visit: Payer: Self-pay

## 2020-07-31 ENCOUNTER — Ambulatory Visit (INDEPENDENT_AMBULATORY_CARE_PROVIDER_SITE_OTHER): Payer: Medicare PPO | Admitting: Internal Medicine

## 2020-07-31 ENCOUNTER — Other Ambulatory Visit: Payer: Self-pay | Admitting: Internal Medicine

## 2020-07-31 VITALS — BP 130/80 | HR 60 | Temp 97.9°F | Ht 58.5 in | Wt 142.1 lb

## 2020-07-31 DIAGNOSIS — E559 Vitamin D deficiency, unspecified: Secondary | ICD-10-CM | POA: Insufficient documentation

## 2020-07-31 DIAGNOSIS — I10 Essential (primary) hypertension: Secondary | ICD-10-CM | POA: Diagnosis not present

## 2020-07-31 DIAGNOSIS — M810 Age-related osteoporosis without current pathological fracture: Secondary | ICD-10-CM

## 2020-07-31 DIAGNOSIS — E7800 Pure hypercholesterolemia, unspecified: Secondary | ICD-10-CM

## 2020-07-31 DIAGNOSIS — Z124 Encounter for screening for malignant neoplasm of cervix: Secondary | ICD-10-CM

## 2020-07-31 DIAGNOSIS — F339 Major depressive disorder, recurrent, unspecified: Secondary | ICD-10-CM | POA: Diagnosis not present

## 2020-07-31 DIAGNOSIS — Z1231 Encounter for screening mammogram for malignant neoplasm of breast: Secondary | ICD-10-CM | POA: Diagnosis not present

## 2020-07-31 DIAGNOSIS — Z1239 Encounter for other screening for malignant neoplasm of breast: Secondary | ICD-10-CM | POA: Diagnosis not present

## 2020-07-31 DIAGNOSIS — Z0000 Encounter for general adult medical examination without abnormal findings: Secondary | ICD-10-CM

## 2020-07-31 DIAGNOSIS — E538 Deficiency of other specified B group vitamins: Secondary | ICD-10-CM

## 2020-07-31 LAB — LIPID PANEL
Cholesterol: 230 mg/dL — ABNORMAL HIGH (ref 0–200)
HDL: 52.3 mg/dL (ref >39.00)
LDL Cholesterol: 149 mg/dL — ABNORMAL HIGH (ref 0–99)
NonHDL: 177.57
Total CHOL/HDL Ratio: 4
Triglycerides: 144 mg/dL (ref 0.0–149.0)
VLDL: 28.8 mg/dL (ref 0.0–40.0)

## 2020-07-31 LAB — COMPREHENSIVE METABOLIC PANEL
ALT: 9 U/L (ref 0–35)
AST: 11 U/L (ref 0–37)
Albumin: 4.4 g/dL (ref 3.5–5.2)
Alkaline Phosphatase: 44 U/L (ref 39–117)
BUN: 14 mg/dL (ref 6–23)
CO2: 32 mEq/L (ref 19–32)
Calcium: 9.4 mg/dL (ref 8.4–10.5)
Chloride: 102 mEq/L (ref 96–112)
Creatinine, Ser: 0.98 mg/dL (ref 0.40–1.20)
GFR: 57.95 mL/min — ABNORMAL LOW (ref >60.00)
Glucose, Bld: 95 mg/dL (ref 70–99)
Potassium: 4.2 mEq/L (ref 3.5–5.1)
Sodium: 139 mEq/L (ref 135–145)
Total Bilirubin: 0.7 mg/dL (ref 0.2–1.2)
Total Protein: 6.7 g/dL (ref 6.0–8.3)

## 2020-07-31 LAB — CBC WITH DIFFERENTIAL/PLATELET
Basophils Absolute: 0 10*3/uL (ref 0.0–0.1)
Basophils Relative: 0.8 % (ref 0.0–3.0)
Eosinophils Absolute: 0.1 10*3/uL (ref 0.0–0.7)
Eosinophils Relative: 2 % (ref 0.0–5.0)
HCT: 40.8 % (ref 36.0–46.0)
Hemoglobin: 13.3 g/dL (ref 12.0–15.0)
Lymphocytes Relative: 23.6 % (ref 12.0–46.0)
Lymphs Abs: 1.3 10*3/uL (ref 0.7–4.0)
MCHC: 32.6 g/dL (ref 30.0–36.0)
MCV: 90 fl (ref 78.0–100.0)
Monocytes Absolute: 0.5 10*3/uL (ref 0.1–1.0)
Monocytes Relative: 9.3 % (ref 3.0–12.0)
Neutro Abs: 3.6 10*3/uL (ref 1.4–7.7)
Neutrophils Relative %: 64.3 % (ref 43.0–77.0)
Platelets: 301 10*3/uL (ref 150.0–400.0)
RBC: 4.54 Mil/uL (ref 3.87–5.11)
RDW: 13.9 % (ref 11.5–15.5)
WBC: 5.6 10*3/uL (ref 4.0–10.5)

## 2020-07-31 LAB — HEMOGLOBIN A1C: Hgb A1c MFr Bld: 5.5 % (ref 4.6–6.5)

## 2020-07-31 LAB — TSH: TSH: 1.18 u[IU]/mL (ref 0.35–4.50)

## 2020-07-31 LAB — VITAMIN B12: Vitamin B-12: 936 pg/mL — ABNORMAL HIGH (ref 211–911)

## 2020-07-31 LAB — VITAMIN D 25 HYDROXY (VIT D DEFICIENCY, FRACTURES): VITD: 27.01 ng/mL — ABNORMAL LOW (ref 30.00–100.00)

## 2020-07-31 MED ORDER — VITAMIN D (ERGOCALCIFEROL) 1.25 MG (50000 UNIT) PO CAPS: 50000.0000 [IU] | ORAL_CAPSULE | ORAL | 0 refills | Status: DC

## 2020-07-31 NOTE — Progress Notes (Signed)
Established Patient Office Visit     This visit occurred during the SARS-CoV-2 public health emergency.  Safety protocols were in place, including screening questions prior to the visit, additional usage of staff PPE, and extensive cleaning of exam room while observing appropriate contact time as indicated for disinfecting solutions.    CC/Reason for Visit: Annual preventive exam and subsequent Medicare wellness visit  HPI: Madison Glover is a 70 y.o. female who is coming in today for the above mentioned reasons. Past Medical History is significant for: Hypertension, hyperlipidemia, history of dementia/short-term memory loss as well as osteoporosis.  She has no acute complaints today.  She has routine eye and dental care, no perceived hearing issues, she walks about a mile twice a week and that is the extent of her physical activity.  Her vaccinations are updated with the exception of shingles.  She has her GI appointment in January for consideration of a colonoscopy, she has never had colon cancer screening, she is overdue for mammogram as well as Pap smear.   Past Medical/Surgical History: Past Medical History:  Diagnosis Date  . Allergy   . Anxiety   . High cholesterol   . Hypertension   . Osteoporosis     No past surgical history on file.  Social History:  reports that she has never smoked. She has never used smokeless tobacco. She reports current alcohol use. She reports that she does not use drugs.  Allergies: No Known Allergies  Family History:  Family History  Problem Relation Age of Onset  . Cancer Mother   . COPD Father   . Heart disease Father   . Colon cancer Neg Hx   . Esophageal cancer Neg Hx   . Stomach cancer Neg Hx   . Rectal cancer Neg Hx      Current Outpatient Medications:  .  alendronate (FOSAMAX) 70 MG tablet, Take 1 tablet (70 mg total) by mouth every 7 (seven) days. Take with a full glass of water on an empty stomach., Disp: 4 tablet,  Rfl: 3 .  aspirin EC 81 MG tablet, Take 81 mg by mouth daily. Swallow whole. , Disp: , Rfl:  .  citalopram (CELEXA) 20 MG tablet, Take 1 tablet (20 mg total) by mouth daily., Disp: 90 tablet, Rfl: 1 .  donepezil (ARICEPT) 10 MG tablet, Take 1 tablet (10 mg total) by mouth at bedtime., Disp: 30 tablet, Rfl: 5 .  hydrochlorothiazide (HYDRODIURIL) 25 MG tablet, Take 1 tablet (25 mg total) by mouth daily., Disp: 90 tablet, Rfl: 1 .  memantine (NAMENDA) 10 MG tablet, TAKE 1 TABLET BY MOUTH TWICE A DAY, Disp: 180 tablet, Rfl: 1 .  Metoprolol Succinate 100 MG CS24, Take 100 mg by mouth daily., Disp: 90 capsule, Rfl: 1 .  simvastatin (ZOCOR) 20 MG tablet, Take 1 tablet (20 mg total) by mouth daily at 6 PM., Disp: 90 tablet, Rfl: 1 .  vitamin B-12 (CYANOCOBALAMIN) 1000 MCG tablet, Take 1,000 mcg by mouth daily., Disp: , Rfl:   Review of Systems:  Constitutional: Denies fever, chills, diaphoresis, appetite change and fatigue.  HEENT: Denies photophobia, eye pain, redness, hearing loss, ear pain, congestion, sore throat, rhinorrhea, sneezing, mouth sores, trouble swallowing, neck pain, neck stiffness and tinnitus.   Respiratory: Denies SOB, DOE, cough, chest tightness,  and wheezing.   Cardiovascular: Denies chest pain, palpitations and leg swelling.  Gastrointestinal: Denies nausea, vomiting, abdominal pain, diarrhea, constipation, blood in stool and abdominal distention.  Genitourinary: Denies  dysuria, urgency, frequency, hematuria, flank pain and difficulty urinating.  Endocrine: Denies: hot or cold intolerance, sweats, changes in hair or nails, polyuria, polydipsia. Musculoskeletal: Denies myalgias, back pain, joint swelling, arthralgias and gait problem.  Skin: Denies pallor, rash and wound.  Neurological: Denies dizziness, seizures, syncope, weakness, light-headedness, numbness and headaches.  Hematological: Denies adenopathy. Easy bruising, personal or family bleeding history   Psychiatric/Behavioral: Denies suicidal ideation, mood changes, confusion, nervousness, sleep disturbance and agitation    Physical Exam: Vitals:   07/31/20 1107  BP: 130/80  Pulse: 60  Temp: 97.9 F (36.6 C)  TempSrc: Oral  SpO2: 99%  Weight: 142 lb 1.6 oz (64.5 kg)  Height: 4' 10.5" (1.486 m)    Body mass index is 29.19 kg/m.   Constitutional: NAD, calm, comfortable Eyes: PERRL, lids and conjunctivae normal, wears corrective lenses ENMT: Mucous membranes are moist. Posterior pharynx clear of any exudate or lesions. Normal dentition. Tympanic membrane is pearly white, no erythema or bulging. Neck: normal, supple, no masses, no thyromegaly Respiratory: clear to auscultation bilaterally, no wheezing, no crackles. Normal respiratory effort. No accessory muscle use.  Cardiovascular: Regular rate and rhythm, no murmurs / rubs / gallops. No extremity edema. 2+ pedal pulses. No carotid bruits.  Abdomen: no tenderness, no masses palpated. No hepatosplenomegaly. Bowel sounds positive.  Musculoskeletal: no clubbing / cyanosis. No joint deformity upper and lower extremities. Good ROM, no contractures. Normal muscle tone.  Skin: no rashes, lesions, ulcers. No induration Neurologic: CN 2-12 grossly intact. Sensation intact, DTR normal. Strength 5/5 in all 4.  Psychiatric: Normal judgment and insight. Alert and oriented x 3. Normal mood.    Subsequent Medicare wellness visit   1. Risk factors, based on past  M,S,F -cardiovascular disease risk factors include age, history of hyperlipidemia, history of hypertension   2.  Physical activities: Walks about 2 miles a week   3.  Depression/mood:  Stable, not depressed   4.  Hearing:  No perceived issues   5.  ADL's: Independent in all ADLs   6.  Fall risk:  Low fall risk   7.  Home safety: No problems identified   8.  Height weight, and visual acuity: height and weight as above, vision:   Visual Acuity Screening   Right eye Left  eye Both eyes  Without correction:     With correction: na na 20/50     9.  Counseling:  Advise shingles vaccine at pharmacy, advised she increase her physical activity   10. Lab orders based on risk factors: Laboratory update will be reviewed   11. Referral :  GYN   12. Care plan:  Follow-up with me in 6 months   13. 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 Screening: Patient provided with a written and personalized 5-10 year screening schedule in the AVS.   yes   15. Provider List Update:   PCP, GI  16. Advance Directives: Full code   [place] Office Visit from 07/31/2020 in [facility]  PHQ-9 Total Score 0      Fall Risk  04/17/2020  Falls in the past year? 0  Number falls in past yr: 0  Injury with Fall? 0     Impression and Plan:  Encounter for preventive health examination  -She has routine eye and dental care. -She will obtain shingles vaccine at pharmacy, otherwise immunizations are up-to-date. -Screening labs today. -Healthy lifestyle discussed in detail  including need to increase physical activity. -Has appointment with GI for screening colonoscopy. -I will refer her to GYN as she is overdue for cervical cancer screening. -Mammogram ordered today. -She has a history of osteoporosis on alendronate and will be due for repeat DEXA in 2023.  Screening for cervical cancer  - Plan: Ambulatory referral to Gynecology  Encounter for screening mammogram for malignant neoplasm of breast  - Plan: MM Digital Screening  Vitamin B12 deficiency  - Plan: Vitamin B12  Primary hypertension -Well-controlled.  High cholesterol  - Plan: Lipid panel -Not currently on a statin but last LDL was 204 in 2020, suspect she will need one.  Depression, recurrent ([place]) -PHQ-9 score of 0 today.  She is on Celexa 20 mg daily  Age-related osteoporosis without current pathological  fracture -On weekly alendronate    Patient Instructions  -Nice seeing you today!!  -Lab work today; will notify you once results are available.  -Remember your shingles vaccination at your pharmacy.  -Mammogram and GYN referrals today.  -Schedule follow up in 6 months or sooner as needed.    Preventive Care 29 Years and Older, Female Preventive care refers to lifestyle choices and visits with your health care provider that can promote health and wellness. This includes:  A yearly physical exam. This is also called an annual well check.  Regular dental and eye exams.  Immunizations.  Screening for certain conditions.  Healthy lifestyle choices, such as diet and exercise. What can I expect for my preventive care visit? Physical exam Your health care provider will check:  Height and weight. These may be used to calculate body mass index (BMI), which is a measurement that tells if you are at a healthy weight.  Heart rate and blood pressure.  Your skin for abnormal spots. Counseling Your health care provider may ask you questions about:  Alcohol, tobacco, and drug use.  Emotional well-being.  Home and relationship well-being.  Sexual activity.  Eating habits.  History of falls.  Memory and ability to understand (cognition).  Work and work Statistician.  Pregnancy and menstrual history. What immunizations do I need?  Influenza (flu) vaccine  This is recommended every year. Tetanus, diphtheria, and pertussis (Tdap) vaccine  You may need a Td booster every 10 years. Varicella (chickenpox) vaccine  You may need this vaccine if you have not already been vaccinated. Zoster (shingles) vaccine  You may need this after age 36. Pneumococcal conjugate (PCV13) vaccine  One dose is recommended after age 42. Pneumococcal polysaccharide (PPSV23) vaccine  One dose is recommended after age 18. Measles, mumps, and rubella (MMR) vaccine  You may need at least  one dose of MMR if you were born in 1957 or later. You may also need a second dose. Meningococcal conjugate (MenACWY) vaccine  You may need this if you have certain conditions. Hepatitis A vaccine  You may need this if you have certain conditions or if you travel or work in places where you may be exposed to hepatitis A. Hepatitis B vaccine  You may need this if you have certain conditions or if you travel or work in places where you may be exposed to hepatitis B. Haemophilus influenzae type b (Hib) vaccine  You may need this if you have certain conditions. You may receive vaccines as individual doses or as more than one vaccine together in one shot (combination vaccines). Talk with your health care provider about the risks and benefits of combination vaccines. What tests do I need?  Blood tests  Lipid and cholesterol levels. These may be checked every 5 years, or more frequently depending on your overall health.  Hepatitis C test.  Hepatitis B test. Screening  Lung cancer screening. You may have this screening every year starting at age 89 if you have a 30-pack-year history of smoking and currently smoke or have quit within the past 15 years.  Colorectal cancer screening. All adults should have this screening starting at age 68 and continuing until age 49. Your health care provider may recommend screening at age 37 if you are at increased risk. You will have tests every 1-10 years, depending on your results and the type of screening test.  Diabetes screening. This is done by checking your blood sugar (glucose) after you have not eaten for a while (fasting). You may have this done every 1-3 years.  Mammogram. This may be done every 1-2 years. Talk with your health care provider about how often you should have regular mammograms.  BRCA-related cancer screening. This may be done if you have a family history of breast, ovarian, tubal, or peritoneal cancers. Other tests  Sexually  transmitted disease (STD) testing.  Bone density scan. This is done to screen for osteoporosis. You may have this done starting at age 58. Follow these instructions at home: Eating and drinking  Eat a diet that includes fresh fruits and vegetables, whole grains, lean protein, and low-fat dairy products. Limit your intake of foods with high amounts of sugar, saturated fats, and salt.  Take vitamin and mineral supplements as recommended by your health care provider.  Do not drink alcohol if your health care provider tells you not to drink.  If you drink alcohol: ? Limit how much you have to 0-1 drink a day. ? Be aware of how much alcohol is in your drink. In the [place] one drink equals one 12 oz bottle of beer (355 mL), one 5 oz glass of wine (148 mL), or one 1 oz glass of hard liquor (44 mL). Lifestyle  Take daily care of your teeth and gums.  Stay active. Exercise for at least 30 minutes on 5 or more days each week.  Do not use any products that contain nicotine or tobacco, such as cigarettes, e-cigarettes, and chewing tobacco. If you need help quitting, ask your health care provider.  If you are sexually active, practice safe sex. Use a condom or other form of protection in order to prevent STIs (sexually transmitted infections).  Talk with your health care provider about taking a low-dose aspirin or statin. What's next?  Go to your health care provider once a year for a well check visit.  Ask your health care provider how often you should have your eyes and teeth checked.  Stay up to date on all vaccines. This information is not intended to replace advice given to you by your health care provider. Make sure you discuss any questions you have with your health care provider. Document Revised: 07/08/2018 Document Reviewed: 07/08/2018 Elsevier Patient Education  2020 [place], [place] [facility]

## 2020-07-31 NOTE — Patient Instructions (Signed)
-Nice seeing you today!!  -Lab work today; will notify you once results are available.  -Remember your shingles vaccination at your pharmacy.  -Mammogram and GYN referrals today.  -Schedule follow up in 6 months or sooner as needed.    Preventive Care 72 Years and Older, Female Preventive care refers to lifestyle choices and visits with your health care provider that can promote health and wellness. This includes:  A yearly physical exam. This is also called an annual well check.  Regular dental and eye exams.  Immunizations.  Screening for certain conditions.  Healthy lifestyle choices, such as diet and exercise. What can I expect for my preventive care visit? Physical exam Your health care provider will check:  Height and weight. These may be used to calculate body mass index (BMI), which is a measurement that tells if you are at a healthy weight.  Heart rate and blood pressure.  Your skin for abnormal spots. Counseling Your health care provider may ask you questions about:  Alcohol, tobacco, and drug use.  Emotional well-being.  Home and relationship well-being.  Sexual activity.  Eating habits.  History of falls.  Memory and ability to understand (cognition).  Work and work Statistician.  Pregnancy and menstrual history. What immunizations do I need?  Influenza (flu) vaccine  This is recommended every year. Tetanus, diphtheria, and pertussis (Tdap) vaccine  You may need a Td booster every 10 years. Varicella (chickenpox) vaccine  You may need this vaccine if you have not already been vaccinated. Zoster (shingles) vaccine  You may need this after age 87. Pneumococcal conjugate (PCV13) vaccine  One dose is recommended after age 38. Pneumococcal polysaccharide (PPSV23) vaccine  One dose is recommended after age 2. Measles, mumps, and rubella (MMR) vaccine  You may need at least one dose of MMR if you were born in 1957 or later. You may also  need a second dose. Meningococcal conjugate (MenACWY) vaccine  You may need this if you have certain conditions. Hepatitis A vaccine  You may need this if you have certain conditions or if you travel or work in places where you may be exposed to hepatitis A. Hepatitis B vaccine  You may need this if you have certain conditions or if you travel or work in places where you may be exposed to hepatitis B. Haemophilus influenzae type b (Hib) vaccine  You may need this if you have certain conditions. You may receive vaccines as individual doses or as more than one vaccine together in one shot (combination vaccines). Talk with your health care provider about the risks and benefits of combination vaccines. What tests do I need? Blood tests  Lipid and cholesterol levels. These may be checked every 5 years, or more frequently depending on your overall health.  Hepatitis C test.  Hepatitis B test. Screening  Lung cancer screening. You may have this screening every year starting at age 50 if you have a 30-pack-year history of smoking and currently smoke or have quit within the past 15 years.  Colorectal cancer screening. All adults should have this screening starting at age 65 and continuing until age 39. Your health care provider may recommend screening at age 49 if you are at increased risk. You will have tests every 1-10 years, depending on your results and the type of screening test.  Diabetes screening. This is done by checking your blood sugar (glucose) after you have not eaten for a while (fasting). You may have this done every 1-3 years.  Mammogram. This may be done every 1-2 years. Talk with your health care provider about how often you should have regular mammograms.  BRCA-related cancer screening. This may be done if you have a family history of breast, ovarian, tubal, or peritoneal cancers. Other tests  Sexually transmitted disease (STD) testing.  Bone density scan. This is done  to screen for osteoporosis. You may have this done starting at age 23. Follow these instructions at home: Eating and drinking  Eat a diet that includes fresh fruits and vegetables, whole grains, lean protein, and low-fat dairy products. Limit your intake of foods with high amounts of sugar, saturated fats, and salt.  Take vitamin and mineral supplements as recommended by your health care provider.  Do not drink alcohol if your health care provider tells you not to drink.  If you drink alcohol: ? Limit how much you have to 0-1 drink a day. ? Be aware of how much alcohol is in your drink. In the [place] one drink equals one 12 oz bottle of beer (355 mL), one 5 oz glass of wine (148 mL), or one 1 oz glass of hard liquor (44 mL). Lifestyle  Take daily care of your teeth and gums.  Stay active. Exercise for at least 30 minutes on 5 or more days each week.  Do not use any products that contain nicotine or tobacco, such as cigarettes, e-cigarettes, and chewing tobacco. If you need help quitting, ask your health care provider.  If you are sexually active, practice safe sex. Use a condom or other form of protection in order to prevent STIs (sexually transmitted infections).  Talk with your health care provider about taking a low-dose aspirin or statin. What's next?  Go to your health care provider once a year for a well check visit.  Ask your health care provider how often you should have your eyes and teeth checked.  Stay up to date on all vaccines. This information is not intended to replace advice given to you by your health care provider. Make sure you discuss any questions you have with your health care provider. Document Revised: 07/08/2018 Document Reviewed: 07/08/2018 Elsevier Patient Education  2020 [employer].

## 2020-08-08 ENCOUNTER — Other Ambulatory Visit: Payer: Self-pay | Admitting: Internal Medicine

## 2020-08-08 DIAGNOSIS — E559 Vitamin D deficiency, unspecified: Secondary | ICD-10-CM

## 2020-08-14 ENCOUNTER — Ambulatory Visit: Payer: Medicare PPO | Admitting: Gastroenterology

## 2020-08-17 ENCOUNTER — Ambulatory Visit (INDEPENDENT_AMBULATORY_CARE_PROVIDER_SITE_OTHER): Payer: Medicare PPO | Admitting: Gastroenterology

## 2020-08-17 ENCOUNTER — Encounter: Payer: Self-pay | Admitting: Gastroenterology

## 2020-08-17 ENCOUNTER — Other Ambulatory Visit: Payer: Self-pay

## 2020-08-17 VITALS — BP 140/80 | HR 60 | Ht <= 58 in | Wt 142.2 lb

## 2020-08-17 DIAGNOSIS — Z1211 Encounter for screening for malignant neoplasm of colon: Secondary | ICD-10-CM | POA: Diagnosis not present

## 2020-08-17 NOTE — Progress Notes (Signed)
HPI: This is a very pleasant 70 year old woman who was referred to me by Philip Aspen, Estel*  to evaluate routine risk for colon cancer.    She has significant memory difficulties, especially short-term memory difficulties.  She has no troubles with her bowels.  No constipation, no bleeding, no significant diarrhea.  No abdominal pains.  Her weight is overall stable.  She is here today with a caregiver who knows her quite well.  Neither she nor her caregiver knows of any colon cancer in her family.  She is not on blood thinners.  She has never had colon cancer screening that they are aware of   Old Data Reviewed: Blood work January 2022 shows normal complete metabolic profile, normal CBC.     Review of systems: Pertinent positive and negative review of systems were noted in the above HPI section. All other review negative.   Past Medical History:  Diagnosis Date   Allergy    Anxiety    High cholesterol    Hypertension    Memory loss    Osteoporosis    Vitamin D deficiency     History reviewed. No pertinent surgical history.  Current Outpatient Medications  Medication Sig Dispense Refill   alendronate (FOSAMAX) 70 MG tablet TAKE 1 TABLET BY MOUTH EVERY 7 DAYS WITH A FULL GLASS OF WATER ON AN EMPTY STOMACH. 12 tablet 1   aspirin EC 81 MG tablet Take 81 mg by mouth daily. Swallow whole.      citalopram (CELEXA) 20 MG tablet Take 1 tablet (20 mg total) by mouth daily. 90 tablet 1   donepezil (ARICEPT) 10 MG tablet Take 1 tablet (10 mg total) by mouth at bedtime. 30 tablet 5   hydrochlorothiazide (HYDRODIURIL) 25 MG tablet Take 1 tablet (25 mg total) by mouth daily. 90 tablet 1   memantine (NAMENDA) 10 MG tablet TAKE 1 TABLET BY MOUTH TWICE A DAY 180 tablet 1   Metoprolol Succinate 100 MG CS24 Take 100 mg by mouth daily. 90 capsule 1   simvastatin (ZOCOR) 20 MG tablet Take 1 tablet (20 mg total) by mouth daily at 6 PM. 90 tablet 1   vitamin B-12  (CYANOCOBALAMIN) 1000 MCG tablet Take 1,000 mcg by mouth daily.     Vitamin D, Ergocalciferol, (DRISDOL) 1.25 MG (50000 UNIT) CAPS capsule Take 1 capsule (50,000 Units total) by mouth every 7 (seven) days for 12 doses. 12 capsule 0   No current facility-administered medications for this visit.    Allergies as of 08/17/2020 - Review Complete 08/17/2020  Allergen Reaction Noted   Bee venom Swelling 05/04/2020    Family History  Problem Relation Age of Onset   Breast cancer Mother    COPD Father    Heart disease Father    Hypertension Father    Cancer Brother        type unknown   Colon cancer Neg Hx    Esophageal cancer Neg Hx    Stomach cancer Neg Hx    Rectal cancer Neg Hx     Social History   Socioeconomic History   Marital status: Divorced    Spouse name: Not on file   Number of children: 0   Years of education: Not on file   Highest education level: Not on file  Occupational History   Occupation: retired  Tobacco Use   Smoking status: Former Smoker    Years: 4.00    Types: Cigarettes   Smokeless tobacco: Never Used   Tobacco  comment: on occasion at parties  Vaping Use   Vaping Use: Never used  Substance and Sexual Activity   Alcohol use: Yes    Comment: occasional wine   Drug use: Never   Sexual activity: Not on file  Other Topics Concern   Not on file  Social History Narrative   Lives at home, alone, retired.  Education: [employer]. 2 Adopted daughters.    Social Determinants of Health   Financial Resource Strain: Not on file  Food Insecurity: Not on file  Transportation Needs: Not on file  Physical Activity: Not on file  Stress: Not on file  Social Connections: Not on file  Intimate Partner Violence: Not on file   Physical Exam: Ht 4' 9.5" (1.461 m) Comment: height measured without shoes   Wt 142 lb 4 oz (64.5 kg)    BMI 30.25 kg/m  Constitutional: generally well-appearing Psychiatric: alert and oriented x3 Eyes:  extraocular movements intact Mouth: oral pharynx moist, no lesions Neck: supple no lymphadenopathy Cardiovascular: heart regular rate and rhythm Lungs: clear to auscultation bilaterally Abdomen: soft, nontender, nondistended, no obvious ascites, no peritoneal signs, normal bowel sounds Extremities: no lower extremity edema bilaterally Skin: no lesions on visible extremities   Assessment and plan: 70 y.o. female with significant short-term memory problems, routine risk for colon cancer  We discussed options for colon cancer screening including colonoscopy versus stool based tests.  She is very reluctant to go ahead with a colonoscopy because she does not think she will be able to follow the directions and with her short-term memory difficulties I think that truly could be the case.  Cologuard colon cancer screening test is a completely reasonable option and I recommended to her that that is probably the best way to go given her memory issues and her reluctance to try a colonoscopy at this point.  We will therefore set her up with Cologuard testing today.  She and her caregiver understand that if it is negative she would not need colon cancer screening for 3 years.  They also understand if it is positive then we would have to find a way to safely get her prepped for a diagnostic colonoscopy    Please see the "Patient Instructions" section for addition details about the plan.   Rob Bunting, MD [facility] 08/17/2020, 2:31 PM  Cc: Philip Aspen, Estel*  Total time on date of encounter was 40  minutes (this included time spent preparing to see the patient reviewing records; obtaining and/or reviewing separately obtained history; performing a medically appropriate exam and/or evaluation; counseling and educating the patient and family if present; ordering medications, tests or procedures if applicable; and documenting clinical information in the health record).

## 2020-08-17 NOTE — Patient Instructions (Signed)
If you are age 72 or older, your body mass index should be between 23-30. Your Body mass index is 30.25 kg/m. If this is out of the aforementioned range listed, please consider follow up with your Primary Care Provider.  Your provider has ordered Cologuard testing as an option for colon cancer screening. This is performed by [facility] and may be out of network with your insurance. PRIOR to completing the test, it is YOUR responsibility to contact your insurance about covered benefits for this test. Your out of pocket expense could be anywhere from $0.00 to $649.00.   When you call to check coverage with your insurer, please provide the following information:   -The ONLY provider of Cologuard is [facility], dispensing  - CPT code for Cologuard is [phone].  [facility] NPI # [id number]  -[facility] Tax ID # [id number]   We have already sent your demographic and insurance information to [facility] [employer] (phone number [phone]) and they should contact you within the next week regarding your test. If you have not heard from them within the next week, please call our office at [phone].  Thank you for entrusting me with your care and choosing [facility].  Dr Christella Hartigan

## 2020-08-31 DIAGNOSIS — Z1211 Encounter for screening for malignant neoplasm of colon: Secondary | ICD-10-CM | POA: Diagnosis not present

## 2020-08-31 DIAGNOSIS — Z1212 Encounter for screening for malignant neoplasm of rectum: Secondary | ICD-10-CM | POA: Diagnosis not present

## 2020-08-31 LAB — COLOGUARD: Cologuard: POSITIVE — AB

## 2020-08-31 NOTE — Progress Notes (Signed)
I have reviewed and agreed above plan. 

## 2020-09-06 ENCOUNTER — Ambulatory Visit: Payer: Medicare PPO | Admitting: Obstetrics & Gynecology

## 2020-09-06 ENCOUNTER — Other Ambulatory Visit: Payer: Self-pay

## 2020-09-06 ENCOUNTER — Encounter: Payer: Self-pay | Admitting: Obstetrics & Gynecology

## 2020-09-06 VITALS — BP 126/80 | Ht <= 58 in | Wt 143.0 lb

## 2020-09-06 DIAGNOSIS — Z01419 Encounter for gynecological examination (general) (routine) without abnormal findings: Secondary | ICD-10-CM

## 2020-09-06 DIAGNOSIS — M810 Age-related osteoporosis without current pathological fracture: Secondary | ICD-10-CM

## 2020-09-06 DIAGNOSIS — Z780 Asymptomatic menopausal state: Secondary | ICD-10-CM

## 2020-09-06 NOTE — Progress Notes (Signed)
Noura Purpura Krienke [birth year] [id number]   History:    70 y.o. G0 2 Divorced.  2 adopted daughters.    RP:  New patient presenting for annual gyn exam   HPI: Postmenopause, well on no HRT.  No PMB.  No pelvic pain.  Abstinent x many years.  Urine/BMs normal. Breasts normal.  Needs a screening mammo.  Cologard 08/2020.  Health labs with Fam MD.  BMI 30.94  Past medical history,surgical history, family history and social history were all reviewed and documented in the EPIC chart.  Gynecologic History No LMP recorded. Patient is postmenopausal.  Obstetric History OB History  Gravida Para Term Preterm AB Living  0 0 0 0 0 0  SAB IAB Ectopic Multiple Live Births  0 0 0 0 0     ROS: A ROS was performed and pertinent positives and negatives are included in the history.  GENERAL: No fevers or chills. HEENT: No change in vision, no earache, sore throat or sinus congestion. NECK: No pain or stiffness. CARDIOVASCULAR: No chest pain or pressure. No palpitations. PULMONARY: No shortness of breath, cough or wheeze. GASTROINTESTINAL: No abdominal pain, nausea, vomiting or diarrhea, melena or bright red blood per rectum. GENITOURINARY: No urinary frequency, urgency, hesitancy or dysuria. MUSCULOSKELETAL: No joint or muscle pain, no back pain, no recent trauma. DERMATOLOGIC: No rash, no itching, no lesions. ENDOCRINE: No polyuria, polydipsia, no heat or cold intolerance. No recent change in weight. HEMATOLOGICAL: No anemia or easy bruising or bleeding. NEUROLOGIC: No headache, seizures, numbness, tingling or weakness. PSYCHIATRIC: No depression, no loss of interest in normal activity or change in sleep pattern.     Exam:   BP 126/80 (BP Location: Right Arm, Patient Position: Sitting, Cuff Size: Normal)   Ht 4\' 9"  (1.448 m)   Wt 143 lb (64.9 kg)   BMI 30.94 kg/m   Body mass index is 30.94 kg/m.  General appearance : Well developed well nourished female. No acute distress HEENT: Eyes: no  retinal hemorrhage or exudates,  Neck supple, trachea midline, no carotid bruits, no thyroidmegaly Lungs: Clear to auscultation, no rhonchi or wheezes, or rib retractions  Heart: Regular rate and rhythm, no murmurs or gallops Breast:Examined in sitting and supine position were symmetrical in appearance, no palpable masses or tenderness,  no skin retraction, no nipple inversion, no nipple discharge, no skin discoloration, no axillary or supraclavicular lymphadenopathy Abdomen: no palpable masses or tenderness, no rebound or guarding Extremities: no edema or skin discoloration or tenderness  Pelvic: Vulva: Normal             Vagina: No gross lesions or discharge  Cervix: No gross lesions or discharge.  Pap reflex done.  Uterus  AV, normal size, shape and consistency, non-tender and mobile  Adnexa  Without masses or tenderness  Anus: Normal   Assessment/Plan:  70 y.o. female for annual exam   1. Encounter for routine gynecological examination with Papanicolaou smear of cervix Normal gynecologic exam in menopause.  Pap reflex done, will not need further Pap if negative and continues to be abstinent.  Breast exam normal.  Will schedule screening mammogram.  Cologuard February 2022.  Health labs with family physician.  Body mass index 30.94.  Recommend a lower calorie/carb diet.  Aerobic activities 5 times a week and light weightlifting every 2 days.  2. Postmenopause Well on no HRT.  No postmenopausal bleeding.  3. Age-related osteoporosis without current pathological fracture Osteoporosis on bone density October 2021.  Continue on Fosamax.  Vitamin D supplements, calcium intake of 1500 mg daily and regular weightbearing physical activities.  Genia Del MD, 2:32 PM 09/06/2020

## 2020-09-07 ENCOUNTER — Other Ambulatory Visit: Payer: Self-pay

## 2020-09-07 LAB — PAP IG W/ RFLX HPV ASCU

## 2020-09-10 ENCOUNTER — Telehealth: Payer: Self-pay | Admitting: Gastroenterology

## 2020-09-10 ENCOUNTER — Other Ambulatory Visit: Payer: Self-pay

## 2020-09-10 ENCOUNTER — Encounter: Payer: Self-pay | Admitting: Gastroenterology

## 2020-09-10 DIAGNOSIS — R195 Other fecal abnormalities: Secondary | ICD-10-CM

## 2020-09-10 MED ORDER — PEG 3350-KCL-NA BICARB-NACL 420 G PO SOLR: 4000.0000 mL | Freq: Once | ORAL | 0 refills | Status: DC

## 2020-09-10 NOTE — Telephone Encounter (Signed)
cologuard result received on 2/11. See results note

## 2020-09-10 NOTE — Telephone Encounter (Signed)
[facility] called to confirm the receipt of a positive cologuard test that was faxed on 09/06/20.  Tel: [phone]-Provider support Case #[id number]

## 2020-09-17 ENCOUNTER — Encounter: Payer: Medicare PPO | Attending: Psychology | Admitting: Psychology

## 2020-09-17 ENCOUNTER — Encounter: Payer: Self-pay | Admitting: Psychology

## 2020-09-17 ENCOUNTER — Other Ambulatory Visit: Payer: Self-pay

## 2020-09-17 ENCOUNTER — Telehealth: Payer: Self-pay | Admitting: Gastroenterology

## 2020-09-17 DIAGNOSIS — F0390 Unspecified dementia without behavioral disturbance: Secondary | ICD-10-CM | POA: Insufficient documentation

## 2020-09-17 NOTE — Telephone Encounter (Signed)
The pt's caregiver has been advised that she will not need any prep prior to the colonoscopy. She will be pre admitted for prepping.  She has been advised that the pt will be getting a call on the morning prior to the procedure from [facility] bed placement for a time to arrive.  The pt has been advised of the information and verbalized understanding.

## 2020-09-17 NOTE — Progress Notes (Signed)
   Neuropsychology Note  Madison Glover completed another 120 minutes of neuropsychological testing with this provider Medical laboratory scientific officer). The patient did not appear overtly distressed by the testing session, per behavioral observation or via self-report. Rest breaks were offered.   Tests Administered   Animal Naming   DIRECTV Verbal Learning Test (CVLT-III Short Form)   [place] Functional Living Scale (TFLS)   Trail Making Test (TMT; Part A & B)   Wechsler Adult Intelligence Scale, 4th Edition (WAIS-IV)  Wechsler Intelligence Scale, 4th Edition (WMS-IV); Older Adult Battery  Results:  To be included in final report.   Feedback with Patient:  Madison Glover will return within 2 weeks for an interactive feedback session with this provider at which time her test performances, clinical impressions and treatment recommendations will be reviewed in detail. The patient understands she can contact our office should she require our assistance before this time.  Full report to follow.

## 2020-09-17 NOTE — Telephone Encounter (Signed)
Left message on machine to call back  

## 2020-09-17 NOTE — Telephone Encounter (Signed)
Patient POA called regarding prep instructions and pre medication info.

## 2020-10-02 ENCOUNTER — Encounter: Payer: Medicare PPO | Attending: Psychology | Admitting: Psychology

## 2020-10-02 ENCOUNTER — Other Ambulatory Visit: Payer: Self-pay | Admitting: Neurology

## 2020-10-02 ENCOUNTER — Other Ambulatory Visit: Payer: Self-pay

## 2020-10-02 DIAGNOSIS — F0280 Dementia in other diseases classified elsewhere without behavioral disturbance: Secondary | ICD-10-CM | POA: Insufficient documentation

## 2020-10-02 DIAGNOSIS — F339 Major depressive disorder, recurrent, unspecified: Secondary | ICD-10-CM | POA: Insufficient documentation

## 2020-10-02 DIAGNOSIS — F330 Major depressive disorder, recurrent, mild: Secondary | ICD-10-CM | POA: Diagnosis not present

## 2020-10-02 DIAGNOSIS — F03B0 Unspecified dementia, moderate, without behavioral disturbance, psychotic disturbance, mood disturbance, and anxiety: Secondary | ICD-10-CM

## 2020-10-02 DIAGNOSIS — F0390 Unspecified dementia without behavioral disturbance: Secondary | ICD-10-CM | POA: Diagnosis not present

## 2020-10-08 ENCOUNTER — Encounter: Payer: Self-pay | Admitting: Psychology

## 2020-10-08 NOTE — Progress Notes (Incomplete)
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 bicycling, etc.) has been demonstrated to be a very effective treatment for depression and stress, with similar efficacy rates to both antidepressant medication and psychotherapy. And for those with orthopedic issues, water aerobics may be particularly beneficial.  14. Nutritional factors can have a  significant effect on psychological and emotional status, as well as overall brain functioning.  The following general recommendations have been associated with improvements in depression and other psychological symptoms, as well as lower risk for dementia and other forms of cognitive impairment.  Please discuss these recommendations with your physician and/or dietitian before initiating:  . Consume a wide variety of fresh fruits and vegetables, particularly including brightly colored items such as berries, oranges, tomatoes, peppers, carrots, broccoli, spinach, dark  green lettuces, sweet potatoes, etc., all of which are high in vitamins and antioxidants.  . Consume foods that are high in fiber, such as legumes (e.g., beans, peas, lentils) and foods made from whole grains (e.g., whole wheat bread and pasta)  . Consume a significant amount of omega-3 essential fats and oils.  These can be found in natural food sources such as salmon and other fatty fish, and also products made from flax seed and flax seed oil.  Alternately, dietary supplementation with fish oil capsules and flax seed oil capsules is a good way to boost one's level of omega-3 consumption.  It is important to check with your doctor before taking these supplements, especially if you take blood thinning medication.  . If you do not already do so, consider taking a quality multivitamin supplement under the guidance of your primary care physician.   . Consider keeping consumption of the following foods to a minimum: 1) foods made from white flour and white sugar; 2) artificial sweeteners; 3) deep-fried foods; 4) animal fat other than fish; 5) any foods containing "hydrogenated" or "trans" fats; 6) most other types of highly processed packaged/prepared foods.   15. Neuropsychological re-evaluation is recommended in approximately one year (or sooner) to monitor treatment efficacy, to assist with the management of the patient (start or continue rehab or pharmacological therapy), to determine any clinical and functional significance of brain abnormality over time, as well as to document any potential improvement or decline in cognitive functioning. Lastly, any follow-up testing will help delineate the specific cognitive basis of any new functional complaints. If you wish to make this follow-up appointment, please contact our office at [phone].  16. Neuropsychological re-evaluation is recommended as needed to monitor treatment efficacy, to assist with the management of the patient (start or continue rehab  or pharmacological therapy), to determine any clinical and functional significance of brain abnormality over time, as well as to document any potential improvement or decline in cognitive functioning. Lastly, any follow-up testing will help delineate the specific cognitive basis of any new functional complaints. If you wish to make a follow-up appointment, please contact our office at [phone].  17. Try to keep in mind that common word finding errors are not necessarily the start of a dreadful decline. Over-focusing on these errors can contribute to further distraction and emotions that can detract from effective retrieval of words; this in turn, can lead to greater distress and more difficulties with recalling the specific word you were looking for to begin with.   The following are several strategies that may help:  . Performance will generally be best in a structured, routine, and familiar environment, as opposed to situations involving complex problems.  Charlene Brooke. Designate a place to keep your keys, wallet, cell phone, and other personal belongings. . Take time to register and process information to be remembered. Deeper encoding of information can be gained by forming a mental picture, making meaningful associations, connecting new information to previously learned and related information, paraphrasing and repetition.  .Marland Kitchen  To the extent possible, multitasking should be avoided; break down tasks into smaller steps to help get started and to keep from feeling overwhelmed. And if there are difficulties in organization and planning, maintaining a daily organizer to help keep track of important appointments and information may be beneficial.   . Memory problems may at least be minimally addressed using compensatory strategies such as the use of a daily schedule to follow, memos, portable recorder, a centrally located bulletin board, or memory notebook. A large calendar, placed in a highly visible location would  be valuable to keep track of dates and appointments.  In addition, it would be helpful to keep a log of all of medical appointments with the name of the doctor, date of visit, diagnoses, and treatments.  . Use of a medication box is recommended to ensure compliance and decrease confusion regarding medication dosages, times, and dates. . To aid in managing problems with attention, the patient may consider using some of the following strategies: o The patient should simplify tasks.  There may be a need to break overly complex activities into simple step-by-step tasks, keep these steps written down in a note book and then check them off as they are completed which will help to stay on task and make sure the whole task is finished.  o The patient should set deadlines for everything, even for seemingly small tasks, prioritize time-sensitive tasks and write down every assignment, message, or important thought. o The patient is encouraged to use timers and alarms to stay on track and take breaks at regular intervals. Avoid piles of paperwork or procrastination by dealing with each item as it comes in.  Community resources are available regarding the care of adults with dementia. The [employer] has a website that offers tips and recommendations to aide families in caring for family members with dementia (even for those without Alzheimer's disease). MidwifeLocator.com.ee.  In addition, a document that contains valuable resources for dementia care is provided.   Techniques for making living spaces safer:  . Make the stovetop and oven inaccessible by removing or covering the knobs . Secure all medications in a safe place . Unplug appliances when not in use . Reduce tripping hazards such as moving any cords out of the way . Could consider marking the edges of steps with colored tape . Install night lights throughout the house  Preventing fraudulent activity: individuals with Alzheimer's disease and  other dementias may be at increased risk of being taken advantage of. Consider the following:  . Limit access to credit cards or cash . Could consider placing a sign on the front door such as "No Solicitations" . Could contact the Federal Trade Commission's "Do Not Call" list and add the person's number. They can be reached at [phone]  Techniques of communicating with a person who has dementia/memory impairment:  . Maintain a regular schedule for as many activities as possible. . Practice reality orientation. . Repeat information quietly and firmly. Marland Kitchen Keep the voice low and calm and be rational and understanding. . Talk in a warm and encouraging manner. . Talk gently and calmly; smile and be relaxed. . Use normal voice tone, do not be condescending. . Talk in a quiet place without distraction. . Show respect and acceptance. . Use one sentence for each idea and check to see if the patient understands before proceeding. . Do not interrupt, signal acceptance of message by nods, or smiles when appropriate, and repeat what the patient has said  when the message is complete. Marland Kitchen Keep stress, arguing, disagreements, and verbal tirades to a minimum, as any additional stressor on the patient will cause continued and more rapid deterioration.  In general, the best way to manage the behavioral and psychological symptoms of dementia such as agitation involves behavioral strategies such as using the three R's.  o Redirection (help distract your loved one by focusing their attention on something else, moving them to a new environment, or otherwise engaging them in something other than what is distressing to them)  o Reassurance (reassure them that you are there to take care of them and that there is nothing they need to be worried about), and  o Reconsidering (consider the situation from their perspective and try to identify if there is something about the situation or environment that may be triggering  their reaction).  Hallucinations are false perceptions of objects or events involving the senses. These false perceptions can be caused by changes within the brain that result from Alzheimer's, usually in the later stages of the disease, although they can also occur as a result of vision loss. The following strategies may be helpful in responding to the patient's hallucinations: o Offer reassurance - Respond in a calm, supportive manner. You may want to respond with, "Don't worry. I'm here. I'll protect you. I'll take care of you." - Gentle patting may turn the person's attention toward you and reduce the hallucination. - Acknowledge the feelings behind the hallucination and try to find out what the hallucination means to the individual. You might want to say, "It sounds as if you're worried" or "I know this is frightening for you." o Use distractions - Suggest a walk or move to another room. Frightening hallucinations often subside in well-lit areas where other people are present. - Try to turn the person's attention to music, conversation or activities you enjoy together. o Respond honestly - If the person asks you about a hallucination or delusion, be honest. For example, if he or she asks, "Do you see him?" you may want to answer with, "I know you see something, but I don't see it." This way, you're not denying what the person sees or hears, but you avoid an argument. o Modify the environment - Check for sounds that might be misinterpreted, such as noise from a television or an air conditioner. - Look for lighting that casts shadows, reflections or distortions on the surfaces of floors, walls and furniture. Turn on lights to reduce shadows. - Cover mirrors with a cloth or remove them if the person thinks that he or she is looking at a stranger.  Delusions (firmly held beliefs in things that are not real) may occur in middle-to-late-stage dementia. Confusion and memory loss - such as the  inability to remember certain people or objects - can contribute to these untrue beliefs. A person with dementia may believe a family member is stealing his or her possessions or that he or she is being followed by the police. This kind of suspicious delusion is sometimes referred to as paranoia. Although not grounded in reality, the situation is very real to the person with dementia. Keep in mind that a person with dementia is trying to make sense of his or her world with declining cognitive function. A delusion is not the same thing as a hallucination. While delusions involve false beliefs, hallucinations are false perceptions of objects or events that are sensory in nature. The following strategies may be helpful in responding to  delusions: o Don't take offense. Listen to what is troubling the person, and try to understand that reality. Then be reassuring, and let the person know you care. o Don't argue or try to convince. Allow the individual to express ideas. Acknowledge his or her opinions. o Offer a simple answer. Share your thoughts with the individual, but keep it simple. Don't overwhelm the person with lengthy explanations or reasons. o Switch the focus to another activity. Engage the individual in an activity, or ask for help with a chore. o Duplicate any lost items. If the person is often searching for a specific item, have several available (if possible). For example, if the individual is always looking for his or her wallet, purchase two of the same kind.   Feedback to Patient: Yarrow Linhart Digiacomo returned for a feedback appointment on 10/08/2020 to review the results of her neuropsychological evaluation with this provider. *** minutes face-to-face time was spent reviewing her test results, my impressions and my recommendations as detailed above.    Thank you for your referral of Laelynn Blizzard Gosch. Please feel free to contact me if you have any questions or concerns regarding this  report.   If you have any questions, please contact us at [phone].   This report is intended solely for the confidential review and use by the referring professional to assist in diagnostic and medical decision making needs.  This report should not be released to a third party without proper consent. [NOTE: data can be made available to qualified professionals with permission from the patient or legal representative/caregiver]    ____________________________________ Horton Finer, PsyD,  Licensed Psychologist (Provisional) Clinical Neuropsychologist     Billing/Service Summary   Neurobehavioral Status Exam:  Base: [phone] Add-on: [phone]  Direct clinical assessment (interview) of the patient and collateral interviews (as appropriate) by the licensed psychologist                                                                                      Total time: 120 minutes       Total units:  1 1  Neuropsychological Testing Evaluation Services:   Base: [id number] Add-on: [id number]  Records review & clarify referral question; Patient symptom management; clinical decision making/battery modification; Integration/report generation; and, post-service work   Total time:  180 minutes  Total units:  2 1  IT consultant by Psychologist:  Base: [id number] Add-on: [phone]  Test Administration (face-to-face) Scoring (Non-face-to-face)                                                                                       Total time: 240  minutes      Total units:  1 6

## 2020-10-08 NOTE — Progress Notes (Addendum)
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 domains are generally atypical of impairment solely secondary to depression or other psychological condition. Thus, her neuropsychological profile is concerning for an organic process.    Given memory deficits, direct monitoring and/or supervision of self-administration of medications is warranted as patient does not regularly comply. Daily reminders from friends is not suffice as she hides them instead. Additionally, patient showed impaired performance on task assessing the ability to use various communication tools and carry out daily functions such as writing a check to pay a utility bill, addressing an envelope to mail a check, looking up numbers in the phone directory and dialing the numbers. As such, performing these activities in real life situations may also be compromised. Compensatory procedures may help minimize the impact of functional difficulties (e.g., programming phone to dial certain individuals or services automatically, putting up phone numbers in large print by the phone, etc.).  Family members, friends,  and other helpers  may be needed in order to take over some of these tasks for the individual. She performed well using a calendar and telling time, which suggests she may still have the ability to carry out time-dependent activities (e.g., bible study). Madison Glover enjoys attending church and related activities and is highly encouraged to continue participating with friends and/or family that understand her current challenges. She should continue to stay active socially, physically, and mentally active. Continuous activity and responsibility can provide a sense of purpose in life, facilitate maintenance of independence, and improve overall quality of life. It is important that such tasks take into consideration both strengths and weaknesses.   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 Follow-up with Margie Ege, NP and Limmie Patricia. Philip Aspen, MD:  . Continue treatment with cholinesterase inhibitor and NMDA antagonist . If agitation worsens, or begins to significantly affect daily life, then consider Nuedexta.   . Consider nuclear neuroimaging to further aid with differential diagnosis (i.e., SPECT/PET). . Could consider spinal tap to assess for amyloid beta and tau proteins in cerebrospinal fluid, though this procedure is invasive and adds marginal additive value over clinical diagnosis.  . Considering the noted medical history, the patient is at increased risk for progression of cognitive impairment. Therefore, it is recommended that the patient aggressively  manage any modifiable risk factors for further cognitive decline such as strict compliance with prescribed medical treatments for any cerebrovascular risk factors (e.g., hypertension, high cholesterol, etc.).  2. The [facility] at the [facility] in [place] provides counseling services for individuals diagnosed with mild cognitive impairment, Alzheimer's disease, or another form of dementia, as well as to their family members. Services include individual, couple, and family counseling, as well as support groups, all of which provide a safe environment to talk about the journey with mild cognitive impairment or dementia, learn as much as possible about the disease, problem solve some of the common challenges encountered with memory and cognitive loss, and strengthen relationships. For more information, they can be contacted at [phone].   3. Due to the nature and severity of the symptoms noted during this evaluation, it is recommended that the patient remain under at least partial care and supervision, as the cognitive deficits noted represent a safety risk if left alone for extended periods of time.  Given the family reports of episodes of poor judgment resulting in possible safety issues, a home safety evaluation with someone such as an occupational therapist is warranted.   4. Continued assistance with certain activities of daily living (e.g., medication and financial management, transportation, grocery shopping) is recommended.    5. It is recommended that the severity of the patient's impairments is considered when assessing the level of asset management required. For example, given impairment higher-level reasoning and problem-solving skills, it is recommended that the patient consult with her POAs for financial management and medical decision-making before making any important medical, legal, or financial decisions.    6. The patient should refrain from driving, as deficits  noted on testing could affect one's ability to safely operate a motor vehicle.    7. It may be beneficial to  contact the [employer] [facility] in [place] to find alternative methods of transportation and identify other services that may be beneficial for the patient now or in the future. They can be reached at [phone].  8. Regular medical care is important for an individual with dementia. Therefore, make sure to maintain regular appointments with all medical providers. In addition, schedule these appointments during the patient's best time of day.  9. Reading the 36-Hour Day by Freddi Che and Rabins may be helpful in providing educational support for family members.  In addition, attending a dementia support group may help everyone share some of their feelings with others who are encountering similar problems.  10. SalonLookup.es is a link is to The Caregiver's Handbook: A comprehensive guide with tools, resources, and in-depth solutions to some of caregiving's toughest challenges.   11. The patient should wear identification at all times, in case the individual becomes lost and disoriented.   12. The patient is encouraged to attend to lifestyle factors for brain health (e.g., regular physical exercise, good nutrition habits, regular participation in cognitively-stimulating activities, and general stress management techniques), which are likely to have benefits for both emotional adjustment and cognition.  In fact, in addition to promoting general good health, regular exercise incorporating aerobic activities (e.g., brisk walking, jogging, bicycling, etc.) has been demonstrated to be a very effective treatment for depression and stress, with similar efficacy rates to both antidepressant medication and psychotherapy. And for those with orthopedic issues, water aerobics may be particularly beneficial.  13. Nutritional factors can have a  significant effect on psychological and emotional status, as well as overall brain functioning.  The following general recommendations have been associated with improvements in depression and other psychological symptoms, as well as lower risk for dementia and other forms of cognitive impairment.  Please discuss these recommendations with your physician and/or dietitian before initiating:  . Consume a wide variety of fresh fruits and vegetables, particularly including brightly colored items such as berries, oranges, tomatoes, peppers, carrots, broccoli, spinach, dark green lettuces, sweet potatoes, etc., all of which are high in vitamins and antioxidants.  . Consume foods that are high in fiber, such as legumes (e.g., beans, peas, lentils) and foods made from whole grains (e.g., whole wheat bread and pasta)  . Consume a significant amount of omega-3 essential fats and oils.  These can be found in natural food sources such as salmon and other fatty fish, and also products made from flax seed and flax seed oil.  Alternately, dietary supplementation with fish oil capsules and flax seed oil capsules is a good way to boost one's level of omega-3 consumption.  It is important to check with your doctor before taking these supplements, especially if you take blood thinning medication.  . If you do not already do so, consider taking a quality multivitamin supplement under the guidance of your primary care physician.   . Consider keeping consumption of the following foods to a minimum: 1) foods made from white flour and white sugar; 2) artificial sweeteners; 3) deep-fried foods; 4) animal fat other than fish; 5) any foods containing "hydrogenated" or "trans" fats; 6) most other types of highly processed packaged/prepared foods.   14. Neuropsychological re-evaluation is recommended in approximately one year (or sooner) to monitor treatment efficacy, to assist with the management of the patient (start or continue  rehab or pharmacological therapy), to determine any clinical and functional significance of brain abnormality over time, as well as to document any potential  improvement or decline in cognitive functioning. Lastly, any follow-up testing will help delineate the specific cognitive basis of any new functional complaints. If you wish to make this follow-up appointment, please contact our office at [phone].  The following are several strategies that may help:  . Performance will generally be best in a structured, routine, and familiar environment, as opposed to situations involving complex problems.  Marlene Lard a place to keep your keys, wallet, cell phone, and other personal belongings. . Take time to register and process information to be remembered. Deeper encoding of information can be gained by forming a mental picture, making meaningful associations, connecting new information to previously learned and related information, paraphrasing and repetition.  . To the extent possible, multitasking should be avoided; break down tasks into smaller steps to help get started and to keep from feeling overwhelmed. And if there are difficulties in organization and planning, maintaining a daily organizer to help keep track of important appointments and information may be beneficial.   . Memory problems may at least be minimally addressed using compensatory strategies such as the use of a daily schedule to follow, memos, portable recorder, a centrally located bulletin board, or memory notebook. A large calendar, placed in a highly visible location would be valuable to keep track of dates and appointments.  In addition, it would be helpful to keep a log of all of medical appointments with the name of the doctor, date of visit, diagnoses, and treatments.  . Use of a medication box is recommended to ensure compliance and decrease confusion regarding medication dosages, times, and dates. . To aid in managing problems  with attention, the patient may consider using some of the following strategies: o The patient should simplify tasks.  There may be a need to break overly complex activities into simple step-by-step tasks, keep these steps written down in a note book and then check them off as they are completed which will help to stay on task and make sure the whole task is finished.  o The patient should set deadlines for everything, even for seemingly small tasks, prioritize time-sensitive tasks and write down every assignment, message, or important thought. o The patient is encouraged to use timers and alarms to stay on track and take breaks at regular intervals. Avoid piles of paperwork or procrastination by dealing with each item as it comes in.  Community resources are available regarding the care of adults with dementia. The Alzheimer's Association has a website that offers tips and recommendations to aide families in caring for family members with dementia (even for those without Alzheimer's disease). VerifiedMovies.de.  In addition, a document that contains valuable resources for dementia care is provided.   Techniques for making living spaces safer:  . Make the stovetop and oven inaccessible by removing or covering the knobs . Secure all medications in a safe place . Unplug appliances when not in use . Reduce tripping hazards such as moving any cords out of the way . Could consider marking the edges of steps with colored tape . Install night lights throughout the house  Preventing fraudulent activity: individuals with Alzheimer's disease and other dementias may be at increased risk of being taken advantage of. Consider the following:  . Limit access to credit cards or cash . Could consider placing a sign on the front door such as "No Solicitations" . Could contact the [place] "Do Not Call" list and add the person's number. They can be reached at [phone]  Techniques of  communicating with a person who has dementia/memory impairment:  . Maintain a regular schedule for as many activities as possible. . Practice reality orientation. . Repeat information quietly and firmly. Marland Kitchen Keep the voice low and calm and be rational and understanding. . Talk in a warm and encouraging manner. . Talk gently and calmly; smile and be relaxed. . Use normal voice tone, do not be condescending. . Talk in a quiet place without distraction. . Show respect and acceptance. . Use one sentence for each idea and check to see if the patient understands before proceeding. . Do not interrupt, signal acceptance of message by nods, or smiles when appropriate, and repeat what the patient has said when the message is complete. Marland Kitchen Keep stress, arguing, disagreements, and verbal tirades to a minimum, as any additional stressor on the patient will cause continued and more rapid deterioration.  In general, the best way to manage the behavioral and psychological symptoms of dementia such as agitation involves behavioral strategies such as using the three R's.  o Redirection (help distract your loved one by focusing their attention on something else, moving them to a new environment, or otherwise engaging them in something other than what is distressing to them)  o Reassurance (reassure them that you are there to take care of them and that there is nothing they need to be worried about), and  o Reconsidering (consider the situation from their perspective and try to identify if there is something about the situation or environment that may be triggering their reaction).  Hallucinations are false perceptions of objects or events involving the senses. These false perceptions can be caused by changes within the brain that result from Alzheimer's, usually in the later stages of the disease, although they can also occur as a result of vision loss. The following strategies may be helpful in responding to the  patient's hallucinations: o Offer reassurance - Respond in a calm, supportive manner. You may want to respond with, "Don't worry. I'm here. I'll protect you. I'll take care of you." - Gentle patting may turn the person's attention toward you and reduce the hallucination. - Acknowledge the feelings behind the hallucination and try to find out what the hallucination means to the individual. You might want to say, "It sounds as if you're worried" or "I know this is frightening for you." o Use distractions - Suggest a walk or move to another room. Frightening hallucinations often subside in well-lit areas where other people are present. - Try to turn the person's attention to music, conversation or activities you enjoy together. o Respond honestly - If the person asks you about a hallucination or delusion, be honest. For example, if he or she asks, "Do you see him?" you may want to answer with, "I know you see something, but I don't see it." This way, you're not denying what the person sees or hears, but you avoid an argument. o Modify the environment - Check for sounds that might be misinterpreted, such as noise from a television or an air conditioner. - Look for lighting that casts shadows, reflections or distortions on the surfaces of floors, walls and furniture. Turn on lights to reduce shadows. - Cover mirrors with a cloth or remove them if the person thinks that he or she is looking at a stranger.  Delusions (firmly held beliefs in things that are not real) may occur in middle-to-late-stage dementia. Confusion and memory loss -- such as the inability to remember certain  people or objects -- can contribute to these untrue beliefs. A person with dementia may believe a family member is stealing his or her possessions or that he or she is being followed by the police. This kind of suspicious delusion is sometimes referred to as paranoia. Although not grounded in reality, the situation is very real to  the person with dementia. Keep in mind that a person with dementia is trying to make sense of his or her world with declining cognitive function. A delusion is not the same thing as a hallucination. While delusions involve false beliefs, hallucinations are false perceptions of objects or events that are sensory in nature. The following strategies may be helpful in responding to delusions: o Don't take offense. Listen to what is troubling the person, and try to understand that reality. Then be reassuring, and let the person know you care. o Don't argue or try to convince. Allow the individual to express ideas. Acknowledge his or her opinions. o Offer a simple answer. Share your thoughts with the individual, but keep it simple. Don't overwhelm the person with lengthy explanations or reasons. o Switch the focus to another activity. Engage the individual in an activity, or ask for help with a chore. o Duplicate any lost items. If the person is often searching for a specific item, have several available (if possible). For example, if the individual is always looking for his or her wallet, purchase two of the same kind.   Feedback to Patient: Madison Gutter Minorwill  return for a feedback appointment on 10/09/2020 to review the results of her neuropsychological evaluation with this provider.   Thank you for your referral of Madison Glover. Please feel free to contact me if you have any questions or concerns regarding this report.   If you have any questions, please contact us at [phone].   This report is intended solely for the confidential review and use by the referring professional to assist in diagnostic and medical decision making needs.  This report should not be released to a third party without proper consent. [NOTE: data can be made available to qualified professionals with permission from the patient or legal  representative/caregiver]    ____________________________________ Alfonso Ellis, PsyD,  Licensed Psychologist (Provisional) Clinical Neuropsychologist     Billing/Service Summary   Neurobehavioral Status Exam:  Base: [phone] Add-on: [phone]  Direct clinical assessment (interview) of the patient and collateral interviews (as appropriate) by the licensed psychologist                                                                                      Total time: 120 minutes       Total units:  1 1  Neuropsychological Testing Evaluation Services:   Base: [id number] Add-on: [phone]  Records review & clarify referral question; Patient symptom management; clinical decision making/battery modification; Integration/report generation; and, post-service work   Total time:  180 minutes  Total units:  2 1  Designer, fashion/clothing by Psychologist:  Base: [id number] Add-on: [id number]  Test Administration (face-to-face) Scoring (Non-face-to-face)  Total time: 240  minutes      Total units:  1 6

## 2020-10-09 ENCOUNTER — Encounter (HOSPITAL_BASED_OUTPATIENT_CLINIC_OR_DEPARTMENT_OTHER): Payer: Medicare PPO | Admitting: Psychology

## 2020-10-09 ENCOUNTER — Other Ambulatory Visit: Payer: Self-pay

## 2020-10-09 DIAGNOSIS — F0280 Dementia in other diseases classified elsewhere without behavioral disturbance: Secondary | ICD-10-CM

## 2020-10-09 DIAGNOSIS — F339 Major depressive disorder, recurrent, unspecified: Secondary | ICD-10-CM | POA: Diagnosis not present

## 2020-10-09 DIAGNOSIS — F0390 Unspecified dementia without behavioral disturbance: Secondary | ICD-10-CM | POA: Diagnosis not present

## 2020-10-09 DIAGNOSIS — F03B0 Unspecified dementia, moderate, without behavioral disturbance, psychotic disturbance, mood disturbance, and anxiety: Secondary | ICD-10-CM

## 2020-10-09 NOTE — Progress Notes (Signed)
   Neuropsychology Feedback Appointment  Violet Baldy Christmas and her two close friends (Fay-MPOA and [name]-FPOA) returned for a feedback appointment today to review the results of her recent neuropsychological evaluation with this provider. 60 minutes face-to-face time was spent reviewing her test results, my impressions and my recommendations as detailed in her report. Education was provided about her diagnosis. The patient and her two friends with medical and financial POA were given the opportunity to ask questions, and I did my best to answer these to their satisfaction. Expressed concerns regarding patients ability to independently manage medication. Patient's friends agreed with this assessment and described several recent incidents of finding patient's medication stashed away in a hidden place (e.g., picture frame, desk drawer) around the house. Patient became highly defensive at first but then admitted to not taking medications. She was not able to provide clear answer as to why. I recommended that patient receive a home safety evaluation with someone such as an occupational therapist given reports of concealing medications and increasing episodes of poor judgment resulting in possible safety issues.

## 2020-10-11 NOTE — Progress Notes (Signed)
Attempted to obtain medical history via telephone, unable to reach at this time. I left a voicemail to return pre surgical testing department's phone call.  

## 2020-10-12 NOTE — Telephone Encounter (Signed)
The pt caregiver has been advised that the pt will be called the morning that she will be admitted with time of arrival.  She will be admitted for prepping.

## 2020-10-12 NOTE — Telephone Encounter (Signed)
Patient's caregiver, [name] calling back requesting a call from the nurse please to discuss and get clarification on the prep.

## 2020-10-15 ENCOUNTER — Other Ambulatory Visit (HOSPITAL_COMMUNITY): Payer: Medicare PPO

## 2020-10-17 ENCOUNTER — Encounter (HOSPITAL_COMMUNITY): Payer: Self-pay | Admitting: Gastroenterology

## 2020-10-17 ENCOUNTER — Observation Stay (HOSPITAL_COMMUNITY)
Admission: AD | Admit: 2020-10-17 | Discharge: 2020-10-19 | Disposition: A | Discharge status: Home / Self Care | Payer: Medicare PPO | Source: Ambulatory Visit | Attending: Gastroenterology | Admitting: Gastroenterology

## 2020-10-17 ENCOUNTER — Other Ambulatory Visit: Payer: Self-pay

## 2020-10-17 DIAGNOSIS — Z1211 Encounter for screening for malignant neoplasm of colon: Secondary | ICD-10-CM

## 2020-10-17 DIAGNOSIS — I10 Essential (primary) hypertension: Secondary | ICD-10-CM | POA: Insufficient documentation

## 2020-10-17 DIAGNOSIS — E559 Vitamin D deficiency, unspecified: Secondary | ICD-10-CM

## 2020-10-17 DIAGNOSIS — K5730 Diverticulosis of large intestine without perforation or abscess without bleeding: Secondary | ICD-10-CM | POA: Insufficient documentation

## 2020-10-17 DIAGNOSIS — U071 COVID-19: Secondary | ICD-10-CM | POA: Insufficient documentation

## 2020-10-17 DIAGNOSIS — M810 Age-related osteoporosis without current pathological fracture: Secondary | ICD-10-CM | POA: Diagnosis not present

## 2020-10-17 DIAGNOSIS — E785 Hyperlipidemia, unspecified: Secondary | ICD-10-CM | POA: Diagnosis not present

## 2020-10-17 DIAGNOSIS — Z79899 Other long term (current) drug therapy: Secondary | ICD-10-CM | POA: Insufficient documentation

## 2020-10-17 DIAGNOSIS — K635 Polyp of colon: Secondary | ICD-10-CM | POA: Diagnosis not present

## 2020-10-17 DIAGNOSIS — F0390 Unspecified dementia without behavioral disturbance: Secondary | ICD-10-CM | POA: Insufficient documentation

## 2020-10-17 DIAGNOSIS — R195 Other fecal abnormalities: Secondary | ICD-10-CM | POA: Diagnosis not present

## 2020-10-17 LAB — BASIC METABOLIC PANEL
Anion gap: 12 (ref 5–15)
BUN: 29 mg/dL — ABNORMAL HIGH (ref 8–23)
CO2: 23 mmol/L (ref 22–32)
Calcium: 9.9 mg/dL (ref 8.9–10.3)
Chloride: 102 mmol/L (ref 98–111)
Creatinine, Ser: 1.15 mg/dL — ABNORMAL HIGH (ref 0.44–1.00)
GFR, Estimated: 51 mL/min — ABNORMAL LOW (ref >60)
Glucose, Bld: 104 mg/dL — ABNORMAL HIGH (ref 70–99)
Potassium: 3.8 mmol/L (ref 3.5–5.1)
Sodium: 137 mmol/L (ref 135–145)

## 2020-10-17 LAB — CBC
HCT: 45.6 % (ref 36.0–46.0)
Hemoglobin: 14.4 g/dL (ref 12.0–15.0)
MCH: 29.2 pg (ref 26.0–34.0)
MCHC: 31.6 g/dL (ref 30.0–36.0)
MCV: 92.5 fL (ref 80.0–100.0)
Platelets: 389 10*3/uL (ref 150–400)
RBC: 4.93 MIL/uL (ref 3.87–5.11)
RDW: 12.9 % (ref 11.5–15.5)
WBC: 10.1 10*3/uL (ref 4.0–10.5)
nRBC: 0 % (ref 0.0–0.2)

## 2020-10-17 MED ORDER — ACETAMINOPHEN 650 MG RE SUPP: 650.0000 mg | Freq: Four times a day (QID) | RECTAL | Status: DC | PRN

## 2020-10-17 MED ORDER — MEMANTINE HCL 10 MG PO TABS
10.0000 mg | ORAL_TABLET | Freq: Two times a day (BID) | ORAL | Status: DC
  Administered 2020-10-17 – 2020-10-19 (×4): 10 mg via ORAL
  Filled 2020-10-17 (×4): qty 1

## 2020-10-17 MED ORDER — ONDANSETRON HCL 4 MG/2ML IJ SOLN: 4.0000 mg | Freq: Four times a day (QID) | INTRAMUSCULAR | Status: DC | PRN

## 2020-10-17 MED ORDER — PEG-KCL-NACL-NASULF-NA ASC-C 100 G PO SOLR
0.5000 | Freq: Once | ORAL | Status: AC
  Administered 2020-10-17: 100 g via ORAL

## 2020-10-17 MED ORDER — ACETAMINOPHEN 325 MG PO TABS: 650.0000 mg | ORAL_TABLET | Freq: Four times a day (QID) | ORAL | Status: DC | PRN

## 2020-10-17 MED ORDER — SODIUM CHLORIDE 0.9% FLUSH: 3.0000 mL | Freq: Two times a day (BID) | INTRAVENOUS | Status: DC

## 2020-10-17 MED ORDER — DONEPEZIL HCL 10 MG PO TABS
10.0000 mg | ORAL_TABLET | Freq: Every day | ORAL | Status: DC
  Administered 2020-10-17 – 2020-10-18 (×2): 10 mg via ORAL
  Filled 2020-10-17: qty 1

## 2020-10-17 MED ORDER — ONDANSETRON HCL 4 MG PO TABS
4.0000 mg | ORAL_TABLET | Freq: Four times a day (QID) | ORAL | Status: DC | PRN
  Administered 2020-10-17: 4 mg via ORAL
  Filled 2020-10-17: qty 1

## 2020-10-17 MED ORDER — CITALOPRAM HYDROBROMIDE 20 MG PO TABS
20.0000 mg | ORAL_TABLET | Freq: Every day | ORAL | Status: DC
  Administered 2020-10-18 – 2020-10-19 (×2): 20 mg via ORAL
  Filled 2020-10-17 (×2): qty 1

## 2020-10-17 MED ORDER — PEG-KCL-NACL-NASULF-NA ASC-C 100 G PO SOLR: 1.0000 | Freq: Once | ORAL | Status: DC

## 2020-10-17 MED ORDER — PEG-KCL-NACL-NASULF-NA ASC-C 100 G PO SOLR
0.5000 | Freq: Once | ORAL | Status: AC
  Administered 2020-10-17: 100 g via ORAL
  Filled 2020-10-17: qty 1

## 2020-10-17 NOTE — H&P (Signed)
Admission Note  Primary Care Physician:  Isaac Bliss, Rayford Halsted, MD Primary Gastroenterologist: Owens Loffler MD  HPI: Madison Glover is a 70 y.o. female, who was recently evaluated by Dr. Ardis Hughs in the office in referral for colon screening.  Patient has issues with dementia/short-term memory deficit and therefore initial screening was done with Cologuard stool DNA testing which returned positive.  Patient's family felt that she would be unable to prep for colonoscopy at home and she is admitted now to observation for bowel prep and plans colonoscopy in a.m. tomorrow. Patient has had no GI symptoms, had no complaints of constipation rectal bleeding diarrhea or abdominal pain. Family history negative for colon cancer as far as they are aware. No blood thinners. She does have significant memory issues, and has a caregiver.   Past Medical History:  Diagnosis Date  . Allergy   . Anxiety   . High cholesterol   . Hypertension   . Memory loss   . Osteoporosis   . Vitamin D deficiency     Past Surgical History:  Procedure Laterality Date  . LAPAROSCOPY    . TONSILLECTOMY      Prior to Admission medications   Medication Sig Start Date End Date Taking? Authorizing Provider  alendronate (FOSAMAX) 70 MG tablet TAKE 1 TABLET BY MOUTH EVERY 7 DAYS WITH A FULL GLASS OF WATER ON AN EMPTY STOMACH. Patient taking differently: Take 70 mg by mouth once a week. 07/31/20  Yes Isaac Bliss, Rayford Halsted, MD  citalopram (CELEXA) 20 MG tablet Take 1 tablet (20 mg total) by mouth daily. 04/17/20  Yes Erline Hau, MD  donepezil (ARICEPT) 10 MG tablet Take 1 tablet (10 mg total) by mouth at bedtime. 05/08/20  Yes Suzzanne Cloud, NP  hydrochlorothiazide (HYDRODIURIL) 25 MG tablet Take 1 tablet (25 mg total) by mouth daily. 04/17/20  Yes Isaac Bliss, Rayford Halsted, MD  memantine (NAMENDA) 10 MG tablet TAKE 1 TABLET BY MOUTH TWICE A DAY Patient taking differently: Take 10 mg by  mouth 2 (two) times daily. 10/02/20  Yes Suzzanne Cloud, NP  Metoprolol Succinate 100 MG CS24 Take 100 mg by mouth daily. 04/17/20  Yes Isaac Bliss, Rayford Halsted, MD  simvastatin (ZOCOR) 20 MG tablet Take 1 tablet (20 mg total) by mouth daily at 6 PM. 04/17/20  Yes Isaac Bliss, Rayford Halsted, MD  vitamin B-12 (CYANOCOBALAMIN) 1000 MCG tablet Take 1,000 mcg by mouth daily.   Yes [provider]  Vitamin D, Ergocalciferol, (DRISDOL) 1.25 MG (50000 UNIT) CAPS capsule Take 1 capsule (50,000 Units total) by mouth every 7 (seven) days for 12 doses. 07/31/20 10/17/20 Yes Erline Hau, MD    Current Facility-Administered Medications  Medication Dose Route Frequency Provider Last Rate Last Admin  . acetaminophen (TYLENOL) tablet 650 mg  650 mg Oral Q6H PRN Zakyria Metzinger S, PA-C       Or  . acetaminophen (TYLENOL) suppository 650 mg  650 mg Rectal Q6H PRN Jermanie Minshall S, PA-C      . citalopram (CELEXA) tablet 20 mg  20 mg Oral Daily Olamide Lahaie S, PA-C      . donepezil (ARICEPT) tablet 10 mg  10 mg Oral QHS Rihanna Marseille S, PA-C      . memantine (NAMENDA) tablet 10 mg  10 mg Oral BID Tahesha Skeet S, PA-C      . ondansetron (ZOFRAN) tablet 4 mg  4 mg Oral Q6H PRN Terrion Poblano S, PA-C  Or  . ondansetron (ZOFRAN) injection 4 mg  4 mg Intravenous Q6H PRN Atticus Lemberger S, PA-C      . peg 3350 powder (MOVIPREP) kit 100 g  0.5 kit Oral Once Milus Banister, MD       And  . peg 3350 powder (MOVIPREP) kit 100 g  0.5 kit Oral Once Milus Banister, MD      . sodium chloride flush (NS) 0.9 % injection 3 mL  3 mL Intravenous Q12H Carma Dwiggins S, PA-C        Allergies as of 09/10/2020 - Review Complete 09/06/2020  Allergen Reaction Noted  . Bee venom Swelling 05/04/2020    Family History  Problem Relation Age of Onset  . Breast cancer Mother   . COPD Father   . Heart disease Father   . Hypertension Father   . Cancer Brother        type unknown  . Colon cancer Neg Hx    . Esophageal cancer Neg Hx   . Stomach cancer Neg Hx   . Rectal cancer Neg Hx     Social History   Socioeconomic History  . Marital status: Divorced    Spouse name: Not on file  . Number of children: 0  . Years of education: Not on file  . Highest education level: Not on file  Occupational History  . Occupation: retired  Tobacco Use  . Smoking status: Former Smoker    Years: 4.00    Types: Cigarettes  . Smokeless tobacco: Never Used  . Tobacco comment: on occasion at parties  Vaping Use  . Vaping Use: Never used  Substance and Sexual Activity  . Alcohol use: Yes    Comment: occasional wine  . Drug use: Never  . Sexual activity: Not Currently  Other Topics Concern  . Not on file  Social History Narrative   Lives at home, alone, retired.  Education: [employer]. 2 Adopted daughters.    Social Determinants of Health   Financial Resource Strain: Not on file  Food Insecurity: Not on file  Transportation Needs: Not on file  Physical Activity: Not on file  Stress: Not on file  Social Connections: Not on file  Intimate Partner Violence: Not on file    Review of Systems:  All systems reviewed an negative except where noted in HPI.   Physical Exam: Vital signs in last 24 hours: Temp:  [98 F (36.7 C)] 98 F (36.7 C) (03/23 1712) Pulse Rate:  [66] 66 (03/23 1712) Resp:  [16] 16 (03/23 1712) BP: (129)/(75) 129/75 (03/23 1712) SpO2:  [95 %] 95 % (03/23 1712)   Not examined this evening   General:  Pleasant, well-developed, white female in NAD Head:  Normocephalic and atraumatic. Eyes:  Sclera clear, no icterus.   Conjunctiva pink. Ears:  Normal auditory acuity. Mouth:  No deformity or lesions.  Neck:  Supple; no masses . Lungs:  Clear throughout to auscultation.   No wheezes, crackles, or rhonchi. No acute distress. Heart:  Regular rate and rhythm; no murmurs. Abdomen:  Soft, nondistended, nontender. No masses, hepatomegaly. No obvious masses.  Normal  bowel .    Rectal:not done Msk:  Symmetrical without gross deformities.. Pulses:  Normal pulses noted. Extremities:  Without edema. Neurologic:  Alert and  oriented x4;  grossly normal neurologically. Skin:  Intact without significant lesions or rashes. Cervical Nodes:  No significant cervical adenopathy. Psych:  Alert and cooperative. Normal mood and affect.  Lab Results:  Impression / Plan:   #62 71 year old female with positive Cologuard stool DNA test, scheduled for colonoscopy in a.m. tomorrow. Patient unable to prep at home and therefore admitted to observation for bowel prep  #2 dementia-on Aricept and Namenda #3 hypertension 4.  Hyperlipidemia 5.  Osteoporosis  Plan; patient admitted on observation Colonoscopy scheduled with Dr. Ardis Hughs in a.m. tomorrow Clear liquid diet this evening, n.p.o. after midnight Bowel prep with movie prep this evening We will continue Celexa, Namenda and Aricept Hold HCTZ  Plan is for discharge home tomorrow post colonoscopy      LOS: 1 day   Yuval Nolet  10/17/2020, 5:36 PM

## 2020-10-18 ENCOUNTER — Encounter: Payer: Self-pay | Admitting: Psychology

## 2020-10-18 ENCOUNTER — Encounter (HOSPITAL_COMMUNITY): Payer: Self-pay | Admitting: Certified Registered Nurse Anesthetist

## 2020-10-18 ENCOUNTER — Telehealth: Payer: Self-pay | Admitting: Gastroenterology

## 2020-10-18 DIAGNOSIS — E785 Hyperlipidemia, unspecified: Secondary | ICD-10-CM | POA: Diagnosis not present

## 2020-10-18 DIAGNOSIS — I10 Essential (primary) hypertension: Secondary | ICD-10-CM | POA: Diagnosis not present

## 2020-10-18 DIAGNOSIS — M810 Age-related osteoporosis without current pathological fracture: Secondary | ICD-10-CM | POA: Diagnosis not present

## 2020-10-18 DIAGNOSIS — Z79899 Other long term (current) drug therapy: Secondary | ICD-10-CM | POA: Diagnosis not present

## 2020-10-18 DIAGNOSIS — R195 Other fecal abnormalities: Secondary | ICD-10-CM

## 2020-10-18 DIAGNOSIS — K5730 Diverticulosis of large intestine without perforation or abscess without bleeding: Secondary | ICD-10-CM | POA: Diagnosis not present

## 2020-10-18 DIAGNOSIS — F0390 Unspecified dementia without behavioral disturbance: Secondary | ICD-10-CM | POA: Diagnosis not present

## 2020-10-18 DIAGNOSIS — K635 Polyp of colon: Secondary | ICD-10-CM | POA: Diagnosis not present

## 2020-10-18 DIAGNOSIS — U071 COVID-19: Secondary | ICD-10-CM | POA: Diagnosis not present

## 2020-10-18 LAB — RESP PANEL BY RT-PCR (FLU A&B, COVID) ARPGX2
Influenza A by PCR: NEGATIVE
Influenza B by PCR: NEGATIVE
SARS Coronavirus 2 by RT PCR: POSITIVE — AB

## 2020-10-18 MED ORDER — METOPROLOL TARTRATE 50 MG PO TABS
100.0000 mg | ORAL_TABLET | Freq: Every day | ORAL | Status: DC
  Administered 2020-10-18 – 2020-10-19 (×2): 100 mg via ORAL
  Filled 2020-10-18 (×2): qty 2

## 2020-10-18 MED ORDER — SIMVASTATIN 20 MG PO TABS
20.0000 mg | ORAL_TABLET | Freq: Every day | ORAL | Status: DC
  Administered 2020-10-18: 20 mg via ORAL
  Filled 2020-10-18: qty 1

## 2020-10-18 MED ORDER — PEG-KCL-NACL-NASULF-NA ASC-C 100 G PO SOLR
0.5000 | Freq: Once | ORAL | Status: DC
  Filled 2020-10-18 (×2): qty 1

## 2020-10-18 MED ORDER — PEG-KCL-NACL-NASULF-NA ASC-C 100 G PO SOLR: 0.5000 | Freq: Once | ORAL | Status: DC

## 2020-10-18 MED ORDER — MUPIROCIN 2 % EX OINT
1.0000 "application " | TOPICAL_OINTMENT | Freq: Two times a day (BID) | CUTANEOUS | Status: DC
  Administered 2020-10-18: 1 via NASAL
  Filled 2020-10-18: qty 22

## 2020-10-18 MED ORDER — SODIUM CHLORIDE 0.9 % IV SOLN: INTRAVENOUS | Status: DC

## 2020-10-18 MED ORDER — HYDROCHLOROTHIAZIDE 25 MG PO TABS
25.0000 mg | ORAL_TABLET | Freq: Every day | ORAL | Status: DC
  Administered 2020-10-18 – 2020-10-19 (×2): 25 mg via ORAL
  Filled 2020-10-18 (×2): qty 1

## 2020-10-18 NOTE — Telephone Encounter (Signed)
She was admitted for bowel prep last night for colonoscopy today. Pre-procedure covid test this morning was positive.  Logistically I am unable to perform the colonoscopy today with room availability now.  We rescheduled and will be able to do the colonoscoy tomorrow duing my lunch hour however she is not will to do that and instead wants to go home.  cany you contact her next week about repeat colonoscopy attempt. After talking with her and her caregivers more I think she should be able to safely prep at home next time. Her caregivers understand that will require extra attention on their part.  [facility] Thursday colonoscopy, prepping at home, my next available.  Thanks

## 2020-10-18 NOTE — Telephone Encounter (Signed)
Will call pt next week to get her rescheduled for colon.

## 2020-10-18 NOTE — Discharge Summary (Signed)
[facility] Discharge Summary  Name: Madison Glover MRN: [id number] DOB: 01/10/1951 70 y.o. PCP:  Philip Aspen, Limmie Patricia, MD  Date of Admission: 10/17/2020  4:44 PM Date of Discharge: 10/19/2020 Attending Physician: Rachael Fee, MD  Discharge Diagnosis: Active Problems:   Encounter for screening colonoscopy   Positive colorectal cancer screening using Cologuard test Positive Cologuard   Covid 19 positive - asymptomatic  Consultations:  none Procedures Performed:  No results found.  GI Procedures: Colonoscopy  History/Physical Exam:  See Admission H&P    Hospital Course by problem list: Patient was admitted on 10/17/2020 for planned bowel prep and colonoscopy on 10/18/2020 after finding of positive Cologuard stool DNA.  Unfortunately patient returned Covid positive and procedure had to be canceled for 10/18/2020. She was able to be rescheduled for today and colonoscopy was completed, patient tolerated without difficulty. She had 1 subcentimeter polyp removed and was noted to have diverticulosis.  Patient has been asymptomatic from Covid perspective.  She will be discharged home this afternoon, diet as tolerated.  She will need to follow-up with her PCP.     Discharge Vitals:  BP 139/69 (BP Location: Left Arm)   Pulse (!) 59   Temp (!) 97.5 F (36.4 C) (Oral)   Resp 20   SpO2 98%   Discharge Labs:  Results for orders placed or performed during the hospital encounter of 10/17/20 (from the past 24 hour(s))  Surgical PCR screen     Status: None   Collection Time: 10/19/20  1:30 AM   Specimen: Nasal Mucosa; Nasal Swab  Result Value Ref Range   MRSA, PCR NEGATIVE NEGATIVE   Staphylococcus aureus NEGATIVE NEGATIVE    Disposition and follow-up:   Madison Glover was discharged from [facility] in stable condition.    Follow-up Appointments: Discharge Instructions    Diet - low sodium heart healthy   Complete by: As  directed    Diet - low sodium heart healthy   Complete by: As directed    Diet general   Complete by: As directed    Increase activity slowly   Complete by: As directed    Increase activity slowly   Complete by: As directed    Increase activity slowly   Complete by: As directed       Discharge Medications: Allergies as of 10/19/2020      Reactions   Bee Venom Swelling      Medication List    TAKE these medications   alendronate 70 MG tablet Commonly known as: FOSAMAX TAKE 1 TABLET BY MOUTH EVERY 7 DAYS WITH A FULL GLASS OF WATER ON AN EMPTY STOMACH. What changed: See the new instructions.   citalopram 20 MG tablet Commonly known as: CELEXA Take 1 tablet (20 mg total) by mouth daily.   donepezil 10 MG tablet Commonly known as: ARICEPT Take 1 tablet (10 mg total) by mouth at bedtime.   hydrochlorothiazide 25 MG tablet Commonly known as: HYDRODIURIL Take 1 tablet (25 mg total) by mouth daily.   memantine 10 MG tablet Commonly known as: NAMENDA TAKE 1 TABLET BY MOUTH TWICE A DAY   Metoprolol Succinate 100 MG Cs24 Take 100 mg by mouth daily.   mupirocin ointment 2 % Commonly known as: BACTROBAN Place 1 application into the nose 2 (two) times daily.   simvastatin 20 MG tablet Commonly known as: ZOCOR Take 1 tablet (20 mg total) by mouth daily at 6 PM.   vitamin B-12 1000 MCG tablet  Commonly known as: CYANOCOBALAMIN Take 1,000 mcg by mouth daily.   Vitamin D (Ergocalciferol) 1.25 MG (50000 UNIT) Caps capsule Commonly known as: DRISDOL Take 1 capsule (50,000 Units total) by mouth every 7 (seven) days for 12 doses.       Signed: Mike Gip 10/19/2020, 12:51 PM

## 2020-10-18 NOTE — Progress Notes (Addendum)
Patient ID: Madison Glover, female   DOB: [birth year], 70 y.o.   MRN: [id number]    Progress Note   Subjective   Day # 1  CC; for Colonoscopy  COVID-19 returned positive this a.m.-patient asymptomatic, completed COVID-19 vaccination and had booster  Patient completed prep without difficulty     Objective   Vital signs in last 24 hours: Temp:  [97.6 F (36.4 C)-98 F (36.7 C)] 97.6 F (36.4 C) (03/24 0517) Pulse Rate:  [63-66] 63 (03/24 0517) Resp:  [16] 16 (03/24 0517) BP: (129-155)/(71-99) 139/71 (03/24 0517) SpO2:  [93 %-95 %] 95 % (03/24 0517) Last BM Date: 10/17/20 General:  Elderly   white female in NAD Heart:  Regular rate and rhythm; no murmurs Lungs: Respirations even and unlabored, lungs CTA bilaterally Abdomen:  Soft, nontender and nondistended. Normal bowel sounds. Extremities:  Without edema. Neurologic:  Alert and oriented,  grossly normal neurologically. Psych:  Cooperative. Normal mood and affect.  Intake/Output from previous day: No intake/output data recorded. Intake/Output this shift: No intake/output data recorded.  Lab Results: Recent Labs    10/17/20 2028  WBC 10.1  HGB 14.4  HCT 45.6  PLT 389   BMET Recent Labs    10/17/20 2028  NA 137  K 3.8  CL 102  CO2 23  GLUCOSE 104*  BUN 29*  CREATININE 1.15*  CALCIUM 9.9   LFT No results for input(s): PROT, ALBUMIN, AST, ALT, ALKPHOS, BILITOT, BILIDIR, IBILI in the last 72 hours. PT/INR No results for input(s): LABPROT, INR in the last 72 hours.  Studies/Results: No results found.     Assessment / Plan:    #28  70 year old white female with dementia, admitted to observation to undergo bowel prep as it was not felt that she could complete bowel prep at home adequately. Cologuard had been done as an outpatient which was positive, no prior colonoscopy  #2 COVID-19 positive-currently asymptomatic and vaccinated  #3 hypertension #4 hyperlipidemia  Plan; procedure canceled  today because of positive Covid test. Able to get procedure rescheduled for tomorrow with Dr. Christella Hartigan at noon. Initially patient wanted to be discharged home but is now agreeable to staying in the hospital. Clear liquid diet today, n.p.o. after midnight will give half of a movi prep early in the morning tomorrow  Plan will be to discharge home tomorrow post colonoscopy    LOS: 1 day   Amy Glover  10/18/2020, 11:42 AM  ________________________________________________________________________  [facility] MD note:  I personally examined the patient, reviewed the data and agree with the assessment and plan described above. Earlier this morning Madison Glover asked to be discharged home since her colonoscopy could not be done today because of her covid+ status, logistics with [facility] endoscopy room time/turnover.  She understood that we can do it tomorrow for her around noon.  She still asked to be discharged. An hour or so later after she spoke with friends and her POA, she decided to stay.  Now I am told that she again wants to be discharged.  I have tried to call both other friends, POA but there are no answers.  I told the nurse I am happy to let her go home , also happy to help her with colonosocopy tomorrow.  Hopefully she and her friends/ POA will make a final decision soon.   Rob Bunting, MD [facility] Pager [phone]

## 2020-10-19 ENCOUNTER — Encounter (HOSPITAL_COMMUNITY): Payer: Self-pay | Admitting: Gastroenterology

## 2020-10-19 ENCOUNTER — Encounter (HOSPITAL_COMMUNITY)
Admission: AD | Disposition: A | Discharge status: Home / Self Care | Payer: Self-pay | Source: Ambulatory Visit | Attending: Gastroenterology

## 2020-10-19 DIAGNOSIS — K635 Polyp of colon: Secondary | ICD-10-CM

## 2020-10-19 DIAGNOSIS — U071 COVID-19: Secondary | ICD-10-CM | POA: Diagnosis not present

## 2020-10-19 DIAGNOSIS — E785 Hyperlipidemia, unspecified: Secondary | ICD-10-CM | POA: Diagnosis not present

## 2020-10-19 DIAGNOSIS — F0390 Unspecified dementia without behavioral disturbance: Secondary | ICD-10-CM | POA: Diagnosis not present

## 2020-10-19 DIAGNOSIS — D123 Benign neoplasm of transverse colon: Secondary | ICD-10-CM | POA: Diagnosis not present

## 2020-10-19 DIAGNOSIS — I10 Essential (primary) hypertension: Secondary | ICD-10-CM | POA: Diagnosis not present

## 2020-10-19 DIAGNOSIS — M810 Age-related osteoporosis without current pathological fracture: Secondary | ICD-10-CM | POA: Diagnosis not present

## 2020-10-19 DIAGNOSIS — K5730 Diverticulosis of large intestine without perforation or abscess without bleeding: Secondary | ICD-10-CM | POA: Diagnosis not present

## 2020-10-19 DIAGNOSIS — R195 Other fecal abnormalities: Secondary | ICD-10-CM

## 2020-10-19 DIAGNOSIS — Z79899 Other long term (current) drug therapy: Secondary | ICD-10-CM | POA: Diagnosis not present

## 2020-10-19 HISTORY — PX: POLYPECTOMY: SHX5525

## 2020-10-19 HISTORY — PX: COLONOSCOPY WITH PROPOFOL: SHX5780

## 2020-10-19 LAB — SURGICAL PCR SCREEN
MRSA, PCR: NEGATIVE
Staphylococcus aureus: NEGATIVE

## 2020-10-19 SURGERY — COLONOSCOPY WITH PROPOFOL
Anesthesia: Moderate Sedation

## 2020-10-19 SURGERY — CANCELLED PROCEDURE

## 2020-10-19 MED ORDER — MUPIROCIN 2 % EX OINT: 1.0000 "application " | TOPICAL_OINTMENT | Freq: Two times a day (BID) | CUTANEOUS | 0 refills | Status: DC

## 2020-10-19 MED ORDER — MIDAZOLAM HCL (PF) 5 MG/ML IJ SOLN
INTRAMUSCULAR | Status: AC
  Filled 2020-10-19: qty 2

## 2020-10-19 MED ORDER — FENTANYL CITRATE (PF) 100 MCG/2ML IJ SOLN
INTRAMUSCULAR | Status: AC
  Filled 2020-10-19: qty 2

## 2020-10-19 MED ORDER — VITAMIN D (ERGOCALCIFEROL) 1.25 MG (50000 UNIT) PO CAPS: 50000.0000 [IU] | ORAL_CAPSULE | ORAL | 0 refills | Status: AC

## 2020-10-19 MED ORDER — MIDAZOLAM HCL (PF) 10 MG/2ML IJ SOLN
INTRAMUSCULAR | Status: DC | PRN
  Administered 2020-10-19: 2 mg via INTRAVENOUS
  Administered 2020-10-19: 1 mg via INTRAVENOUS

## 2020-10-19 MED ORDER — FENTANYL CITRATE (PF) 100 MCG/2ML IJ SOLN
INTRAMUSCULAR | Status: DC | PRN
  Administered 2020-10-19: 12.5 ug via INTRAVENOUS
  Administered 2020-10-19: 25 ug via INTRAVENOUS

## 2020-10-19 SURGICAL SUPPLY — 22 items

## 2020-10-19 SURGICAL SUPPLY — 21 items

## 2020-10-19 NOTE — Interval H&P Note (Signed)
History and Physical Interval Note:  10/19/2020 11:28 AM  Madison Glover  has presented today for surgery, with the diagnosis of positive Cologuard.  The various methods of treatment have been discussed with the patient and family. After consideration of risks, benefits and other options for treatment, the patient has consented to  Procedure(s): COLONOSCOPY WITH PROPOFOL (N/A) as a surgical intervention.  The patient's history has been reviewed, patient examined, no change in status, stable for surgery.  I have reviewed the patient's chart and labs.  Questions were answered to the patient's satisfaction.     Rachael Fee

## 2020-10-19 NOTE — Discharge Instructions (Signed)
No follow-up with Dr. Christella Hartigan needed at this time, may follow-up with Dr. Christella Hartigan as needed  Please follow-up with your PCP.  You were Covid positive on 10/18/2020.  If you develop any symptoms please contact your PCP

## 2020-10-19 NOTE — H&P (View-Only) (Signed)
Patient ID: Madison Glover, female   DOB: 12/23/1950, 70 y.o.   MRN: [id number]    Progress Note   Subjective   Day #2   CC; For Colonoscopy  Patient was to leave AMA last evening, apparently her friend talked her into staying.  Patient very unhappy with the whole situation, she did complete another half prep last night. She is demanding her morning meds and says she is worried she will have a stroke and heart attack if she does not get her blood pressure medicine and Zocor this morning-says she will not do the colonoscopy if she does not get her morning meds    Objective   Vital signs in last 24 hours: Temp:  [97.5 F (36.4 C)-98 F (36.7 C)] 97.8 F (36.6 C) (03/25 0606) Pulse Rate:  [66-83] 83 (03/25 0606) Resp:  [16-20] 17 (03/25 0606) BP: (146-158)/(78-101) 146/78 (03/25 0606) SpO2:  [98 %-100 %] 100 % (03/25 0606) Last BM Date: 10/17/20 General:  eld  white female in NAD Heart:  Regular rate and rhythm; no murmurs Lungs: Respirations even and unlabored, lungs CTA bilaterally Abdomen:  Soft, nontender and nondistended. Normal bowel sounds. Extremities:  Without edema. Neurologic:  Alert and oriented,  grossly normal neurologically. Psych:  Cooperative. Normal mood and affect.  Intake/Output from previous day: No intake/output data recorded. Intake/Output this shift: No intake/output data recorded.  Lab Results: Recent Labs    10/17/20 2028  WBC 10.1  HGB 14.4  HCT 45.6  PLT 389   BMET Recent Labs    10/17/20 2028  NA 137  K 3.8  CL 102  CO2 23  GLUCOSE 104*  BUN 29*  CREATININE 1.15*  CALCIUM 9.9   LFT No results for input(s): PROT, ALBUMIN, AST, ALT, ALKPHOS, BILITOT, BILIDIR, IBILI in the last 72 hours. PT/INR No results for input(s): LABPROT, INR in the last 72 hours.  Studies/Results: No results found.     Assessment / Plan:    #1 70-year-old white female with dementia admitted to undergo bowel prep for planned colonoscopy  yesterday 10/18/2020- COVID-19 test returned positive, procedure canceled for yesterday  Patient had been very undecided all day yesterday whether she was staying or leaving AMA. Colonoscopy was rescheduled for today at noon. Patient did complete bowel prep  #2 COVID-19-asymptomatic #3 hypertension 4.  Hyperlipidemia  Plan; Colonoscopy today, plan is for discharge home this afternoon post colonoscopy  Morning meds to be given    Active Problems:   Encounter for screening colonoscopy     LOS: 1 day   Rosalyn Archambault EsterwoodPA-C  10/19/2020, 8:22 AM  

## 2020-10-19 NOTE — Progress Notes (Signed)
Patient ID: Madison Glover, female   DOB: February 10, 1951, 70 y.o.   MRN: [id number]    Progress Note   Subjective   Day #2   CC; For Colonoscopy  Patient was to leave AMA last evening, apparently her friend talked her into staying.  Patient very unhappy with the whole situation, she did complete another half prep last night. She is demanding her morning meds and says she is worried she will have a stroke and heart attack if she does not get her blood pressure medicine and Zocor this morning-says she will not do the colonoscopy if she does not get her morning meds    Objective   Vital signs in last 24 hours: Temp:  [97.5 F (36.4 C)-98 F (36.7 C)] 97.8 F (36.6 C) (03/25 0606) Pulse Rate:  [66-83] 83 (03/25 0606) Resp:  [16-20] 17 (03/25 0606) BP: (146-158)/(78-101) 146/78 (03/25 0606) SpO2:  [98 %-100 %] 100 % (03/25 0606) Last BM Date: 10/17/20 General:  eld  white female in NAD Heart:  Regular rate and rhythm; no murmurs Lungs: Respirations even and unlabored, lungs CTA bilaterally Abdomen:  Soft, nontender and nondistended. Normal bowel sounds. Extremities:  Without edema. Neurologic:  Alert and oriented,  grossly normal neurologically. Psych:  Cooperative. Normal mood and affect.  Intake/Output from previous day: No intake/output data recorded. Intake/Output this shift: No intake/output data recorded.  Lab Results: Recent Labs    10/17/20 2028  WBC 10.1  HGB 14.4  HCT 45.6  PLT 389   BMET Recent Labs    10/17/20 2028  NA 137  K 3.8  CL 102  CO2 23  GLUCOSE 104*  BUN 29*  CREATININE 1.15*  CALCIUM 9.9   LFT No results for input(s): PROT, ALBUMIN, AST, ALT, ALKPHOS, BILITOT, BILIDIR, IBILI in the last 72 hours. PT/INR No results for input(s): LABPROT, INR in the last 72 hours.  Studies/Results: No results found.     Assessment / Plan:    #38 70 year old white female with dementia admitted to undergo bowel prep for planned colonoscopy  yesterday 10/18/2020- COVID-19 test returned positive, procedure canceled for yesterday  Patient had been very undecided all day yesterday whether she was staying or leaving AMA. Colonoscopy was rescheduled for today at noon. Patient did complete bowel prep  #2 COVID-19-asymptomatic #3 hypertension 4.  Hyperlipidemia  Plan; Colonoscopy today, plan is for discharge home this afternoon post colonoscopy  Morning meds to be given    Active Problems:   Encounter for screening colonoscopy     LOS: 1 day   Elayah Klooster EsterwoodPA-C  10/19/2020, 8:22 AM

## 2020-10-19 NOTE — Op Note (Addendum)
[facility] Patient Name: Madison Glover Procedure Date: 10/19/2020 MRN: [id number] Attending MD: Rachael Fee , MD Date of Birth: Aug 13, 1950 CSN: [id number] Age: 70 Admit Type: Inpatient Procedure:                Colonoscopy Indications:              Positive Cologuard test Providers:                Rachael Fee, MD, Janae Sauce. Steele Berg, RN, Brion Aliment, Technician Referring MD:              Medicines:                Midazolam 3 mg IV, Fentanyl 37.5 micrograms IV Complications:            No immediate complications. Estimated blood loss:                            None. Estimated Blood Loss:     Estimated blood loss: none. Procedure:                Pre-Anesthesia Assessment:                           - Prior to the procedure, a History and Physical                            was performed, and patient medications and                            allergies were reviewed. The patient's tolerance of                            previous anesthesia was also reviewed. The risks                            and benefits of the procedure and the sedation                            options and risks were discussed with the patient.                            All questions were answered, and informed consent                            was obtained. Prior Anticoagulants: The patient has                            taken no previous anticoagulant or antiplatelet                            agents. ASA Grade Assessment: III - A patient with  severe systemic disease. After reviewing the risks                            and benefits, the patient was deemed in                            satisfactory condition to undergo the procedure.                           After obtaining informed consent, the colonoscope                            was passed under direct vision. Throughout the                            procedure, the  patient's blood pressure, pulse, and                            oxygen saturations were monitored continuously. The                            CF-HQ190L ([id number]) Olympus colonoscope was                            introduced through the anus and advanced to the the                            cecum, identified by appendiceal orifice and                            ileocecal valve. The colonoscopy was performed                            without difficulty. The patient tolerated the                            procedure well. The quality of the bowel                            preparation was good. The ileocecal valve,                            appendiceal orifice, and rectum were photographed. Scope In: 11:41:57 AM Scope Out: 11:52:49 AM Scope Withdrawal Time: 0 hours 7 minutes 47 seconds  Total Procedure Duration: 0 hours 10 minutes 52 seconds  Findings:      A 8 mm polyp was found in the transverse colon. The polyp was sessile.       The polyp was removed with a cold snare. Resection and retrieval were       complete.      Multiple small and large-mouthed diverticula were found in the entire       colon.      The exam was otherwise without abnormality on direct and retroflexion       views. Impression:               -  One 8 mm polyp in the transverse colon, removed                            with a cold snare. Resected and retrieved.                           - Diverticulosis in the entire examined colon.                           - The examination was otherwise normal on direct                            and retroflexion views. Moderate Sedation:      Moderate (conscious) sedation was administered by the endoscopy nurse       and supervised by the endoscopist. The following parameters were       monitored: oxygen saturation, heart rate, blood pressure, and response       to care. Total physician intraservice time was 15 minutes. Recommendation:           - Await pathology results.                            - OK to discharge home today. Procedure Code(s):        --- Professional ---                           [id number], Colonoscopy, flexible; with removal of                            tumor(s), polyp(s), or other lesion(s) by snare                            technique Diagnosis Code(s):        --- Professional ---                           K63.5, Polyp of colon                           R19.5, Other fecal abnormalities                           K57.30, Diverticulosis of large intestine without                            perforation or abscess without bleeding CPT copyright 2019 American Medical Association. All rights reserved. The codes documented in this report are preliminary and upon coder review may  be revised to meet current compliance requirements. Rachael Fee, MD 10/19/2020 11:59:10 AM This report has been signed electronically. Number of Addenda: 0

## 2020-10-22 ENCOUNTER — Encounter (HOSPITAL_COMMUNITY): Payer: Self-pay | Admitting: Gastroenterology

## 2020-10-22 ENCOUNTER — Other Ambulatory Visit: Payer: Self-pay | Admitting: Internal Medicine

## 2020-10-22 LAB — SURGICAL PATHOLOGY

## 2020-10-24 ENCOUNTER — Telehealth: Payer: Self-pay

## 2020-10-24 NOTE — Telephone Encounter (Signed)
-----   Message from Loretha Stapler, RN sent at 10/18/2020 10:51 AM EDT ----- She was admitted for bowel prep last night for colonoscopy today. Pre-procedure covid test this morning was positive.  Logistically I am unable to perform the colonoscopy today with room availability now.  We rescheduled and will be able to do the colonoscoy tomorrow duing my lunch hour however she is not will to do that and instead wants to go home.  cany you contact her next week about repeat colonoscopy attempt. After talking with her and her caregivers more I think she should be able to safely prep at home next time. Her caregivers understand that will require extra attention on their part.  [facility] Thursday colonoscopy, prepping at home, my next available.  Thanks

## 2020-10-25 ENCOUNTER — Telehealth: Payer: Self-pay | Admitting: Gastroenterology

## 2020-10-25 ENCOUNTER — Other Ambulatory Visit: Payer: Self-pay

## 2020-10-25 DIAGNOSIS — R195 Other fecal abnormalities: Secondary | ICD-10-CM

## 2020-10-25 MED ORDER — PEG 3350-KCL-NA BICARB-NACL 420 G PO SOLR: 4000.0000 mL | Freq: Once | ORAL | 0 refills | Status: AC

## 2020-10-25 NOTE — Telephone Encounter (Signed)
Left message on machine to call back  

## 2020-10-25 NOTE — Telephone Encounter (Signed)
The pt has been rescheduled to 12/20/20 at [facility] with Dr Christella Hartigan at 930 am.  Prep has been resent. She does not need COVID test.

## 2020-10-25 NOTE — Telephone Encounter (Signed)
See alternate results  note 3/31

## 2020-10-25 NOTE — Telephone Encounter (Signed)
I spoke with the POA and the colon was actually completed on 3/25.

## 2020-11-06 ENCOUNTER — Encounter: Payer: Self-pay | Admitting: Neurology

## 2020-11-06 ENCOUNTER — Ambulatory Visit: Payer: Medicare PPO | Admitting: Neurology

## 2020-11-06 ENCOUNTER — Other Ambulatory Visit: Payer: Self-pay

## 2020-11-06 VITALS — BP 141/84 | HR 63 | Ht <= 58 in | Wt 145.0 lb

## 2020-11-06 DIAGNOSIS — G319 Degenerative disease of nervous system, unspecified: Secondary | ICD-10-CM

## 2020-11-06 DIAGNOSIS — R413 Other amnesia: Secondary | ICD-10-CM | POA: Diagnosis not present

## 2020-11-06 MED ORDER — DONEPEZIL HCL 10 MG PO TABS: 10.0000 mg | ORAL_TABLET | Freq: Every day | ORAL | 3 refills | Status: AC

## 2020-11-06 MED ORDER — MEMANTINE HCL 10 MG PO TABS: 10.0000 mg | ORAL_TABLET | Freq: Two times a day (BID) | ORAL | 3 refills | Status: AC
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
## 2020-11-06 NOTE — Patient Instructions (Signed)
Continue current medications Will check with primary care about case management services Recommend further supervision  Referral to psychiatry, geriatrics See you back in 6 months

## 2020-11-26 ENCOUNTER — Other Ambulatory Visit: Payer: Self-pay

## 2020-11-26 ENCOUNTER — Ambulatory Visit
Admission: RE | Admit: 2020-11-26 | Discharge: 2020-11-26 | Disposition: A | Discharge status: Home / Self Care | Payer: Medicare PPO | Source: Ambulatory Visit | Attending: Internal Medicine | Admitting: Internal Medicine

## 2020-11-26 ENCOUNTER — Ambulatory Visit: Payer: Medicare PPO

## 2020-11-26 DIAGNOSIS — Z1231 Encounter for screening mammogram for malignant neoplasm of breast: Secondary | ICD-10-CM

## 2020-11-27 ENCOUNTER — Other Ambulatory Visit: Payer: Self-pay | Admitting: Internal Medicine

## 2020-11-27 DIAGNOSIS — R928 Other abnormal and inconclusive findings on diagnostic imaging of breast: Secondary | ICD-10-CM

## 2020-12-20 ENCOUNTER — Ambulatory Visit (HOSPITAL_COMMUNITY): Admit: 2020-12-20 | Payer: Medicare PPO | Admitting: Gastroenterology

## 2020-12-20 ENCOUNTER — Encounter (HOSPITAL_COMMUNITY): Payer: Self-pay

## 2020-12-20 SURGERY — COLONOSCOPY WITH PROPOFOL
Anesthesia: Monitor Anesthesia Care

## 2020-12-25 ENCOUNTER — Other Ambulatory Visit: Payer: Self-pay | Admitting: Internal Medicine

## 2020-12-25 ENCOUNTER — Ambulatory Visit
Admission: RE | Admit: 2020-12-25 | Discharge: 2020-12-25 | Disposition: A | Discharge status: Home / Self Care | Payer: Medicare PPO | Source: Ambulatory Visit | Attending: Internal Medicine | Admitting: Internal Medicine

## 2020-12-25 DIAGNOSIS — R928 Other abnormal and inconclusive findings on diagnostic imaging of breast: Secondary | ICD-10-CM

## 2020-12-25 DIAGNOSIS — N6489 Other specified disorders of breast: Secondary | ICD-10-CM | POA: Diagnosis not present

## 2020-12-28 ENCOUNTER — Other Ambulatory Visit: Payer: Self-pay | Admitting: Internal Medicine

## 2021-01-13 ENCOUNTER — Other Ambulatory Visit: Payer: Self-pay | Admitting: Internal Medicine

## 2021-01-13 DIAGNOSIS — M810 Age-related osteoporosis without current pathological fracture: Secondary | ICD-10-CM

## 2021-01-29 ENCOUNTER — Ambulatory Visit: Payer: Medicare PPO | Admitting: Internal Medicine

## 2021-01-31 ENCOUNTER — Other Ambulatory Visit: Payer: Self-pay

## 2021-02-01 ENCOUNTER — Encounter: Payer: Self-pay | Admitting: Internal Medicine

## 2021-02-01 ENCOUNTER — Ambulatory Visit: Payer: Medicare PPO | Admitting: Internal Medicine

## 2021-02-01 VITALS — BP 130/90 | HR 64 | Temp 98.2°F | Wt 132.6 lb

## 2021-02-01 DIAGNOSIS — M810 Age-related osteoporosis without current pathological fracture: Secondary | ICD-10-CM

## 2021-02-01 DIAGNOSIS — G319 Degenerative disease of nervous system, unspecified: Secondary | ICD-10-CM

## 2021-02-01 DIAGNOSIS — E538 Deficiency of other specified B group vitamins: Secondary | ICD-10-CM

## 2021-02-01 DIAGNOSIS — E559 Vitamin D deficiency, unspecified: Secondary | ICD-10-CM

## 2021-02-01 DIAGNOSIS — F339 Major depressive disorder, recurrent, unspecified: Secondary | ICD-10-CM

## 2021-02-01 DIAGNOSIS — E7800 Pure hypercholesterolemia, unspecified: Secondary | ICD-10-CM | POA: Diagnosis not present

## 2021-02-01 DIAGNOSIS — R195 Other fecal abnormalities: Secondary | ICD-10-CM | POA: Diagnosis not present

## 2021-02-01 DIAGNOSIS — I10 Essential (primary) hypertension: Secondary | ICD-10-CM | POA: Diagnosis not present

## 2021-02-01 NOTE — Progress Notes (Signed)
Established Patient Office Visit     This visit occurred during the SARS-CoV-2 public health emergency.  Safety protocols were in place, including screening questions prior to the visit, additional usage of staff PPE, and extensive cleaning of exam room while observing appropriate contact time as indicated for disinfecting solutions.    CC/Reason for Visit: 38-month follow-up chronic medical conditions  HPI: Madison Glover is a 70 y.o. female who is coming in today for the above mentioned reasons. Past Medical History is significant for: Dementia/cognitive disorder followed by neurology and neuropsychology, hypertension, hyperlipidemia, osteoporosis, vitamin D and B12 deficiencies.  She is here today with her 2 close friends who are her financial and medical power of attorney.  She continues to tell me how depressed she is about the fact that her driver's license was revoked.  Her friends have concerns about her crossing busy streets which she does almost daily to go shopping, to go to the grocery store.  They are also concerned because they believe she is drinking more than she admits to.  They have found multiple empty bottles at home at times.  Multiple missed doses of medications.  One of the friends will sometimes find her jewelry box full of pills.  They have contracted with a company that we will start providing an aide service for 3 days a week to help with house care and transportation.  She has refused placement at a senior living facility.  She has no relationship with her daughter.  She remains very repetitive with clear cognitive issues.  She would like to leave the house more frequently.  She is inquiring about senior citizen activities.   Past Medical/Surgical History: Past Medical History:  Diagnosis Date   Allergy    Anxiety    High cholesterol    Hypertension    Memory loss    Osteoporosis    Vitamin D deficiency     Past Surgical History:  Procedure  Laterality Date   COLONOSCOPY WITH PROPOFOL N/A 10/19/2020   Procedure: COLONOSCOPY WITH PROPOFOL;  Surgeon: Rachael Fee, MD;  Location: [facility];  Service: Endoscopy;  Laterality: N/A;   LAPAROSCOPY     POLYPECTOMY  10/19/2020   Procedure: POLYPECTOMY;  Surgeon: Rachael Fee, MD;  Location: [facility];  Service: Endoscopy;;   TONSILLECTOMY      Social History:  reports that she has quit smoking. Her smoking use included cigarettes. She has never used smokeless tobacco. She reports current alcohol use. She reports that she does not use drugs.  Allergies: Allergies  Allergen Reactions   Bee Venom Swelling    Family History:  Family History  Problem Relation Age of Onset   Breast cancer Mother    COPD Father    Heart disease Father    Hypertension Father    Cancer Brother        type unknown   Colon cancer Neg Hx    Esophageal cancer Neg Hx    Stomach cancer Neg Hx    Rectal cancer Neg Hx      Current Outpatient Medications:    alendronate (FOSAMAX) 70 MG tablet, TAKE 1 TABLET BY MOUTH EVERY 7 DAYS WITH A FULL GLASS OF WATER ON AN EMPTY STOMACH., Disp: 12 tablet, Rfl: 1   citalopram (CELEXA) 20 MG tablet, Take 1 tablet (20 mg total) by mouth daily., Disp: 90 tablet, Rfl: 1   donepezil (ARICEPT) 10 MG tablet, Take 1 tablet (10 mg total) by mouth  at bedtime., Disp: 90 tablet, Rfl: 3   hydrochlorothiazide (HYDRODIURIL) 25 MG tablet, Take 1 tablet (25 mg total) by mouth daily., Disp: 90 tablet, Rfl: 1   memantine (NAMENDA) 10 MG tablet, Take 1 tablet (10 mg total) by mouth 2 (two) times daily., Disp: 180 tablet, Rfl: 3   metoprolol succinate (TOPROL-XL) 100 MG 24 hr tablet, TAKE 1 TABLET BY MOUTH EVERY DAY, Disp: 90 tablet, Rfl: 1   Metoprolol Succinate 100 MG CS24, Take 100 mg by mouth daily., Disp: 90 capsule, Rfl: 1   mupirocin ointment (BACTROBAN) 2 %, Place 1 application into the nose 2 (two) times daily., Disp: 22 g, Rfl: 0   simvastatin (ZOCOR) 20 MG tablet,  TAKE 1 TABLET (20 MG TOTAL) BY MOUTH DAILY AT 6 PM., Disp: 90 tablet, Rfl: 1   vitamin B-12 (CYANOCOBALAMIN) 1000 MCG tablet, Take 1,000 mcg by mouth daily., Disp: , Rfl:   Review of Systems:  Constitutional: Denies fever, chills, diaphoresis, appetite change and fatigue.  HEENT: Denies photophobia, eye pain, redness, hearing loss, ear pain, congestion, sore throat, rhinorrhea, sneezing, mouth sores, trouble swallowing, neck pain, neck stiffness and tinnitus.   Respiratory: Denies SOB, DOE, cough, chest tightness,  and wheezing.   Cardiovascular: Denies chest pain, palpitations and leg swelling.  Gastrointestinal: Denies nausea, vomiting, abdominal pain, diarrhea, constipation, blood in stool and abdominal distention.  Genitourinary: Denies dysuria, urgency, frequency, hematuria, flank pain and difficulty urinating.  Endocrine: Denies: hot or cold intolerance, sweats, changes in hair or nails, polyuria, polydipsia. Musculoskeletal: Denies myalgias, back pain, joint swelling, arthralgias and gait problem.  Skin: Denies pallor, rash and wound.  Neurological: Denies dizziness, seizures, syncope, weakness, light-headedness, numbness and headaches.  Hematological: Denies adenopathy. Easy bruising, personal or family bleeding history  Psychiatric/Behavioral: Denies  nervousness, sleep disturbance and agitation    Physical Exam: Vitals:   02/01/21 1112  BP: 130/90  Pulse: 64  Temp: 98.2 F (36.8 C)  TempSrc: Oral  SpO2: 99%  Weight: 132 lb 9.6 oz (60.1 kg)    Body mass index is 28.69 kg/m.   Constitutional: NAD, calm, comfortable Eyes: PERRL, lids and conjunctivae normal, wears corrective lenses ENMT: Mucous membranes are moist.  Respiratory: clear to auscultation bilaterally, no wheezing, no crackles. Normal respiratory effort. No accessory muscle use.  Cardiovascular: Regular rate and rhythm, no murmurs / rubs / gallops. No extremity edema. 2+ pedal pulses. No carotid bruits.     Impression and Plan:  Neurodegenerative cognitive impairment (HCC)  - Plan: AMB Referral to [facility] for case management and social work to see what options are available in our community.  She is interested in senior citizen activities as well as transportation options. -Continue Namenda, continue follow-up with neurology and neuropsychology.  Primary hypertension -Blood pressure is elevated, suspect due to missed doses of medication. -Caregivers tell me that they have contracted with a home service company that we will start providing aide services next week.  Age-related osteoporosis without current pathological fracture -On weekly alendronate, not sure if she is 100% compliant with treatment.  Positive colorectal cancer screening using Cologuard test -She had a colonoscopy in March with recommendations for a 5-year follow-up.  High cholesterol -Last lipid panel in January 2022 with total cholesterol 230, triglycerides 144 and LDL 149. -She is on simvastatin 20 mg daily.  Depression, recurrent (HCC) Flowsheet Row Office Visit from 02/01/2021 in [facility] [place]  PHQ-9 Total Score 4      -Followed by neurology, it looks like  they are in the process of referring her to psychiatry as well.  Vitamin B12 deficiency -Recheck levels at next visit.  Vitamin D deficiency -Recheck levels at next visit.  Time spent: 46 minutes reviewing chart, interviewing and examining patient, interviewing present caregivers and formulating plan of care.     [name], MD [facility]

## 2021-02-03 ENCOUNTER — Other Ambulatory Visit: Payer: Self-pay | Admitting: Internal Medicine

## 2021-02-04 ENCOUNTER — Telehealth: Payer: Self-pay | Admitting: *Deleted

## 2021-02-04 NOTE — Chronic Care Management (AMB) (Signed)
  Chronic Care Management   Note  02/04/2021 Name: Madison Glover MRN: [id number] DOB: [birth year]  Madison Glover is a 70 y.o. year old female who is a primary care patient of Isaac Bliss, Rayford Halsted, MD. I reached out to Madison Glover by phone today in response to a referral sent by Madison Glover's PCP, Isaac Bliss, Rayford Halsted, MD      Madison Glover was given information about Chronic Care Management services today including:  CCM service includes personalized support from designated clinical staff supervised by her physician, including individualized plan of care and coordination with other care providers 24/7 contact phone numbers for assistance for urgent and routine care needs. Service will only be billed when office clinical staff spend 20 minutes or more in a month to coordinate care. Only one practitioner may furnish and bill the service in a calendar month. The patient may stop CCM services at any time (effective at the end of the month) by phone call to the office staff. The patient will be responsible for cost sharing (co-pay) of up to 20% of the service fee (after annual deductible is met).  Madison Glover verbally agreed to services and verbal consent obtained.   Follow up plan: Telephone appointment with care management team member scheduled for:02/18/2021  [name]  Care Guide, Embedded Care Coordination [place]  Care Management

## 2021-02-07 ENCOUNTER — Telehealth: Payer: Self-pay

## 2021-02-07 NOTE — Telephone Encounter (Signed)
   Telephone encounter was:  Unsuccessful.  02/07/2021 Name: Fayrene Towner Crowell MRN: [id number] DOB: 1950/11/03  Unsuccessful outbound call made today to assist with:  Left message on voicemail for patient's POA [name] to return my call regarding senior citizen activities.  Outreach Attempt:  1st Attempt  A HIPAA compliant voice message was left requesting a return call.  Instructed patient to call back at [phone].  Swayzee Wadley, AAS Paralegal, [facility] Care Guide  Embedded Care Coordination [place]  Care Management  [address] [place], [place] [address] ??[email or web address]  ?? [phone]   [email or web address]

## 2021-02-18 ENCOUNTER — Ambulatory Visit (INDEPENDENT_AMBULATORY_CARE_PROVIDER_SITE_OTHER): Payer: Medicare PPO

## 2021-02-18 DIAGNOSIS — E7800 Pure hypercholesterolemia, unspecified: Secondary | ICD-10-CM | POA: Diagnosis not present

## 2021-02-18 DIAGNOSIS — I10 Essential (primary) hypertension: Secondary | ICD-10-CM | POA: Diagnosis not present

## 2021-02-18 DIAGNOSIS — G319 Degenerative disease of nervous system, unspecified: Secondary | ICD-10-CM

## 2021-02-18 NOTE — Patient Instructions (Signed)
Visit Information   PATIENT GOALS:   Goals Addressed             This Visit's Progress    RNCM:Planning for Long-Term Care-Dementia   On track    Timeframe:  Long-Range Goal Priority:  High Start Date:      02/18/21                       Expected End Date:      07/28/21                 Follow Up Date 03/22/21    - attend dementia support group - check out dementia website - check out other places when staying at home is no longer possible (assisted living center, nursing home) - check out services like in-home help or adult day care - check with a financial planner - connect with the local or national dementia organization - hold a family meeting with family members and loved ones - list the symptoms that would make staying at home too hard - make a list of future care and financial needs - make a list of people who can help and what they can do    Why is this important?   Learning that you or your loved one has dementia can be scary and stressful.  You can reduce stress by planning.  Preparing for the future is one of the most important things to do.  Thinking about how much care you/your loved one will need and how much it will cost is not easy.  Early on, you/your loved one can be part of making decisions for the future.  Making sure that your/your loved one's wishes for care are known is important.     Notes:      RNCM:Track and Manage My Blood Pressure-Hypertension   On track    Timeframe:  Long-Range Goal Priority:  Medium Start Date:        02/18/21                     Expected End Date:     07/28/21                  Follow Up Date 03/22/21    - check blood pressure weekly - choose a place to take my blood pressure (home, clinic or office, retail store) - write blood pressure results in a log or diary    Why is this important?   You won't feel high blood pressure, but it can still hurt your blood vessels.  High blood pressure can cause heart or kidney problems. It  can also cause a stroke.  Making lifestyle changes like losing a little weight or eating less salt will help.  Checking your blood pressure at home and at different times of the day can help to control blood pressure.  If the doctor prescribes medicine remember to take it the way the doctor ordered.  Call the office if you cannot afford the medicine or if there are questions about it.     Notes:       https://point-[email or web address]-care.elsevierperformancemanager.com/skills">  Dementia Caregiver Guide Dementia is a term used to describe a number of symptoms that affect memory and thinking. The most common symptoms include: Memory loss. Trouble with language and communication. Trouble concentrating. Poor judgment and problems with reasoning. Wandering from home or public places. Extreme anxiety or depression. Being suspicious or having angry outbursts and  accusations. Child-like behavior and language. Dementia can be frightening and confusing. And taking care of someone with dementia can be challenging. This guide provides tips to help you whenproviding care for a person with dementia. How to help manage lifestyle changes Dementia usually gets worse slowly over time. In the early stages, people with dementia can stay independent and safe with some help. In later stages, they need help with daily tasks such as dressing, grooming, and using the bathroom. There are actions you can take to help a person manage his or her life whileliving with this condition. Communicating When the person is talking or seems frustrated, make eye contact and hold the person's hand. Ask specific questions that need yes or no answers. Use simple words, short sentences, and a calm voice. Only give one direction at a time. When offering choices, limit the person to just one or two. Avoid correcting the person in a negative way. If the person is struggling to find the right words, gently try to help him or her. Preventing  injury  Keep floors clear of clutter. Remove rugs, magazine racks, and floor lamps. Keep hallways well lit, especially at night. Put a handrail and nonslip mat in the bathtub or shower. Put childproof locks on cabinets that contain dangerous items, such as medicines, alcohol, guns, toxic cleaning items, sharp tools or utensils, matches, and lighters. For doors to the outside of the house, put the locks in places where the person cannot see or reach them easily. This will help ensure that the person does not wander out of the house and get lost. Be prepared for emergencies. Keep a list of emergency phone numbers and addresses in a convenient area. Remove car keys and lock garage doors so that the person does not try to get in the car and drive. Have the person wear a bracelet that tracks locations and identifies the person as having memory problems. This should be worn at all times for safety.  Helping with daily life  Keep the person on track with his or her routine. Try to identify areas where the person may need help. Be supportive, patient, calm, and encouraging. Gently remind the person that adjusting to changes takes time. Help with the tasks that the person has asked for help with. Keep the person involved in daily tasks and decisions as much as possible. Encourage conversation, but try not to get frustrated if the person struggles to find words or does not seem to appreciate your help.  How to recognize stress Look for signs of stress in yourself and in the person you are caring for. If you notice signs of stress, take steps to manage it. Symptoms of stress include: Feeling anxious, irritable, frustrated, or angry. Denying that the person has dementia or that his or her symptoms will not improve. Feeling depressed, hopeless, or unappreciated. Difficulty sleeping. Difficulty concentrating. Developing stress-related health problems. Feeling like you have too little time for your own  life. Follow these instructions at home: Take care of your health Make sure that you and the person you are caring for: Get regular sleep. Exercise regularly. Eat regular, nutritious meals. Take over-the-counter and prescription medicines only as told by your health care providers. Drink enough fluid to keep your urine pale yellow. Attend all scheduled health care appointments.  General instructions Join a support group with others who are caregivers. Ask about respite care resources. Respite care can provide short-term care for the person so that you can have a regular  break from the stress of caregiving. Consider any safety risks and take steps to avoid them. Organize medicines in a pill box for each day of the week. Create a plan to handle any legal or financial matters. Get legal or financial advice if needed. Keep a calendar in a central location to remind the person of appointments or other activities. Where to find support: Many individuals and organizations offer support. These include: Support groups for people with dementia. Support groups for caregivers. Counselors or therapists. Home health care services. Adult day care centers. Where to find more information Centers for Disease Control and Prevention: http://www.wolf.info/ Alzheimer's Association: CapitalMile.co.nz Family Caregiver Alliance: www.caregiver.[place]: www.alzfdn.org Contact a health care provider if: The person's health is rapidly getting worse. You are no longer able to care for the person. Caring for the person is affecting your physical and emotional health. You are feeling depressed or anxious about caring for the person. Get help right away if: The person threatens himself or herself, you, or anyone else. You feel depressed or sad, or feel that you want to harm yourself. If you ever feel like your loved one may hurt himself or herself or others, or if he or she shares thoughts about  taking his or her own life, get help right away. You can go to your nearest emergency department or: Call your local emergency services (911 in the [place] Call a suicide crisis helpline, such as the [place] at 414-323-8100. This is open 24 hours a day in the [place] Text the Crisis Text Line at (250)473-2220 (in the [place].). Summary Dementia is a term used to describe a number of symptoms that affect memory and thinking. Dementia usually gets worse slowly over time. Take steps to reduce the person's risk of injury and to plan for future care. Caregivers need support, relief from caregiving, and time for their own lives. This information is not intended to replace advice given to you by your health care provider. Make sure you discuss any questions you have with your healthcare provider. Document Revised: 11/28/2019 Document Reviewed: 11/28/2019 Elsevier Patient Education  2022 [employer].   Consent to CCM Services: Ms. Hottenstein was given information about Chronic Care Management services today including:  CCM service includes personalized support from designated clinical staff supervised by her physician, including individualized plan of care and coordination with other care providers 24/7 contact phone numbers for assistance for urgent and routine care needs. Service will only be billed when office clinical staff spend 20 minutes or more in a month to coordinate care. Only one practitioner may furnish and bill the service in a calendar month. The patient may stop CCM services at any time (effective at the end of the month) by phone call to the office staff. The patient will be responsible for cost sharing (co-pay) of up to 20% of the service fee (after annual deductible is met).  Patient agreed to services and verbal consent obtained.   The patient verbalized understanding of instructions, educational materials, and care plan provided today and agreed to receive a mailed  copy of patient instructions, educational materials, and care plan.   Telephone follow up appointment with care management team member scheduled for: Peter Garter RN, [facility], CDE Care Management Coordinator [facility] [phone], Mobile [phone]   CLINICAL CARE PLAN: Patient Care Plan: RNCM:Dementia (Adult)     Problem Identified: Potential of Harm or Injury related to dementia   Priority: High  Long-Range Goal: Harm or Injury Prevented related to dementia   Start Date: 02/18/2021  Expected End Date: 07/28/2021  This Visit's Progress: On track  Priority: High  Note:   Current Barriers:  Ineffective Self Health Maintenance in a patient with Dementia Unable to independently self manage dementia Unable to self administer medications as prescribed Unable to perform IADLs independently Spoke with [place] friend and brother Frederico Hamman Ridings(lives in [place]).  States that pt has become more forgetful and not taking care of herself over the last year or so.  [name] states that she, Letta Median Henry(DPOA) and another friend from church Ellie help pt with keeping up with appointments, medications and paying bills.  States that they have just hired a aide to come for 3 hours a week to help pt as needed.  States pt no longer drives and has had her  drivers licence taken away.  States she walks to store that is near by to shop sometimes.  States she is estranged from one of her daughters and her other daughter lives in [place].  States that they are concerned that pt will not be able to safely stay alone and will need to move to assisted living.  States that Ellie fills pts medication boxes for the week and that pt ofter does not take her bedtime pill.   Clinical Goal(s):  Collaboration with Isaac Bliss, Rayford Halsted, MD regarding development and update of comprehensive plan of care as evidenced by provider attestation and co-signature Inter-disciplinary care  team collaboration (see longitudinal plan of care) patient will work with care management team to address care coordination and chronic disease management needs related to Disease Management Educational Needs Care Coordination Medication Management and Education Psychosocial Support Caregiver Stress support Dementia and Caregiver Support Level of Care Concerns   Interventions:  Evaluation of current treatment plan related to HTN, HLD, and Dementia, Level of care concerns, ADL IADL limitations, Family and relationship dysfunction, Cognitive Deficits, Memory Deficits, Inability to perform IADL's independently, and Lacks knowledge of community resource: dementia  self-management and patient's adherence to plan as established by provider. Collaboration with Isaac Bliss, Rayford Halsted, MD regarding development and update of comprehensive plan of care as evidenced by provider attestation       and co-signature Inter-disciplinary care team collaboration (see longitudinal plan of care) Discussed plans with patient for ongoing care management follow up and provided patient with direct contact information for care management team Referred to CCM social works for level of care issues, caregiver stress and resources Referred to CCM pharmacist for medication adherence issues, possible simplification of medication regimen, and pill packaging  Reviewed home safety issues with caregivers   Self Care Activities:  Self administers medications as prescribed Attends all scheduled provider appointments Calls pharmacy for medication refills Attends church or other social activities Calls provider office for new concerns or questions Patient Goals: - attend dementia support group - check out dementia website - check out other places when staying at home is no longer possible (assisted living center, nursing home) - check out services like in-home help or adult day care - check with a financial planner -  connect with the local or national dementia organization - hold a family meeting with family members and loved ones - list the symptoms that would make staying at home too hard - make a list of future care and financial needs - make a list of people who can help and what they can do Follow Up Plan:  Telephone follow up appointment with care management team member scheduled for: 03/22/21 The patient has been provided with contact information for the care management team and has been advised to call with any health related questions or concerns.      Patient Care Plan: RNCM:Cardiovascular disease (HTN and HLD)     Problem Identified: Lack of long term Cardiovascular disease Management (HTN and HLD)   Priority: Medium     Long-Range Goal: Effective Cardiovascular disease Self Management (HTN and HLD)   Start Date: 02/18/2021  Expected End Date: 07/28/2021  This Visit's Progress: On track  Priority: Medium  Note:   Current Barriers:  Knowledge Deficits related to basic understanding of Cardiovascular disease (HTN and HLD) pathophysiology and self care management Unable to independently Self manage Cardiovascular disease (HTN and HLD) Unable to self administer medications as prescribed Unable to perform IADLs independently Caregiver states that pt does not check B/P at home and does not have a monitor.  Caregiver(HCPOA )states she has monitor and will check B/P Nurse Case Manager Clinical Goal(s):  patient will verbalize understanding of plan for hypertension management patient will not experience hospital admission. Hospital Admissions in last 6 months = 0 patient will attend all scheduled medical appointments: no upcoming primary care provider patient will demonstrate improved adherence to prescribed treatment plan for hypertension as evidenced by taking all medications as prescribed, monitoring and recording blood pressure as directed, adhering to low sodium/DASH diet patient will  demonstrate improved health management independence as evidenced by checking blood pressure as directed and notifying PCP if SBP>160 or DBP > 90, taking all medications as prescribe, and adhering to a low sodium diet as discussed. patient will verbalize basic understanding of hypertension disease process and self health management plan as evidenced by readings within limits and adherence to medications  Interventions:  Collaboration with Isaac Bliss, Rayford Halsted, MD regarding development and update of comprehensive plan of care as evidenced by provider attestation and co-signature Inter-disciplinary care team collaboration (see longitudinal plan of care) Evaluation of current treatment plan related to hypertension self management and patient's adherence to plan as established by provider. Provided education to patient re: stroke prevention, s/s of heart attack and stroke, DASH diet, complications of uncontrolled blood pressure Reviewed medications with patient and discussed importance of compliance Discussed plans with patient for ongoing care management follow up and provided patient with direct contact information for care management team Advised patient, providing education and rationale, to monitor blood pressure daily and record, calling PCP for findings outside established parameters.  Reviewed scheduled/upcoming provider appointments including: no upcoming primary care provider Reviewed to contact pts insurance company to order B/P monitor from over the counter catalog  Referred to CCM social works for level of care issues, caregiver stress and resources Referred to CCM pharmacist for medication adherence issues, possible simplification of medication regimen, and pill packaging  Self-Care Activities:  Self administers medications as prescribed Attends all scheduled provider appointments Calls pharmacy for medication refills Attends church or other social activities Calls provider office  for new concerns or questions Patient Goals  - Self administer medications as prescribed  - Attend all scheduled provider appointments  - Call provider office for new concerns, questions, or BP outside discussed parameters  - Check BP and record as discussed  - Follows a low sodium diet/DASH diet - check blood pressure weekly - choose a place to take my blood pressure (home, clinic or office, retail store) - write blood pressure results in a log  or diary - ask questions to understand Follow Up Plan: Telephone follow up appointment with care management team member scheduled for: 03/22/21 The patient has been provided with contact information for the care management team and has been advised to call with any health related questions or concerns.

## 2021-02-18 NOTE — Chronic Care Management (AMB) (Signed)
Chronic Care Management   CCM RN Visit Note  02/18/2021 Name: Madison Glover MRN: [id number] DOB: 11/27/1950  Subjective: Madison Glover is a 70 y.o. year old female who is a primary care patient of Isaac Bliss, Rayford Halsted, MD. The care management team was consulted for assistance with disease management and care coordination needs.    Engaged with patient by telephone for initial visit in response to provider referral for case management and/or care coordination services.   Consent to Services:  The patient was given the following information about Chronic Care Management services today, agreed to services, and gave verbal consent: 1. CCM service includes personalized support from designated clinical staff supervised by the primary care provider, including individualized plan of care and coordination with other care providers 2. 24/7 contact phone numbers for assistance for urgent and routine care needs. 3. Service will only be billed when office clinical staff spend 20 minutes or more in a month to coordinate care. 4. Only one practitioner may furnish and bill the service in a calendar month. 5.The patient may stop CCM services at any time (effective at the end of the month) by phone call to the office staff. 6. The patient will be responsible for cost sharing (co-pay) of up to 20% of the service fee (after annual deductible is met). Patient agreed to services and consent obtained.  Patient agreed to services and verbal consent obtained.   Assessment: Review of patient past medical history, allergies, medications, health status, including review of consultants reports, laboratory and other test data, was performed as part of comprehensive evaluation and provision of chronic care management services.   SDOH (Social Determinants of Health) assessments and interventions performed:  SDOH Interventions    Flowsheet Row Most Recent Value  SDOH Interventions   Food Insecurity  Interventions Intervention Not Indicated  Financial Strain Interventions Intervention Not Indicated  [DPOA pays bills]  Housing Interventions Intervention Not Indicated  Transportation Interventions Intervention Not Indicated  [reviewed transportation benefit with [employer]]  Depression Interventions/Treatment  Medication        CCM Care Plan  Allergies  Allergen Reactions   Bee Venom Swelling    Outpatient Encounter Medications as of 02/18/2021  Medication Sig   alendronate (FOSAMAX) 70 MG tablet TAKE 1 TABLET BY MOUTH EVERY 7 DAYS WITH A FULL GLASS OF WATER ON AN EMPTY STOMACH.   citalopram (CELEXA) 20 MG tablet Take 1 tablet (20 mg total) by mouth daily.   donepezil (ARICEPT) 10 MG tablet Take 1 tablet (10 mg total) by mouth at bedtime.   hydrochlorothiazide (HYDRODIURIL) 25 MG tablet TAKE 1 TABLET BY MOUTH EVERY DAY   memantine (NAMENDA) 10 MG tablet Take 1 tablet (10 mg total) by mouth 2 (two) times daily.   metoprolol succinate (TOPROL-XL) 100 MG 24 hr tablet TAKE 1 TABLET BY MOUTH EVERY DAY   Metoprolol Succinate 100 MG CS24 Take 100 mg by mouth daily.   mupirocin ointment (BACTROBAN) 2 % Place 1 application into the nose 2 (two) times daily.   simvastatin (ZOCOR) 20 MG tablet TAKE 1 TABLET (20 MG TOTAL) BY MOUTH DAILY AT 6 PM.   vitamin B-12 (CYANOCOBALAMIN) 1000 MCG tablet Take 1,000 mcg by mouth daily.   No facility-administered encounter medications on file as of 02/18/2021.    Patient Active Problem List   Diagnosis Date Noted   Neurodegenerative cognitive impairment ([place]) 11/06/2020   Positive colorectal cancer screening using Cologuard test    Encounter for screening colonoscopy 10/17/2020  Vitamin D deficiency 07/31/2020   Osteoporosis 05/02/2020   Hypertension    High cholesterol    Depression, recurrent ([place])    Memory loss 08/02/2019   Vitamin B12 deficiency 08/02/2019    Conditions to be addressed/monitored:HTN, HLD, and Dementia  Care Plan :  RNCM:Dementia (Adult)  Updates made by Dimitri Ped, RN since 02/18/2021 12:00 AM     Problem: Potential of Harm or Injury related to dementia   Priority: High     Long-Range Goal: Harm or Injury Prevented related to dementia   Start Date: 02/18/2021  Expected End Date: 07/28/2021  This Visit's Progress: On track  Priority: High  Note:   Current Barriers:  Ineffective Self Health Maintenance in a patient with Dementia Unable to independently self manage dementia Unable to self administer medications as prescribed Unable to perform IADLs independently Spoke with Sebastian friend and brother Frederico Hamman Ridings(lives in [place]).  States that pt has become more forgetful and not taking care of herself over the last year or so.  [name] states that she, Letta Median Henry(DPOA) and another friend from church Ellie help pt with keeping up with appointments, medications and paying bills.  States that they have just hired a aide to come for 3 hours a week to help pt as needed.  States pt no longer drives and has had her  drivers licence taken away.  States she walks to store that is near by to shop sometimes.  States she is estranged from one of her daughters and her other daughter lives in [place].  States that they are concerned that pt will not be able to safely stay alone and will need to move to assisted living.  States that Ellie fills pts medication boxes for the week and that pt ofter does not take her bedtime pill.   Clinical Goal(s):  Collaboration with Isaac Bliss, Rayford Halsted, MD regarding development and update of comprehensive plan of care as evidenced by provider attestation and co-signature Inter-disciplinary care team collaboration (see longitudinal plan of care) patient will work with care management team to address care coordination and chronic disease management needs related to Disease Management Educational Needs Care Coordination Medication Management and  Education Psychosocial Support Caregiver Stress support Dementia and Caregiver Support Level of Care Concerns   Interventions:  Evaluation of current treatment plan related to HTN, HLD, and Dementia, Level of care concerns, ADL IADL limitations, Family and relationship dysfunction, Cognitive Deficits, Memory Deficits, Inability to perform IADL's independently, and Lacks knowledge of community resource: dementia  self-management and patient's adherence to plan as established by provider. Collaboration with Isaac Bliss, Rayford Halsted, MD regarding development and update of comprehensive plan of care as evidenced by provider attestation       and co-signature Inter-disciplinary care team collaboration (see longitudinal plan of care) Discussed plans with patient for ongoing care management follow up and provided patient with direct contact information for care management team Referred to CCM social works for level of care issues, caregiver stress and resources Referred to CCM pharmacist for medication adherence issues, possible simplification of medication regimen, and pill packaging  Reviewed home safety issues with caregivers   Self Care Activities:  Self administers medications as prescribed Attends all scheduled provider appointments Calls pharmacy for medication refills Attends church or other social activities Calls provider office for new concerns or questions Patient Goals: - attend dementia support group - check out dementia website - check out other places when staying at home is no  longer possible (assisted living center, nursing home) - check out services like in-home help or adult day care - check with a financial planner - connect with the local or national dementia organization - hold a family meeting with family members and loved ones - list the symptoms that would make staying at home too hard - make a list of future care and financial needs - make a list of people who can  help and what they can do Follow Up Plan: Telephone follow up appointment with care management team member scheduled for: 03/22/21 The patient has been provided with contact information for the care management team and has been advised to call with any health related questions or concerns.      Care Plan : RNCM:Cardiovascular disease (HTN and HLD)  Updates made by Dimitri Ped, RN since 02/18/2021 12:00 AM     Problem: Lack of long term Cardiovascular disease Management (HTN and HLD)   Priority: Medium     Long-Range Goal: Effective Cardiovascular disease Self Management (HTN and HLD)   Start Date: 02/18/2021  Expected End Date: 07/28/2021  This Visit's Progress: On track  Priority: Medium  Note:   Current Barriers:  Knowledge Deficits related to basic understanding of Cardiovascular disease (HTN and HLD) pathophysiology and self care management Unable to independently Self manage Cardiovascular disease (HTN and HLD) Unable to self administer medications as prescribed Unable to perform IADLs independently Caregiver states that pt does not check B/P at home and does not have a monitor.  Caregiver(HCPOA )states she has monitor and will check B/P Nurse Case Manager Clinical Goal(s):  patient will verbalize understanding of plan for hypertension management patient will not experience hospital admission. Hospital Admissions in last 6 months = 0 patient will attend all scheduled medical appointments: no upcoming primary care provider patient will demonstrate improved adherence to prescribed treatment plan for hypertension as evidenced by taking all medications as prescribed, monitoring and recording blood pressure as directed, adhering to low sodium/DASH diet patient will demonstrate improved health management independence as evidenced by checking blood pressure as directed and notifying PCP if SBP>160 or DBP > 90, taking all medications as prescribe, and adhering to a low sodium diet as  discussed. patient will verbalize basic understanding of hypertension disease process and self health management plan as evidenced by readings within limits and adherence to medications  Interventions:  Collaboration with Isaac Bliss, Rayford Halsted, MD regarding development and update of comprehensive plan of care as evidenced by provider attestation and co-signature Inter-disciplinary care team collaboration (see longitudinal plan of care) Evaluation of current treatment plan related to hypertension self management and patient's adherence to plan as established by provider. Provided education to patient re: stroke prevention, s/s of heart attack and stroke, DASH diet, complications of uncontrolled blood pressure Reviewed medications with patient and discussed importance of compliance Discussed plans with patient for ongoing care management follow up and provided patient with direct contact information for care management team Advised patient, providing education and rationale, to monitor blood pressure daily and record, calling PCP for findings outside established parameters.  Reviewed scheduled/upcoming provider appointments including: no upcoming primary care provider Reviewed to contact pts insurance company to order B/P monitor from over the counter catalog  Referred to CCM social works for level of care issues, caregiver stress and resources Referred to CCM pharmacist for medication adherence issues, possible simplification of medication regimen, and pill packaging  Self-Care Activities:  Self administers medications as prescribed Attends all scheduled provider  appointments Calls pharmacy for medication refills Attends church or other social activities Calls provider office for new concerns or questions Patient Goals  - Self administer medications as prescribed  - Attend all scheduled provider appointments  - Call provider office for new concerns, questions, or BP outside discussed  parameters  - Check BP and record as discussed  - Follows a low sodium diet/DASH diet - check blood pressure weekly - choose a place to take my blood pressure (home, clinic or office, retail store) - write blood pressure results in a log or diary - ask questions to understand Follow Up Plan: Telephone follow up appointment with care management team member scheduled for: 03/22/21 The patient has been provided with contact information for the care management team and has been advised to call with any health related questions or concerns.       Plan:Telephone follow up appointment with care management team member scheduled for:  03/22/21 and The patient has been provided with contact information for the care management team and has been advised to call with any health related questions or concerns.  Peter Garter RN, Jackquline Denmark, CDE Care Management Coordinator [facility] [phone], Mobile [phone]

## 2021-02-20 ENCOUNTER — Other Ambulatory Visit: Payer: Self-pay

## 2021-02-20 ENCOUNTER — Ambulatory Visit (HOSPITAL_COMMUNITY)
Admission: EM | Admit: 2021-02-20 | Discharge: 2021-02-20 | Disposition: A | Discharge status: Home / Self Care | Payer: Medicare PPO | Attending: Urgent Care | Admitting: Urgent Care

## 2021-02-20 ENCOUNTER — Encounter (HOSPITAL_COMMUNITY): Payer: Self-pay

## 2021-02-20 DIAGNOSIS — G319 Degenerative disease of nervous system, unspecified: Secondary | ICD-10-CM | POA: Diagnosis not present

## 2021-02-20 DIAGNOSIS — F339 Major depressive disorder, recurrent, unspecified: Secondary | ICD-10-CM | POA: Diagnosis not present

## 2021-02-20 DIAGNOSIS — R413 Other amnesia: Secondary | ICD-10-CM | POA: Insufficient documentation

## 2021-02-20 DIAGNOSIS — E860 Dehydration: Secondary | ICD-10-CM

## 2021-02-20 LAB — POCT URINALYSIS DIPSTICK, ED / UC
Glucose, UA: NEGATIVE mg/dL
Ketones, ur: 15 mg/dL — AB
Nitrite: NEGATIVE
Protein, ur: 100 mg/dL — AB
Specific Gravity, Urine: >=1.03 (ref 1.005–1.030)
Urobilinogen, UA: 2 mg/dL — ABNORMAL HIGH (ref 0.0–1.0)
pH: 6 (ref 5.0–8.0)

## 2021-02-20 LAB — CBC
HCT: 45.8 % (ref 36.0–46.0)
Hemoglobin: 15.3 g/dL — ABNORMAL HIGH (ref 12.0–15.0)
MCH: 29.9 pg (ref 26.0–34.0)
MCHC: 33.4 g/dL (ref 30.0–36.0)
MCV: 89.6 fL (ref 80.0–100.0)
Platelets: 261 10*3/uL (ref 150–400)
RBC: 5.11 MIL/uL (ref 3.87–5.11)
RDW: 12.8 % (ref 11.5–15.5)
WBC: 5.4 10*3/uL (ref 4.0–10.5)
nRBC: 0 % (ref 0.0–0.2)

## 2021-02-20 LAB — BASIC METABOLIC PANEL
Anion gap: 9 (ref 5–15)
BUN: 17 mg/dL (ref 8–23)
CO2: 30 mmol/L (ref 22–32)
Calcium: 9.3 mg/dL (ref 8.9–10.3)
Chloride: 95 mmol/L — ABNORMAL LOW (ref 98–111)
Creatinine, Ser: 1.34 mg/dL — ABNORMAL HIGH (ref 0.44–1.00)
GFR, Estimated: 42 mL/min — ABNORMAL LOW (ref >60)
Glucose, Bld: 107 mg/dL — ABNORMAL HIGH (ref 70–99)
Potassium: 3.7 mmol/L (ref 3.5–5.1)
Sodium: 134 mmol/L — ABNORMAL LOW (ref 135–145)

## 2021-02-20 NOTE — ED Triage Notes (Signed)
Pt presents with new string of depression and weight loss X 2 weeks; pt states she has episodes of crying, sadness, and loss of interest in daily things suddenly.

## 2021-02-20 NOTE — ED Provider Notes (Signed)
Madison Glover - URGENT CARE CENTER   MRN: [id number] DOB: 1951/04/14  Subjective:   Madison Glover is a 70 y.o. female presenting for 2-week history of acute onset worsening fatigue, lethargy, decreased appetite, sadness, depression.  Patient presents with her financial and medical power of attorney who helps patient make her appointments and manage her health.  The patient herself lives in her own home.  The primary issue stress per her POA or relatives, her children have been stressing her out for the past few weeks.  The patient also endorses that 2 years ago she had a bad falling out with her daughter and as she intermittently comes back into her life really upsets her and her depression.  She also has a history of memory loss, neurodegenerative cognitive impairment.  She is on medications for this and is managed by [facility].  Last office visit was 11/06/2020, was to continue Namenda and Aricept.  Follow-up was to be scheduled for 1 year.  She also has a PCP with [facility] primary [facility].  Last office visit with Dr. Ardyth Harps was 02/01/2021.  No lab work was done at the time.  Patient has been on Celexa 20 mg for a while now.  She was previously managed by a psychiatrist but multiple providers from that same clinic have now retired.  Currently they have no referral that is pending.  Patient does not endorse any thoughts of self-harm, thoughts of hurting others.  Denies any previous history of self-harm.  However, her POA does report that she is talked a lot about going to have been.  Patient reports that she is a [name] and that is part of her belief system but is not considering ending her life in any kind of way.  No current facility-administered medications for this encounter.  Current Outpatient Medications:    alendronate (FOSAMAX) 70 MG tablet, TAKE 1 TABLET BY MOUTH EVERY 7 DAYS WITH A FULL GLASS OF WATER ON AN EMPTY STOMACH., Disp: 12 tablet, Rfl: 1   citalopram  (CELEXA) 20 MG tablet, Take 1 tablet (20 mg total) by mouth daily., Disp: 90 tablet, Rfl: 1   donepezil (ARICEPT) 10 MG tablet, Take 1 tablet (10 mg total) by mouth at bedtime., Disp: 90 tablet, Rfl: 3   hydrochlorothiazide (HYDRODIURIL) 25 MG tablet, TAKE 1 TABLET BY MOUTH EVERY DAY, Disp: 90 tablet, Rfl: 1   memantine (NAMENDA) 10 MG tablet, Take 1 tablet (10 mg total) by mouth 2 (two) times daily., Disp: 180 tablet, Rfl: 3   metoprolol succinate (TOPROL-XL) 100 MG 24 hr tablet, TAKE 1 TABLET BY MOUTH EVERY DAY, Disp: 90 tablet, Rfl: 1   Metoprolol Succinate 100 MG CS24, Take 100 mg by mouth daily., Disp: 90 capsule, Rfl: 1   mupirocin ointment (BACTROBAN) 2 %, Place 1 application into the nose 2 (two) times daily., Disp: 22 g, Rfl: 0   simvastatin (ZOCOR) 20 MG tablet, TAKE 1 TABLET (20 MG TOTAL) BY MOUTH DAILY AT 6 PM., Disp: 90 tablet, Rfl: 1   vitamin B-12 (CYANOCOBALAMIN) 1000 MCG tablet, Take 1,000 mcg by mouth daily., Disp: , Rfl:    Allergies  Allergen Reactions   Bee Venom Swelling    Past Medical History:  Diagnosis Date   Allergy    Anxiety    High cholesterol    Hypertension    Memory loss    Osteoporosis    Vitamin D deficiency      Past Surgical History:  Procedure Laterality Date  COLONOSCOPY WITH PROPOFOL N/A 10/19/2020   Procedure: COLONOSCOPY WITH PROPOFOL;  Surgeon: Rachael Fee, MD;  Location: [facility];  Service: Endoscopy;  Laterality: N/A;   LAPAROSCOPY     POLYPECTOMY  10/19/2020   Procedure: POLYPECTOMY;  Surgeon: Rachael Fee, MD;  Location: [facility];  Service: Endoscopy;;   TONSILLECTOMY      Family History  Problem Relation Age of Onset   Breast cancer Mother    COPD Father    Heart disease Father    Hypertension Father    Cancer Brother        type unknown   Colon cancer Neg Hx    Esophageal cancer Neg Hx    Stomach cancer Neg Hx    Rectal cancer Neg Hx     Social History   Tobacco Use   Smoking status: Former     Years: 4.00    Types: Cigarettes   Smokeless tobacco: Never   Tobacco comments:    on occasion at parties  Vaping Use   Vaping Use: Never used  Substance Use Topics   Alcohol use: Yes    Comment: occasional wine   Drug use: Never    ROS   Objective:   Vitals: BP (!) 159/93 (BP Location: Right Arm)   Pulse 76   Temp 98.4 F (36.9 C) (Oral)   Resp 17   SpO2 97%   Physical Exam Constitutional:      General: She is not in acute distress.    Appearance: Normal appearance. She is well-developed. She is not ill-appearing, toxic-appearing or diaphoretic.  HENT:     Head: Normocephalic and atraumatic.     Nose: Nose normal.     Mouth/Throat:     Mouth: Mucous membranes are moist.     Pharynx: Oropharynx is clear.  Eyes:     General: No scleral icterus.       Right eye: No discharge.        Left eye: No discharge.     Extraocular Movements: Extraocular movements intact.     Conjunctiva/sclera: Conjunctivae normal.     Pupils: Pupils are equal, round, and reactive to light.  Cardiovascular:     Rate and Rhythm: Normal rate and regular rhythm.     Pulses: Normal pulses.     Heart sounds: Normal heart sounds. No murmur heard.   No friction rub. No gallop.  Pulmonary:     Effort: Pulmonary effort is normal. No respiratory distress.     Breath sounds: Normal breath sounds. No stridor. No wheezing, rhonchi or rales.  Skin:    General: Skin is warm and dry.     Findings: No rash.  Neurological:     General: No focal deficit present.     Mental Status: She is alert and oriented to person, place, and time.  Psychiatric:        Mood and Affect: Mood is anxious and depressed. Mood is not elated. Affect is flat. Affect is not labile, blunt, angry, tearful or inappropriate.        Speech: She is communicative. Speech is rapid and pressured. Speech is not delayed, slurred or tangential.        Behavior: Behavior normal. Behavior is not agitated, slowed, aggressive, withdrawn,  hyperactive or combative. Behavior is cooperative.        Thought Content: Thought content normal. Thought content does not include homicidal or suicidal ideation. Thought content does not include homicidal or suicidal plan.  Results for orders placed or performed during the hospital encounter of 02/20/21 (from the past 24 hour(s))  POC Urinalysis dipstick     Status: Abnormal   Collection Time: 02/20/21  3:32 PM  Result Value Ref Range   Glucose, UA NEGATIVE NEGATIVE mg/dL   Bilirubin Urine LARGE (A) NEGATIVE   Ketones, ur 15 (A) NEGATIVE mg/dL   Specific Gravity, Urine >=1.030 1.005 - 1.030   Hgb urine dipstick TRACE (A) NEGATIVE   pH 6.0 5.0 - 8.0   Protein, ur 100 (A) NEGATIVE mg/dL   Urobilinogen, UA 2.0 (H) 0.0 - 1.0 mg/dL   Nitrite NEGATIVE NEGATIVE   Leukocytes,Ua TRACE (A) NEGATIVE     Assessment and Plan :   PDMP not reviewed this encounter.  1. Episode of recurrent major depressive disorder, unspecified depression episode severity (HCC)   2. Neurodegenerative cognitive impairment (HCC)   3. Memory loss     Case discussed with Dr. Leonides Grills. Patient is not currently a harm to herself or others. As such will defer ER visit to [facility] for her behavioral health. It would be inappropriate for me to make changes to her medical regimen for her depression. Emphasized need to follow up urgently with her PCP and also a mental health provider (information provided to her for this). Labs are pending, no sign of acute cystitis. Dehydration was expected given her decreased appetite and lack of self care recently. Counseled patient on potential for adverse effects with medications prescribed/recommended today, ER and return-to-clinic precautions discussed, patient verbalized understanding.    Wallis Bamberg, [place] 02/20/21 1538

## 2021-02-20 NOTE — ED Notes (Signed)
Pt given two cups of iced water and a hat for assistance to urinate.

## 2021-02-21 ENCOUNTER — Encounter (HOSPITAL_COMMUNITY): Payer: Self-pay | Admitting: Emergency Medicine

## 2021-02-21 ENCOUNTER — Other Ambulatory Visit: Payer: Self-pay

## 2021-02-21 ENCOUNTER — Ambulatory Visit (INDEPENDENT_AMBULATORY_CARE_PROVIDER_SITE_OTHER)
Admission: EM | Admit: 2021-02-21 | Discharge: 2021-02-21 | Disposition: A | Discharge status: Other Acute Inpatient Hospital | Payer: Medicare PPO | Source: Home / Self Care

## 2021-02-21 ENCOUNTER — Emergency Department (HOSPITAL_COMMUNITY)
Admission: EM | Admit: 2021-02-21 | Discharge: 2021-02-23 | Disposition: A | Discharge status: Other Acute Inpatient Hospital | Payer: Medicare PPO | Attending: Emergency Medicine | Admitting: Emergency Medicine

## 2021-02-21 DIAGNOSIS — Z79899 Other long term (current) drug therapy: Secondary | ICD-10-CM | POA: Insufficient documentation

## 2021-02-21 DIAGNOSIS — F341 Dysthymic disorder: Secondary | ICD-10-CM

## 2021-02-21 DIAGNOSIS — F331 Major depressive disorder, recurrent, moderate: Secondary | ICD-10-CM | POA: Insufficient documentation

## 2021-02-21 DIAGNOSIS — I10 Essential (primary) hypertension: Secondary | ICD-10-CM | POA: Diagnosis not present

## 2021-02-21 DIAGNOSIS — R45851 Suicidal ideations: Secondary | ICD-10-CM

## 2021-02-21 DIAGNOSIS — R918 Other nonspecific abnormal finding of lung field: Secondary | ICD-10-CM | POA: Diagnosis not present

## 2021-02-21 DIAGNOSIS — Z20822 Contact with and (suspected) exposure to covid-19: Secondary | ICD-10-CM | POA: Insufficient documentation

## 2021-02-21 DIAGNOSIS — F32A Depression, unspecified: Secondary | ICD-10-CM | POA: Diagnosis not present

## 2021-02-21 DIAGNOSIS — Y900 Blood alcohol level of less than 20 mg/100 ml: Secondary | ICD-10-CM | POA: Insufficient documentation

## 2021-02-21 DIAGNOSIS — Z87891 Personal history of nicotine dependence: Secondary | ICD-10-CM | POA: Diagnosis not present

## 2021-02-21 DIAGNOSIS — J984 Other disorders of lung: Secondary | ICD-10-CM | POA: Diagnosis not present

## 2021-02-21 DIAGNOSIS — R69 Illness, unspecified: Secondary | ICD-10-CM | POA: Insufficient documentation

## 2021-02-21 DIAGNOSIS — F321 Major depressive disorder, single episode, moderate: Secondary | ICD-10-CM

## 2021-02-21 NOTE — Progress Notes (Addendum)
Pt accepted to [facility]  Patient meets inpatient criteria per  Lamar Sprinkles, MD  Dr. Kevan Ny is the attending provider.    Call report to [phone]  Rodney Langton, LPN @ [facility] notified.     Pt scheduled  to arrive at [facility] at 0800 AM  Signed:  Corky Crafts, MSW, [place], LCASA 02/21/2021 7:55 PM

## 2021-02-21 NOTE — Progress Notes (Signed)
Patient referred out due to geriatric inpatient needs; patient is 70 y/o. Patient meets inpatient criteria per Lamar Sprinkles, MD. Patient referred to the following facilities:  Destination  Service Provider Address Phone Fax  [facility]  [address] [place] [address] [phone]  [facility] [address]  [address] [place] [address] [phone]  [facility]  [phone] [place] [address] [phone]  [facility]  [address] [place] [address] [phone]  [facility]  [address] [place] [address] [phone]  [facility]  [address] [place] [address] [phone]  [facility]  [address], [place] [address] [phone]  [facility]  [address], [place] [address] [phone]  [facility]  [address] [place] [address] [phone]  [facility]  [address] [place], [place] [address] [phone]  [facility]  [address] [place] [address] [phone]  [facility]  [address] [place] [address] [phone]  [facility]  [address], [place] [address] [phone]  [facility]  [address] [place] [address] [phone]    CSW will continue to monitor disposition.   Signed:  Corky Crafts, MSW, [employer], LCASA 02/21/2021 7:23 PM
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
## 2021-02-21 NOTE — ED Notes (Signed)
Contacted [employer] Dispatch, for serving IVC and transport of pt to [facility] ED.  Dispatch states [employer] to serve papers.

## 2021-02-21 NOTE — ED Triage Notes (Signed)
Patient here with passive suicidal ideation.  Patient is from [facility].  Patient having increased depression and not taking care of herself.  Medical POA is concerned with her welfare.

## 2021-02-21 NOTE — Progress Notes (Signed)
CSW received call from [place] who expressed interest in patient. Facility requested further documentation, which CSW provided. CSW to follow up with facility after reviewed by attending physician.   Signed:  Corky Crafts, MSW, [place], LCASA 02/21/2021 7:26 PM

## 2021-02-21 NOTE — BH Assessment (Signed)
Comprehensive Clinical Assessment (CCA) Note  02/21/2021 Madison Glover [id number]  Chief Complaint:  Chief Complaint  Patient presents with   Urgent Emergent Eval   Visit Diagnosis: MDD, recurrent, moderate Suicidal ideation  Disposition: Per Lamar Sprinklesourtney Cosby, MD pt meets inpatient criteria. Staff informed of disposition and CSW reports that [facility]. [facility] until transported.   Flowsheet Row ED [facility]) [facility]  C-SSRS RISK CATEGORY No Risk No Risk No Risk       The patient demonstrates the following risk factors for suicide: Chronic risk factors for suicide include: psychiatric disorder of depression . Acute risk factors for suicide include: family or marital conflict and social withdrawal/isolation. Protective factors for this patient include: positive therapeutic relationship, hope for the future, and religious beliefs against suicide. Considering these factors, the overall suicide risk at this point appears to be low. Patient is not appropriate for outpatient follow up.  Mad[facility] Glover is a 70 yo [facility] depression and suicidal thoughts. Pt currently is under IVC. Pt reports that thoughts started today triggered by family-related stress. Pt reported earlier suicidal ideation but states that her religious loyalty would keep her from following through with anything. Pt denies any current plans. Pt denies any HI or AVH. Pt reports that she currently is [facility]. Pt is accompanied by her POA, who helps pt remember details about life events. Pt denies any history of substance use. Pt reports that recently she is  feeling more emotional, is having issues with sleep, feeling more irritable, and has lost weight. Pt reports that she feels hopeless at times but knows "that's just the depression talking". Pt reports that her appetite has decreased recently, which is concerning to pt. Pts POA reports that family members recently took pts ex mother-in-law's furniture out of storage and placed the items in pts home. "they filled up my loft". Pts POA feels that pt is a danger to herself at time of assessment  CCA Screening, Triage and Referral (STR)  Patient Reported Information How did you hear about us? Self  What Is the Reason for Your Visit/Call Today? Madison Glover is a 70 yo female [facility] of depression and suicidal thoughts. Pt currently is under IVC. Pt reports that thoughts started today triggered by family-related stress. Pt reported earlier suicidal ideation but states that her religious loyalty would keep her from following through with anything. Pt denies any current plans. Pt denies any HI or AVH. Pt reports that she currently is [facility]. Pt is accompanied by her POA, who helps pt remember details about life events. Pt denies any history of substance use. Pt reports that recently she is feeling more emotional, is having issues with sleep, feeling more irritable, and has lost weight. Pt reports that she feels hopeless at times but knows "that's just the depression talking". Pt reports that her appetite has decreased recently, which is concerning to pt. Pts POA reports that family members recently took pts ex mother-in-law's furniture out of storage and placed the items in pts home. "they filled up my loft". Pts POA feels that pt is a danger to herself at time of assessment.  How  Long Has This Been Causing You Problems? 1 wk - 1 month  What Do You Feel Would Help You the Most Today? Treatment for Depression or other mood problem   Have You  Recently Had Any Thoughts About Hurting Yourself? Yes  Are You Planning to Commit Suicide/Harm Yourself At This time? No   Have you Recently Had Thoughts About Hurting Someone Karolee Ohs? No  Are You Planning to Harm Someone at This Time? No  Explanation: No data recorded  Have You Used Any Alcohol or Drugs in the Past 24 Hours? No  How Long Ago Did You Use Drugs or Alcohol? No data recorded What Did You Use and How Much? No data recorded  Do You Currently Have a Therapist/Psychiatrist? Yes  [facility]   Have You Been Recently Discharged From Any Office Practice or Programs? No  Explanation of Discharge From Practice/Program: No data recorded    CCA Screening Triage Referral Assessment Type of Contact: Tele-Assessment  Telemedicine Service Delivery: Telemedicine service delivery: This service was provided via telemedicine using a 2-way, interactive audio and video technology  Is this Initial or Reassessment? Initial Assessment  Date Telepsych consult ordered in [facility]:  02/21/21  Time Telepsych consult ordered in [facility]:  1925  Location of Assessment: [facility]  Provider Location: [facility] Assessment Services   Collateral Involvement: Pts POA was present throughout assessment   Does Patient Have a Automotive engineer Guardian? No data recorded Name and Contact of Legal Guardian: No data recorded If [place] and Not Living with Parent(s), Who has Custody? No data recorded Is CPS involved or ever been involved? Never  Is APS involved or ever been involved? Never   Patient Determined To Be At Risk for Harm To Self or Others Based on Review of Patient Reported Information or Presenting Complaint? Yes, for Self-Harm  Method: No data recorded Availability of Means: No data recorded Intent: No data recorded Notification Required: No data recorded Additional Information for Danger to Others Potential: No data  recorded Additional Comments for Danger to Others Potential: No data recorded Are There Guns or Other Weapons in Your Home? No data recorded Types of Guns/Weapons: No data recorded Are These Weapons Safely Secured?                            No data recorded Who Could Verify You Are Able To Have These Secured: No data recorded Do You Have any Outstanding Charges, Pending Court Dates, Parole/Probation? No data recorded Contacted To Inform of Risk of Harm To Self or Others: No data recorded   Does Patient Present under Involuntary Commitment? Yes  IVC Papers Initial File Date: 02/21/21   [place] of Residence: [place]   Patient Currently Receiving the Following Services: Medication Management; Individual Therapy   Determination of Need: Emergent (2 hours)   Options For Referral: Inpatient Hospitalization; Geropsychiatric Facility     CCA Biopsychosocial Patient Reported Schizophrenia/Schizoaffective Diagnosis in Past: No   Strengths: No data recorded  Mental Health Symptoms Depression:   Change in energy/activity; Difficulty Concentrating; Fatigue; Hopelessness; Increase/decrease in appetite; Irritability; Sleep (too much or little); Tearfulness; Weight gain/loss   Duration of Depressive symptoms:  Duration of Depressive Symptoms: Greater than two weeks   Mania:   Racing thoughts   Anxiety:    Worrying; Tension; Sleep; Restlessness; Irritability; Fatigue; Difficulty concentrating   Psychosis:   None   Duration of Psychotic symptoms:  Trauma:   None   Obsessions:   None   Compulsions:   None   Inattention:   None   Hyperactivity/Impulsivity:   None   Oppositional/Defiant Behaviors:   None   Emotional Irregularity:   None   Other Mood/Personality Symptoms:  No data recorded   Mental Status Exam Appearance and self-care  Stature:   Average   Weight:   Average weight   Clothing:   Neat/clean   Grooming:   Normal   Cosmetic use:    None   Posture/gait:   Normal   Motor activity:   Not Remarkable   Sensorium  Attention:   Distractible   Concentration:   Normal   Orientation:   X5   Recall/memory:   Normal   Affect and Mood  Affect:   Anxious; Depressed   Mood:   Anxious; Depressed   Relating  Eye contact:   Normal   Facial expression:   Depressed; Anxious   Attitude toward examiner:   Argumentative; Defensive; Irritable; Resistant; Cooperative (cooperative at times)   Thought and Language  Speech flow:  Flight of Ideas   Thought content:   Suspicious   Preoccupation:   Ruminations (ruminations about how bad her daughter is)   Hallucinations:   None   Organization:  No data recorded  [employer] of Knowledge:   Fair   Intelligence:   Average   Abstraction:   Functional   Judgement:   Fair   Dance movement psychotherapist:   Variable   Insight:   Gaps   Decision Making:   Impulsive   Social Functioning  Social Maturity:   Impulsive   Social Judgement:   Impropriety; Heedless   Stress  Stressors:   Family conflict   Coping Ability:   Normal   Skill Deficits:   None   Supports:   Church     Religion: Religion/Spirituality Are You A Religious Person?: Yes  Leisure/Recreation: Leisure / Recreation Do You Have Hobbies?: Yes  Exercise/Diet: Exercise/Diet Do You Exercise?: Yes What Type of Exercise Do You Do?: Run/Walk Have You Gained or Lost A Significant Amount of Weight in the Past Six Months?: Yes-Lost Number of Pounds Lost?: 10 Do You Follow a Special Diet?: No Do You Have Any Trouble Sleeping?: Yes Explanation of Sleeping Difficulties: insomnia   CCA Employment/Education Employment/Work Situation: Employment / Work Systems developer: Retired  Programme researcher, broadcasting/film/video: Education Is Patient Currently Attending School?: No   CCA Family/Childhood History Family and Relationship History: Family history Does patient have  children?: Yes How many children?: 2 How is patient's relationship with their children?: stable relationship with daughters  Childhood History:  Childhood History By whom was/is the patient raised?: Both parents (I had a wonderful Christian upbringing) Did patient suffer any verbal/emotional/physical/sexual abuse as a child?: No Did patient suffer from severe childhood neglect?: No Has patient ever been sexually abused/assaulted/raped as an adolescent or adult?: No Was the patient ever a victim of a crime or a disaster?: No Witnessed domestic violence?: No Has patient been affected by domestic violence as an adult?: No  Child/Adolescent Assessment:     CCA Substance Use Alcohol/Drug Use: Alcohol / Drug Use Pain Medications: see MAR Prescriptions: see MAR Over the Counter: see MAR History of alcohol / drug use?: No history of alcohol / drug abuse                         ASAM's:  Six Dimensions of  Multidimensional Assessment  Dimension 1:  Acute Intoxication and/or Withdrawal Potential:      Dimension 2:  Biomedical Conditions and Complications:      Dimension 3:  Emotional, Behavioral, or Cognitive Conditions and Complications:     Dimension 4:  Readiness to Change:     Dimension 5:  Relapse, Continued use, or Continued Problem Potential:     Dimension 6:  Recovery/Living Environment:     ASAM Severity Score:    ASAM Recommended Level of Treatment:     Substance use Disorder (SUD)    Recommendations for Services/Supports/Treatments: Recommendations for Services/Supports/Treatments Recommendations For Services/Supports/Treatments: Inpatient Hospitalization  Discharge Disposition:    DSM5 Diagnoses: Patient Active Problem List   Diagnosis Date Noted   Neurodegenerative cognitive impairment (HCC) 11/06/2020   Positive colorectal cancer screening using Cologuard test    Encounter for screening colonoscopy 10/17/2020   Vitamin D deficiency 07/31/2020    Osteoporosis 05/02/2020   Hypertension    High cholesterol    Depression, recurrent (HCC)    Memory loss 08/02/2019   Vitamin B12 deficiency 08/02/2019     Referrals to Alternative Service(s): Referred to Alternative Service(s):   Place:   Date:   Time:    Referred to Alternative Service(s):   Place:   Date:   Time:    Referred to Alternative Service(s):   Place:   Date:   Time:    Referred to Alternative Service(s):   Place:   Date:   Time:     Ernest Haber Velina Drollinger, LCSW

## 2021-02-21 NOTE — ED Notes (Signed)
Amber from [facility] dispatch said her LT is working on pt transfer to call back in 15 mins and ask for her

## 2021-02-21 NOTE — Discharge Instructions (Addendum)
Discharge to [facility] to await geri-psych placement pending medical clearance.

## 2021-02-21 NOTE — ED Notes (Signed)
[employer] dispatch called to serve IVC papers and transport pt to [facility] ED.

## 2021-02-22 ENCOUNTER — Emergency Department (HOSPITAL_COMMUNITY): Payer: Medicare PPO

## 2021-02-22 DIAGNOSIS — R918 Other nonspecific abnormal finding of lung field: Secondary | ICD-10-CM | POA: Diagnosis not present

## 2021-02-22 DIAGNOSIS — J984 Other disorders of lung: Secondary | ICD-10-CM | POA: Diagnosis not present

## 2021-02-22 DIAGNOSIS — R45851 Suicidal ideations: Secondary | ICD-10-CM | POA: Diagnosis not present

## 2021-02-22 LAB — RESP PANEL BY RT-PCR (FLU A&B, COVID) ARPGX2
Influenza A by PCR: NEGATIVE
Influenza B by PCR: NEGATIVE
SARS Coronavirus 2 by RT PCR: NEGATIVE

## 2021-02-22 LAB — COMPREHENSIVE METABOLIC PANEL
ALT: 20 U/L (ref 0–44)
AST: 25 U/L (ref 15–41)
Albumin: 4.1 g/dL (ref 3.5–5.0)
Alkaline Phosphatase: 47 U/L (ref 38–126)
Anion gap: 15 (ref 5–15)
BUN: 23 mg/dL (ref 8–23)
CO2: 27 mmol/L (ref 22–32)
Calcium: 9.1 mg/dL (ref 8.9–10.3)
Chloride: 92 mmol/L — ABNORMAL LOW (ref 98–111)
Creatinine, Ser: 1.37 mg/dL — ABNORMAL HIGH (ref 0.44–1.00)
GFR, Estimated: 41 mL/min — ABNORMAL LOW (ref >60)
Glucose, Bld: 108 mg/dL — ABNORMAL HIGH (ref 70–99)
Potassium: 3.2 mmol/L — ABNORMAL LOW (ref 3.5–5.1)
Sodium: 134 mmol/L — ABNORMAL LOW (ref 135–145)
Total Bilirubin: 1.4 mg/dL — ABNORMAL HIGH (ref 0.3–1.2)
Total Protein: 7.2 g/dL (ref 6.5–8.1)

## 2021-02-22 LAB — ETHANOL: Alcohol, Ethyl (B): <10 mg/dL (ref <10)

## 2021-02-22 LAB — CBC
HCT: 45.7 % (ref 36.0–46.0)
Hemoglobin: 14.9 g/dL (ref 12.0–15.0)
MCH: 29.5 pg (ref 26.0–34.0)
MCHC: 32.6 g/dL (ref 30.0–36.0)
MCV: 90.5 fL (ref 80.0–100.0)
Platelets: 300 10*3/uL (ref 150–400)
RBC: 5.05 MIL/uL (ref 3.87–5.11)
RDW: 12.9 % (ref 11.5–15.5)
WBC: 8.6 10*3/uL (ref 4.0–10.5)
nRBC: 0 % (ref 0.0–0.2)

## 2021-02-22 LAB — RAPID URINE DRUG SCREEN, HOSP PERFORMED
Amphetamines: NOT DETECTED
Barbiturates: NOT DETECTED
Benzodiazepines: NOT DETECTED
Cocaine: NOT DETECTED
Opiates: NOT DETECTED
Tetrahydrocannabinol: NOT DETECTED

## 2021-02-22 LAB — ACETAMINOPHEN LEVEL: Acetaminophen (Tylenol), Serum: <10 ug/mL — ABNORMAL LOW (ref 10–30)

## 2021-02-22 LAB — SALICYLATE LEVEL: Salicylate Lvl: <7 mg/dL — ABNORMAL LOW (ref 7.0–30.0)

## 2021-02-22 MED ORDER — MEMANTINE HCL 10 MG PO TABS
10.0000 mg | ORAL_TABLET | Freq: Two times a day (BID) | ORAL | Status: DC
  Administered 2021-02-22 – 2021-02-23 (×3): 10 mg via ORAL
  Filled 2021-02-22 (×4): qty 1

## 2021-02-22 MED ORDER — CITALOPRAM HYDROBROMIDE 10 MG PO TABS
20.0000 mg | ORAL_TABLET | Freq: Every day | ORAL | Status: DC
  Administered 2021-02-22: 20 mg via ORAL
  Filled 2021-02-22: qty 2

## 2021-02-22 MED ORDER — HYDROCHLOROTHIAZIDE 25 MG PO TABS
25.0000 mg | ORAL_TABLET | Freq: Every day | ORAL | Status: DC
  Administered 2021-02-22: 25 mg via ORAL
  Filled 2021-02-22: qty 1

## 2021-02-22 MED ORDER — POTASSIUM CHLORIDE CRYS ER 20 MEQ PO TBCR
40.0000 meq | EXTENDED_RELEASE_TABLET | Freq: Once | ORAL | Status: AC
  Administered 2021-02-22: 40 meq via ORAL
  Filled 2021-02-22: qty 2

## 2021-02-22 MED ORDER — METOPROLOL SUCCINATE ER 25 MG PO TB24
100.0000 mg | ORAL_TABLET | Freq: Every day | ORAL | Status: DC
  Administered 2021-02-22: 100 mg via ORAL
  Filled 2021-02-22: qty 4

## 2021-02-22 MED ORDER — VITAMIN B-12 1000 MCG PO TABS
1000.0000 ug | ORAL_TABLET | Freq: Every day | ORAL | Status: DC
  Administered 2021-02-22: 1000 ug via ORAL
  Filled 2021-02-22: qty 1

## 2021-02-22 MED ORDER — DONEPEZIL HCL 5 MG PO TABS
10.0000 mg | ORAL_TABLET | Freq: Every day | ORAL | Status: DC
  Administered 2021-02-22 – 2021-02-23 (×2): 10 mg via ORAL
  Filled 2021-02-22: qty 1
  Filled 2021-02-22: qty 2

## 2021-02-22 MED ORDER — ALENDRONATE SODIUM 70 MG PO TABS: 70.0000 mg | ORAL_TABLET | ORAL | Status: DC

## 2021-02-22 MED ORDER — SIMVASTATIN 20 MG PO TABS
20.0000 mg | ORAL_TABLET | Freq: Every day | ORAL | Status: DC
  Administered 2021-02-22: 20 mg via ORAL
  Filled 2021-02-22: qty 1

## 2021-02-22 NOTE — BH Assessment (Signed)
Disposition:   Received a call from [place] at [facility], confirming that patient was discharged from the Emergency Department and no longer needed bed placement. States that she given conflicting information earlier and wanted to confirm.   Clinician looked up patient and found that she was at [facility]. Confirmed with the [facility] provider (Dr. Esmeralda Arthur), that she still needs [facility] psych bed placement. Morrie Sheldon confirmed that patient's bed was still available as long as patient is medically cleared.  She then called back and stated that her bed assigned was no longer available due to being told that patient was discharged. She recommended this Clinician to re-fax referral.   Clinician also confirmed that patient was medically cleared with Dr. Denton Lank for [facility] Psych placement.  Clinician called back to [facility] to discuss with Morrie Sheldon that patient continues to need bed placement, requested their facility to re-consider patient for that bed placement. Per Morrie Sheldon, patient was re-accepted.  See details below:   Patient accepted to [facility] Geriatric Acute Unit. [facility] provider, Kevan Ny, MD will accept patient. Call report to #[phone]. [place] would like patient to arrive before midnight, 02/22/2021. If she is unable to arrive by midnight due to transportation reasons, the facility will still accept her to the facility 02/23/2021 after 8am. Request nursing to please arrange transport. Also, to provide and update  #[phone] regarding her ETA.   EDP's (Dr. Denton Lank and Dr. Tanna Savoy), [facility] provider (Dr. Esmeralda Arthur, Liborio Nixon, NP, Berneice Heinrich, RN), patient's nurse (Zoe, RN) were all made aware of disposition updates.

## 2021-02-22 NOTE — ED Notes (Signed)
Report given to Letta Pate, RN [phone]

## 2021-02-22 NOTE — BH Assessment (Addendum)
Disposition:   Patient accepted to [facility] Geriatric Acute Unit. [facility] provider, Kevan Ny, MD will accept patient. Call report to #[phone]. [place] would like patient to arrive before midnight, 02/22/2021. If she is unable to arrive by midnight due to transportation reasons, the facility will still accept her to the facility 02/23/2021 after 8am. Request nursing to please arrange transport. Also, to provide and update  #[phone] regarding her ETA.  Received notice from patient's nurse Eulis Foster, RN), that Medical POA's/family are concerned about patient's disposition and transfer to [facility]. Family reported concerns of the facilities distance, requesting that she is referred to a closer location. Also, family reporting that patient's mental health symptoms are not significant enough warranting a need for inpatient treatment. Family would like patient to remain in the ED until a closer facility accepts her for treatment.  Clinician shared the families concerns with current [facility] provider Ysidro Evert, NP), whom reviewed patient's chart, continues to recommend inpatient [facility] treatment. Additionally, Clinician confirmed patient's recommendations for inpatient [facility]) treatment with psych provider, Dr.Courtney Esmeralda Arthur earlier today. Therefore, inpatient [facility] Inf[facility]) treatment remains to be patient's disposition.   Clinician also spoke to EDP/Dr. Tanna Savoy who is in agreement with sending patient to [facility], 02/23/2021 versus patient remaining in the Emergency Department waiting for bed placement as family is requesting.  Clinician listened to families concerns, specifically [name]/POA. Family with many questions regarding the inpatient admission to [facility]. Clinician made the Medical POA  aware that [place] would be contacted to obtain answers to her questions. Clinician contacted the facility to request answers for patient's POA. Clinician provided answers as  requested by the POA....estimated time of stay, how many pairs of clothing to bring, visiting hours, program structure, etc.  Although the POA was not happy about the disposition to still refer patient to [place], understood that patient will be transferred to [facility] 02/23/2021 as no other appropriate facilities provided a bed offer.   Morrie Sheldon with [facility] as requested a copy of patient's POA paperwork. Patient's nurse Eulis Foster) was agreeable to faxing a copy of forms to [phone].  POA's contact: Louie Bun [phone] and [name] [phone].

## 2021-02-22 NOTE — ED Notes (Signed)
Left message with [place] [employer] to arrange transport for pt to [facility].

## 2021-02-22 NOTE — ED Notes (Signed)
Sitter for pt leaving. No sitter available for pt for approx 30-40 minutes

## 2021-02-22 NOTE — ED Notes (Addendum)
Pt at nurses' desk to call her daughter. Pt speaking to her daughter. Repeating the same things over and over. Stating "I don't remember what happened." and "What happened? Was I depressed?"

## 2021-02-22 NOTE — ED Notes (Signed)
Breakfast order placed ?

## 2021-02-22 NOTE — ED Provider Notes (Signed)
[facility] EMERGENCY DEPARTMENT Provider Note   CSN: [id number] Arrival date & time: 02/21/21  2316     History Chief Complaint  Patient presents with   Suicidal    Madison Glover is a 70 y.o. female.  70 yo F with a chief complaints of depression.  Patient has seen behavioral health urgent care now twice in a week.  Couple days ago was felt to be safe for home and okay for outpatient follow-up.  Was seen today and was thought that she might be a danger to herself and was sent here to await [facility] psych placement.  Patient denies any overt suicidal ideation.  States that right that she would not do anything to kill herself.  Does admit to being depressed.  The history is provided by the patient.  Illness Severity:  Moderate Onset quality:  Gradual Duration:  2 months Timing:  Intermittent Progression:  Waxing and waning Chronicity:  Recurrent Associated symptoms: no chest pain, no congestion, no fever, no headaches, no myalgias, no nausea, no rhinorrhea, no shortness of breath, no vomiting and no wheezing       Past Medical History:  Diagnosis Date   Allergy    Anxiety    High cholesterol    Hypertension    Memory loss    Osteoporosis    Vitamin D deficiency     Patient Active Problem List   Diagnosis Date Noted   MDD (major depressive disorder), recurrent episode, moderate (HCC)    Suicidal ideation    Neurodegenerative cognitive impairment (HCC) 11/06/2020   Positive colorectal cancer screening using Cologuard test    Encounter for screening colonoscopy 10/17/2020   Vitamin D deficiency 07/31/2020   Osteoporosis 05/02/2020   Hypertension    High cholesterol    Depression, recurrent (HCC)    Memory loss 08/02/2019   Vitamin B12 deficiency 08/02/2019    Past Surgical History:  Procedure Laterality Date   COLONOSCOPY WITH PROPOFOL N/A 10/19/2020   Procedure: COLONOSCOPY WITH PROPOFOL;  Surgeon: Rachael Fee, MD;  Location: [facility]  ENDOSCOPY;  Service: Endoscopy;  Laterality: N/A;   LAPAROSCOPY     POLYPECTOMY  10/19/2020   Procedure: POLYPECTOMY;  Surgeon: Rachael Fee, MD;  Location: [facility] ENDOSCOPY;  Service: Endoscopy;;   TONSILLECTOMY       OB History     Gravida  0   Para  0   Term  0   Preterm  0   AB  0   Living  0      SAB  0   IAB  0   Ectopic  0   Multiple  0   Live Births  0           Family History  Problem Relation Age of Onset   Breast cancer Mother    COPD Father    Heart disease Father    Hypertension Father    Cancer Brother        type unknown   Colon cancer Neg Hx    Esophageal cancer Neg Hx    Stomach cancer Neg Hx    Rectal cancer Neg Hx     Social History   Tobacco Use   Smoking status: Former    Years: 4.00    Types: Cigarettes   Smokeless tobacco: Never   Tobacco comments:    on occasion at parties  Vaping Use   Vaping Use: Never used  Substance Use Topics   Alcohol use:  Yes    Comment: occasional wine   Drug use: Never    Home Medications Prior to Admission medications   Medication Sig Start Date End Date Taking? Authorizing Provider  alendronate (FOSAMAX) 70 MG tablet TAKE 1 TABLET BY MOUTH EVERY 7 DAYS WITH A FULL GLASS OF WATER ON AN EMPTY STOMACH. 01/15/21   Philip Aspen, Limmie Patricia, MD  citalopram (CELEXA) 20 MG tablet Take 1 tablet (20 mg total) by mouth daily. 04/17/20   Philip Aspen, Limmie Patricia, MD  donepezil (ARICEPT) 10 MG tablet Take 1 tablet (10 mg total) by mouth at bedtime. 11/06/20   Glean Salvo, NP  hydrochlorothiazide (HYDRODIURIL) 25 MG tablet TAKE 1 TABLET BY MOUTH EVERY DAY 02/04/21   Philip Aspen, Limmie Patricia, MD  memantine (NAMENDA) 10 MG tablet Take 1 tablet (10 mg total) by mouth 2 (two) times daily. 11/06/20   Glean Salvo, NP  metoprolol succinate (TOPROL-XL) 100 MG 24 hr tablet TAKE 1 TABLET BY MOUTH EVERY DAY 12/28/20   Philip Aspen, Limmie Patricia, MD  Metoprolol Succinate 100 MG CS24 Take 100 mg by mouth daily.  04/17/20   Philip Aspen, Limmie Patricia, MD  mupirocin ointment (BACTROBAN) 2 % Place 1 application into the nose 2 (two) times daily. 10/19/20   Alwyn Ren, MD  simvastatin (ZOCOR) 20 MG tablet TAKE 1 TABLET (20 MG TOTAL) BY MOUTH DAILY AT 6 PM. 10/23/20   Philip Aspen, Limmie Patricia, MD  vitamin B-12 (CYANOCOBALAMIN) 1000 MCG tablet Take 1,000 mcg by mouth daily.    [provider]    Allergies    Bee venom  Review of Systems   Review of Systems  Constitutional:  Negative for chills and fever.  HENT:  Negative for congestion and rhinorrhea.   Eyes:  Negative for redness and visual disturbance.  Respiratory:  Negative for shortness of breath and wheezing.   Cardiovascular:  Negative for chest pain and palpitations.  Gastrointestinal:  Negative for nausea and vomiting.  Genitourinary:  Negative for dysuria and urgency.  Musculoskeletal:  Negative for arthralgias and myalgias.  Skin:  Negative for pallor and wound.  Neurological:  Negative for dizziness and headaches.  Psychiatric/Behavioral:  Positive for dysphoric mood and suicidal ideas.    Physical Exam Updated Vital Signs BP (!) 178/92   Pulse 70   Temp 97.7 F (36.5 C) (Oral)   Resp 16   SpO2 100%   Physical Exam Vitals and nursing note reviewed.  Constitutional:      General: She is not in acute distress.    Appearance: She is well-developed. She is not diaphoretic.  HENT:     Head: Normocephalic and atraumatic.  Eyes:     Pupils: Pupils are equal, round, and reactive to light.  Cardiovascular:     Rate and Rhythm: Normal rate and regular rhythm.     Heart sounds: No murmur heard.   No friction rub. No gallop.  Pulmonary:     Effort: Pulmonary effort is normal.     Breath sounds: No wheezing or rales.  Abdominal:     General: There is no distension.     Palpations: Abdomen is soft.     Tenderness: There is no abdominal tenderness.  Musculoskeletal:        General: No tenderness.     Cervical  back: Normal range of motion and neck supple.  Skin:    General: Skin is warm and dry.  Neurological:     Mental Status: She is  alert and oriented to person, place, and time.  Psychiatric:        Mood and Affect: Mood is depressed.        Behavior: Behavior normal.        Thought Content: Thought content does not include homicidal or suicidal ideation. Thought content does not include homicidal or suicidal plan.    ED Results / Procedures / Treatments   Labs (all labs ordered are listed, but only abnormal results are displayed) Labs Reviewed  COMPREHENSIVE METABOLIC PANEL - Abnormal; Notable for the following components:      Result Value   Sodium 134 (*)    Potassium 3.2 (*)    Chloride 92 (*)    Glucose, Bld 108 (*)    Creatinine, Ser 1.37 (*)    Total Bilirubin 1.4 (*)    GFR, Estimated 41 (*)    All other components within normal limits  SALICYLATE LEVEL - Abnormal; Notable for the following components:   Salicylate Lvl <7.0 (*)    All other components within normal limits  ACETAMINOPHEN LEVEL - Abnormal; Notable for the following components:   Acetaminophen (Tylenol), Serum <10 (*)    All other components within normal limits  RESP PANEL BY RT-PCR (FLU A&B, COVID) ARPGX2  ETHANOL  CBC  RAPID URINE DRUG SCREEN, HOSP PERFORMED    EKG None  Radiology DG Chest Port 1 View  Result Date: 02/22/2021 CLINICAL DATA:  Screening.  Increased depression. EXAM: PORTABLE CHEST 1 VIEW COMPARISON:  None. FINDINGS: Heart is normal in size with normal mediastinal contours. There is biapical pleuroparenchymal scarring. Minimal hyperinflation. No acute or focal airspace disease. No pulmonary edema. No pleural effusion or pneumothorax. No acute osseous abnormalities are seen IMPRESSION: Minimal hyperinflation. Biapical pleuroparenchymal scarring. No acute chest finding. Electronically Signed   By: Narda RutherfordMelanie  Sanford M.D.   On: 02/22/2021 02:11    Procedures Procedures   Medications  Ordered in ED Medications  citalopram (CELEXA) tablet 20 mg (has no administration in time range)  donepezil (ARICEPT) tablet 10 mg (has no administration in time range)  hydrochlorothiazide (HYDRODIURIL) tablet 25 mg (has no administration in time range)  memantine (NAMENDA) tablet 10 mg (has no administration in time range)  metoprolol succinate (TOPROL-XL) 24 hr tablet 100 mg (has no administration in time range)  simvastatin (ZOCOR) tablet 20 mg (has no administration in time range)  vitamin B-12 (CYANOCOBALAMIN) tablet 1,000 mcg (has no administration in time range)    ED Course  I have reviewed the triage vital signs and the nursing notes.  Pertinent labs & imaging results that were available during my care of the patient were reviewed by me and considered in my medical decision making (see chart for details).    MDM Rules/Calculators/A&P                           70 yo F [facility] here [facility] behavioral [facility]  Home meds were ordered.  Chest x-ray ordered as per protocol for someone greater than 65 awaiting mental health evaluation.  The patients results and plan were reviewed and discussed.   Any x-rays performed were independently reviewed by myself.   Differential diagnosis were considered with the presenting HPI.  Medications  citalopram (CELEXA) tablet 20 mg (has no administration in time range)  donepezil (ARICEPT) tablet 10 mg (10 mg Oral Given 02/22/21 0209)  hydrochlorothiazide (HYDRODIURIL) tablet 25 mg (has no administration  in time range)  memantine [facility]) tablet 10 mg (10 mg Oral Given 02/22/21 0209)  metoprolol succinate (TOPROL-XL) 24 hr tablet 100 mg (has no administration in time range)  simvastatin (ZOCOR) tablet 20 mg (has no administration in time range)  vitamin B-12 (CYANOCOBALAMIN) tablet 1,000 mcg (has no administration in time range)    Vitals:   02/21/21 2327  BP: (!) 178/92  Pulse: 70  Resp: 16  Temp:  97.7 F (36.5 C)  TempSrc: Oral  SpO2: 100%    Final diagnoses:  None    Final Clinical Impression(s) / ED Diagnoses Final diagnoses:  None    Rx / DC Orders ED Discharge Orders     None        Melene Plan, DO 02/22/21 0217

## 2021-02-22 NOTE — ED Notes (Signed)
Pt wanded by security. 

## 2021-02-22 NOTE — ED Notes (Signed)
Pt laying in bed at this time. Warm blankets provided.

## 2021-02-22 NOTE — Progress Notes (Signed)

## 2021-02-22 NOTE — ED Notes (Signed)
Pt belongings inventoried and placed in locker 1 

## 2021-02-22 NOTE — ED Notes (Signed)
Called SW, left detailed message for RN Eulis Foster.

## 2021-02-22 NOTE — ED Notes (Signed)
Pt accepted at [facility]. [facility] provider, Lamar Sprinkles, MD, recommended inpatient treatment. [facility] provider, Kevan Ny, MD will accept patient. Call report to #[phone]. [place] would like patient to arrive before midnight, 02/22/2021. If she is unable to arrive by midnight due to transportation reasons, the facility will accept her 02/23/2021 after 8am

## 2021-02-22 NOTE — ED Notes (Signed)
Officer Brayton Mars returned call from [employer]. Pt will not be able to be transported tonight. He left a message with Research scientist (medical) for tomorrow. Officer should call tomorrow morning around 0800 for transport.

## 2021-02-22 NOTE — ED Notes (Signed)
Rn to assess pt. Pt asking why she is here and what are we doing all these test for. Rn explained to pt that she was IVC and we needed to medically clear her before she could speak to psych. Pt continued to ask how long she would be here and when she will get to go home. Rn explained that she would be here until assessed and then psych would determine the next steps. Pt asked why she was IVC. Pt told because she has made the comment that she wanted to end her life. Pt states that she has only said it once, but would never try to kill herself because she is a [name]. Pt continues to ask same questions over. Pt is being cooperative and calm just seems agitated to be in this situation.

## 2021-02-22 NOTE — ED Notes (Signed)
Pt requested to call friend Danella Sensing. Pt spoke to [place] and pt asked this nurse to speak to [place] as well. Danella Sensing is very concerned that pt is going so far away. Pt continues to ask the same thing over and over again, asking how long she will be there and if she is required to go. This nurse continued to attempt to explain to pt that she had an IVC so she will not be allow to leave and that this nurse was unable to to tell pt how long she would be at the facility. Pt continues to ask over and over again. Pt redirected to return to room.

## 2021-02-22 NOTE — ED Notes (Signed)
Pt's POA's Madison Glover and Madison Glover provided this RN with POA paperwork and requested that no updates or information be given to the Pt's daughter or anyone who is not POA. This RN made copies of POA paperwork and placed in Medical Records and returned Original POA paperwork back. POA asking for updates. This RN updated the POA letting her know that the Pt was accepted at [facility] and will be transported to the facility tomorrow morning. Pt's POA expresses concerns with that decision stating that no one informed them that was the final decision. POA requesting that the pt NOT be sent to [facility] due to it being too far away and that they will not be able to take care of her. POA also expresses concerns that the pt will decline in health if she is sent far away to another facility. POA requesting that the pt be placed in a closer facility. This RN currently on phone with [facility] explaining the situation, waiting on further instructions.

## 2021-02-22 NOTE — ED Provider Notes (Signed)
Patient's labs are reassuring and she is medically clear for psychiatric evaluation and placement.   Gwyneth Sprout, MD 02/22/21 [phone]

## 2021-02-23 DIAGNOSIS — F0390 Unspecified dementia without behavioral disturbance: Secondary | ICD-10-CM | POA: Diagnosis not present

## 2021-02-23 DIAGNOSIS — I959 Hypotension, unspecified: Secondary | ICD-10-CM | POA: Diagnosis not present

## 2021-02-23 DIAGNOSIS — I10 Essential (primary) hypertension: Secondary | ICD-10-CM | POA: Diagnosis not present

## 2021-02-23 DIAGNOSIS — E785 Hyperlipidemia, unspecified: Secondary | ICD-10-CM | POA: Diagnosis not present

## 2021-02-23 DIAGNOSIS — K5909 Other constipation: Secondary | ICD-10-CM | POA: Diagnosis not present

## 2021-02-23 DIAGNOSIS — E568 Deficiency of other vitamins: Secondary | ICD-10-CM | POA: Diagnosis not present

## 2021-02-23 DIAGNOSIS — E782 Mixed hyperlipidemia: Secondary | ICD-10-CM | POA: Diagnosis not present

## 2021-02-23 DIAGNOSIS — F413 Other mixed anxiety disorders: Secondary | ICD-10-CM | POA: Diagnosis not present

## 2021-02-23 DIAGNOSIS — K588 Other irritable bowel syndrome: Secondary | ICD-10-CM | POA: Diagnosis not present

## 2021-02-23 DIAGNOSIS — N390 Urinary tract infection, site not specified: Secondary | ICD-10-CM | POA: Diagnosis not present

## 2021-02-23 DIAGNOSIS — K219 Gastro-esophageal reflux disease without esophagitis: Secondary | ICD-10-CM | POA: Diagnosis not present

## 2021-02-23 DIAGNOSIS — M150 Primary generalized (osteo)arthritis: Secondary | ICD-10-CM | POA: Diagnosis not present

## 2021-02-23 DIAGNOSIS — Z79899 Other long term (current) drug therapy: Secondary | ICD-10-CM | POA: Diagnosis not present

## 2021-02-23 DIAGNOSIS — Z712 Person consulting for explanation of examination or test findings: Secondary | ICD-10-CM | POA: Diagnosis not present

## 2021-02-23 DIAGNOSIS — A539 Syphilis, unspecified: Secondary | ICD-10-CM | POA: Diagnosis not present

## 2021-02-23 DIAGNOSIS — R6889 Other general symptoms and signs: Secondary | ICD-10-CM | POA: Diagnosis not present

## 2021-02-23 DIAGNOSIS — E559 Vitamin D deficiency, unspecified: Secondary | ICD-10-CM | POA: Diagnosis not present

## 2021-02-23 DIAGNOSIS — F332 Major depressive disorder, recurrent severe without psychotic features: Secondary | ICD-10-CM | POA: Diagnosis not present

## 2021-02-23 DIAGNOSIS — M810 Age-related osteoporosis without current pathological fracture: Secondary | ICD-10-CM | POA: Diagnosis not present

## 2021-02-23 DIAGNOSIS — A599 Trichomoniasis, unspecified: Secondary | ICD-10-CM | POA: Diagnosis not present

## 2021-02-23 DIAGNOSIS — F339 Major depressive disorder, recurrent, unspecified: Secondary | ICD-10-CM | POA: Diagnosis not present

## 2021-02-23 DIAGNOSIS — R4181 Age-related cognitive decline: Secondary | ICD-10-CM | POA: Diagnosis not present

## 2021-02-23 DIAGNOSIS — F32A Depression, unspecified: Secondary | ICD-10-CM | POA: Diagnosis not present

## 2021-02-23 NOTE — ED Notes (Signed)
Patient was able to speak with POA [name] prior to transport.

## 2021-02-23 NOTE — ED Notes (Signed)
Pt resting. Even Resp. No distress at this time. 

## 2021-02-23 NOTE — ED Provider Notes (Signed)
Emergency Medicine Observation Re-evaluation Note  Madison Glover is a 70 y.o. female, seen on rounds today.  Pt initially presented to the ED for complaints of depression. No new issues overnight. Pt is scheduled to go to [facility] this AM for gerospsych care.   Physical Exam  BP (!) 147/85   Pulse 67   Temp 97.7 F (36.5 C)   Resp 15   Ht 1.448 m (4\' 9" )   Wt 60.1 kg   SpO2 99%   BMI 28.67 kg/m  Physical Exam General: resting Cardiac: regular rate Lungs: breathing comfortably Psych: calm, resting.   ED Course / MDM  EKG:   I have reviewed the labs performed to date as well as medications administered while in observation.  Recent changes in the last 24 hours include stabilization on meds, BH reassessment.   Plan  Current plan is for transfer to [facility] this AM.  Pt appears stable for transport/transfer.         [facility], MD 02/23/21 [phone]

## 2021-02-23 NOTE — Discharge Instructions (Addendum)
Transfer/transport patient to [facility]

## 2021-02-23 NOTE — ED Notes (Signed)
This RN spoke with patient's POA [name] regarding access to patient information. [name] provided the password "friends". This RN informed POA that transport will be set up this morning for patient to be transferred after 8AM. POA states that someone will be bringing clothes to the ED for the patient.

## 2021-02-23 NOTE — ED Notes (Signed)
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 Patient insists that she is going home and repeatedly asks to speak to [place].

## 2021-02-23 NOTE — ED Notes (Signed)
Patient cared transferred to [facility] for transport to [facility] for inpatient admission. Patient is alert and confused at time of discharge. Repeats herself and wants to talk with her brother Madison Glover. It was explained to the patient that after she has been evaluated and cleared by the next physician at [facility], her brother or POA may pick her up or che can be transported home by [employer].

## 2021-02-23 NOTE — ED Notes (Signed)
Pt resting. Even Resp. No distress at this time.

## 2021-02-26 ENCOUNTER — Telehealth: Payer: Self-pay | Admitting: *Deleted

## 2021-02-26 ENCOUNTER — Ambulatory Visit: Payer: Medicare PPO | Admitting: Internal Medicine

## 2021-02-26 DIAGNOSIS — I10 Essential (primary) hypertension: Secondary | ICD-10-CM

## 2021-02-26 DIAGNOSIS — G319 Degenerative disease of nervous system, unspecified: Secondary | ICD-10-CM

## 2021-02-26 DIAGNOSIS — E7800 Pure hypercholesterolemia, unspecified: Secondary | ICD-10-CM

## 2021-02-26 NOTE — Telephone Encounter (Signed)
-----   Message from Verner Chol, [facility] sent at 02/26/2021 10:01 AM EDT ----- Regarding: CCM referral Hi,  Can you possibly put in a CCM referral for this patient to see me? She wants adherence packaging and help with her medication regimen.  Thank you, Maddie

## 2021-02-26 NOTE — Telephone Encounter (Signed)
Referral placed.

## 2021-02-26 NOTE — Progress Notes (Signed)
I do not see her on [facility]. I would need a referral to schedule and she will have a $20 copay which I will mention when scheduling.

## 2021-02-27 ENCOUNTER — Ambulatory Visit (INDEPENDENT_AMBULATORY_CARE_PROVIDER_SITE_OTHER): Payer: Medicare PPO | Admitting: *Deleted

## 2021-02-27 ENCOUNTER — Telehealth: Payer: Self-pay | Admitting: Internal Medicine

## 2021-02-27 DIAGNOSIS — G319 Degenerative disease of nervous system, unspecified: Secondary | ICD-10-CM

## 2021-02-27 DIAGNOSIS — F331 Major depressive disorder, recurrent, moderate: Secondary | ICD-10-CM

## 2021-02-27 DIAGNOSIS — F339 Major depressive disorder, recurrent, unspecified: Secondary | ICD-10-CM

## 2021-02-27 DIAGNOSIS — R45851 Suicidal ideations: Secondary | ICD-10-CM

## 2021-02-27 DIAGNOSIS — R413 Other amnesia: Secondary | ICD-10-CM

## 2021-02-27 DIAGNOSIS — M810 Age-related osteoporosis without current pathological fracture: Secondary | ICD-10-CM

## 2021-02-27 DIAGNOSIS — I10 Essential (primary) hypertension: Secondary | ICD-10-CM

## 2021-02-27 NOTE — Progress Notes (Signed)
  Chronic Care Management   Outreach Note  02/27/2021 Name: Madison Glover MRN: [id number] DOB: 06-12-1951  Referred by: Philip Aspen, Limmie Patricia, MD Reason for referral : No chief complaint on file.   An unsuccessful telephone outreach was attempted today. The patient was referred to the pharmacist for assistance with care management and care coordination.   Follow Up Plan:  Tatjana Dellinger Upstream Scheduler

## 2021-02-28 NOTE — Patient Instructions (Signed)
Visit Information   PATIENT GOALS:   Goals Addressed             This Visit's Progress    Reduce and Manage My Symptoms of Depression.   On track    Timeframe:  Long-Range Goal Priority:  High Start Date:   02/27/2021                        Expected End Date:  04/29/2021                  Follow-Up Date:  03/07/2021 at 9:00am.  Patient Goals/Self-Care Activities: Journal feelings and what helps to feel better or worse. Spend time with friends and engage in fun activities, at least 2 to 3 times per week. Watch for early signs of feeling worse. Begin personal counseling with LCSW on a weekly basis, to reduce and manage symptoms of depression and suicidal ideation, until established with [place]. Verbal consent obtained to place referral to [facility] for ongoing mental health counseling and supportive services. Keep weekly counseling sessions with Kizzie Ide, LCSW with [facility] the [place].   Practice relaxation techniques, deep breathing exercises and mindfulness meditation strategies, daily. Continue with compliance of taking prescription medications, try to obtain adequate rest, stay well-hydrated and eat a healthy, well-balanced diet.          Consent to CCM Services: Ms. Kemmerling was given information about Chronic Care Management services including:  CCM service includes personalized support from designated clinical staff supervised by her physician, including individualized plan of care and coordination with other care providers 24/7 contact phone numbers for assistance for urgent and routine care needs. Service will only be billed when office clinical staff spend 20 minutes or more in a month to coordinate care. Only one practitioner may furnish and bill the service in a calendar month. The patient may stop CCM services at any time (effective at the end of the month) by phone call to the office staff. The patient will be responsible for cost sharing  (co-pay) of up to 20% of the service fee (after annual deductible is met).  Patient agreed to services and verbal consent obtained.   Patient verbalizes understanding of instructions provided today and agrees to view in Portal.   Telephone follow-up appointment with care management team member scheduled for:  03/07/2021 at [place] LCSW Licensed Clinical Social Worker [facility] [phone]   CLINICAL CARE PLAN: Patient Care Plan: RNCM:Dementia (Adult)     Problem Identified: Potential of Harm or Injury related to dementia   Priority: High     Long-Range Goal: Harm or Injury Prevented related to dementia   Start Date: 02/18/2021  Expected End Date: 07/28/2021  This Visit's Progress: On track  Priority: High  Note:   Current Barriers:  Ineffective Self Health Maintenance in a patient with Dementia Unable to independently self manage dementia Unable to self administer medications as prescribed Unable to perform IADLs independently Spoke with [place] friend and brother Frederico Hamman Ridings(lives in [place]).  States that pt has become more forgetful and not taking care of herself over the last year or so.  [name] states that she, Letta Median Henry(DPOA) and another friend from church Ellie help pt with keeping up with appointments, medications and paying bills.  States that they have just hired a aide to come for 3 hours a week to help pt as needed.  States pt no longer drives  and has had her  drivers licence taken away.  States she walks to store that is near by to shop sometimes.  States she is estranged from one of her daughters and her other daughter lives in [place].  States that they are concerned that pt will not be able to safely stay alone and will need to move to assisted living.  States that Ellie fills pts medication boxes for the week and that pt ofter does not take her bedtime pill.   Clinical Goal(s):  Collaboration with Isaac Bliss, Rayford Halsted,  MD regarding development and update of comprehensive plan of care as evidenced by provider attestation and co-signature Inter-disciplinary care team collaboration (see longitudinal plan of care) patient will work with care management team to address care coordination and chronic disease management needs related to Disease Management Educational Needs Care Coordination Medication Management and Education Psychosocial Support Caregiver Stress support Dementia and Caregiver Support Level of Care Concerns   Interventions:  Evaluation of current treatment plan related to HTN, HLD, and Dementia, Level of care concerns, ADL IADL limitations, Family and relationship dysfunction, Cognitive Deficits, Memory Deficits, Inability to perform IADL's independently, and Lacks knowledge of community resource: dementia  self-management and patient's adherence to plan as established by provider. Collaboration with Isaac Bliss, Rayford Halsted, MD regarding development and update of comprehensive plan of care as evidenced by provider attestation       and co-signature Inter-disciplinary care team collaboration (see longitudinal plan of care) Discussed plans with patient for ongoing care management follow up and provided patient with direct contact information for care management team Referred to CCM social works for level of care issues, caregiver stress and resources Referred to CCM pharmacist for medication adherence issues, possible simplification of medication regimen, and pill packaging  Reviewed home safety issues with caregivers   Self Care Activities:  Self administers medications as prescribed Attends all scheduled provider appointments Calls pharmacy for medication refills Attends church or other social activities Calls provider office for new concerns or questions Patient Goals: - attend dementia support group - check out dementia website - check out other places when staying at home is no longer  possible (assisted living center, nursing home) - check out services like in-home help or adult day care - check with a financial planner - connect with the local or national dementia organization - hold a family meeting with family members and loved ones - list the symptoms that would make staying at home too hard - make a list of future care and financial needs - make a list of people who can help and what they can do Follow Up Plan: Telephone follow up appointment with care management team member scheduled for: 03/22/21 The patient has been provided with contact information for the care management team and has been advised to call with any health related questions or concerns.      Patient Care Plan: RNCM:Cardiovascular disease (HTN and HLD)     Problem Identified: Lack of long term Cardiovascular disease Management (HTN and HLD)   Priority: Medium     Long-Range Goal: Effective Cardiovascular disease Self Management (HTN and HLD)   Start Date: 02/18/2021  Expected End Date: 07/28/2021  This Visit's Progress: On track  Priority: Medium  Note:   Current Barriers:  Knowledge Deficits related to basic understanding of Cardiovascular disease (HTN and HLD) pathophysiology and self care management Unable to independently Self manage Cardiovascular disease (HTN and HLD) Unable to self administer medications as prescribed  Unable to perform IADLs independently Caregiver states that pt does not check B/P at home and does not have a monitor.  Caregiver(HCPOA )states she has monitor and will check B/P Nurse Case Manager Clinical Goal(s):  patient will verbalize understanding of plan for hypertension management patient will not experience hospital admission. Hospital Admissions in last 6 months = 0 patient will attend all scheduled medical appointments: no upcoming primary care provider patient will demonstrate improved adherence to prescribed treatment plan for hypertension as evidenced by  taking all medications as prescribed, monitoring and recording blood pressure as directed, adhering to low sodium/DASH diet patient will demonstrate improved health management independence as evidenced by checking blood pressure as directed and notifying PCP if SBP>160 or DBP > 90, taking all medications as prescribe, and adhering to a low sodium diet as discussed. patient will verbalize basic understanding of hypertension disease process and self health management plan as evidenced by readings within limits and adherence to medications  Interventions:  Collaboration with Isaac Bliss, Rayford Halsted, MD regarding development and update of comprehensive plan of care as evidenced by provider attestation and co-signature Inter-disciplinary care team collaboration (see longitudinal plan of care) Evaluation of current treatment plan related to hypertension self management and patient's adherence to plan as established by provider. Provided education to patient re: stroke prevention, s/s of heart attack and stroke, DASH diet, complications of uncontrolled blood pressure Reviewed medications with patient and discussed importance of compliance Discussed plans with patient for ongoing care management follow up and provided patient with direct contact information for care management team Advised patient, providing education and rationale, to monitor blood pressure daily and record, calling PCP for findings outside established parameters.  Reviewed scheduled/upcoming provider appointments including: no upcoming primary care provider Reviewed to contact pts insurance company to order B/P monitor from over the counter catalog  Referred to CCM social works for level of care issues, caregiver stress and resources Referred to CCM pharmacist for medication adherence issues, possible simplification of medication regimen, and pill packaging  Self-Care Activities:  Self administers medications as prescribed Attends all  scheduled provider appointments Calls pharmacy for medication refills Attends church or other social activities Calls provider office for new concerns or questions Patient Goals  - Self administer medications as prescribed  - Attend all scheduled provider appointments  - Call provider office for new concerns, questions, or BP outside discussed parameters  - Check BP and record as discussed  - Follows a low sodium diet/DASH diet - check blood pressure weekly - choose a place to take my blood pressure (home, clinic or office, retail store) - write blood pressure results in a log or diary - ask questions to understand Follow Up Plan: Telephone follow up appointment with care management team member scheduled for: 03/22/21 The patient has been provided with contact information for the care management team and has been advised to call with any health related questions or concerns.      Patient Care Plan: LCSW Plan of Care     Problem Identified: Reduce and Manage My Symptoms of Depression.   Priority: High     Long-Range Goal: Reduce and Manage My Symptoms of Depression.   Start Date: 02/27/2021  Expected End Date: 04/29/2021  This Visit's Progress: On track  Priority: High  Note:   Current Barriers:   Acute Mental Health needs related to Dementia, Recurrent Major Depressive Disorder - Moderate, Suicidal Ideation, Neurodegenerative Cognitive Impairment and Memory Loss.   Mental Health Concerns, Memory Deficits,  Limited Access to Caregiver, and [employer]. Needs Support, Education, and Care Coordination in order to meet unmet mental health needs. Clinical Goal(s):  Over the next 60 days, patient will work with LCSW to reduce and manage symptoms of Depression and Suicidal Ideation.   Patient will increase knowledge and/or ability of:        Coping Skills, Healthy Habits, Self-Management Skills, Stress Reduction, Home Safety and Utilizing [employer] and  Resources.  Clinical Interventions:  Assessed patient's previous treatment, needs, coping skills, current treatment, support system and barriers to care. PHQ-2 and PHQ-9 Depression Screening Tool performed and results reviewed. Other interventions included:       Solution-Focused Therapy Performed, Mindfulness Meditation Strategies, Relaxation Techniques and Deep Breathing Exercises Encouraged, Active Listening/       Reflection Utilized, Emotional Support Provided, Problem Solving [place], Psychoeducation /Health Education, Motivational        Interviewing, Brief Cognitive Behavioral Therapy Initiated, Reviewed Mental Health Medications and Discussed Compliance, Quality of Sleep Assessed and       Sleep Hygiene Techniques Promoted, Support Group Participation Encouraged, Increase Level of Activity/Exercise, Verbalization of Feelings Encouraged,        Crisis Resource Education/Information Provided, Suicidal Ideation/Homicidal Ideation Assessed.  Patient and friends interviewed and appropriate assessments performed. Discussed plans with patient for ongoing care management follow-up and provided patient and friends with direct contact information for care management team. Collaborated with [facility] to place referral for ongoing mental health counseling and supportive services.   Discussed several options for long-term counseling based on need and insurance.  Collaborated with Kizzie Ide (# [phone]), LCSW with [facility] [place], regarding current psychiatric admission to [place] for depression and suicidal ideation. Collaborated with Colletta Maryland (# [phone]; Code # [id number]), LCSW with [facility], regarding current treatment plan and discharge planning arrangements. Collaborated with Primary Care Physician, Dr. Lelon Frohlich regarding development and update of comprehensive plan of care as  evidenced by provider attestation and co-signature. Inter-disciplinary care team collaboration (see longitudinal plan of care). Patient Goals/Self-Care Activities: Journal feelings and what helps to feel better or worse. Spend time with friends and engage in fun activities, at least 2 to 3 times per week. Watch for early signs of feeling worse. Begin personal counseling with LCSW on a weekly basis, to reduce and manage symptoms of depression and suicidal ideation, until established with [place]. Verbal consent obtained to place referral to [facility] for ongoing mental health counseling and supportive services. Keep weekly counseling sessions with Kizzie Ide, LCSW with [facility] the [place].   Practice relaxation techniques, deep breathing exercises and mindfulness meditation strategies, daily. Continue with compliance of taking prescription medications, try to obtain adequate rest, stay well-hydrated and eat a healthy, well-balanced diet. Follow-Up:  03/07/2021 at 9:00am.

## 2021-02-28 NOTE — Chronic Care Management (AMB) (Signed)
Chronic Care Management    Clinical Social Work Note  02/28/2021 Name: Madison Glover MRN: [id number] DOB: Aug 11, 1950  Madison Glover is a 70 y.o. year old female who is a primary care patient of Philip Aspen, Limmie Patricia, MD. The CCM team was consulted to assist the patient with chronic disease management and/or care coordination needs related to: Mental Health Counseling and Resources.   Engaged with patient and friends, [name] and [name] by telephone for initial visit in response to provider referral for social work chronic care management and care coordination services.   Consent to Services:  The patient was given information about Chronic Care Management services, agreed to services, and gave verbal consent prior to initiation of services.  Please see initial visit note for detailed documentation.   Patient agreed to services and consent obtained.   Assessment: Review of patient past medical history, allergies, medications, and health status, including review of relevant consultants reports was performed today as part of a comprehensive evaluation and provision of chronic care management and care coordination services.     SDOH (Social Determinants of Health) assessments and interventions performed:  SDOH Interventions    Flowsheet Row Most Recent Value  SDOH Interventions   Food Insecurity Interventions Intervention Not Indicated, Other (Comment)  [Verified by friends - [name] and [name] Russell]  Financial Strain Interventions Intervention Not Indicated, Other (Comment)  [Verified by friends - [name] and [name] Russell]  Housing Interventions Intervention Not Indicated, Other (Comment)  [Verified by friends - [name] and [name] Russell]  Intimate Partner Violence Interventions Intervention Not Indicated, Other (Comment)  [Verified by friends - [name] and [name] Russell]  Physical Activity Interventions Intervention Not Indicated, Other (Comments)   [Verified by friends - [name] and [name] Russell]  Stress Interventions Offered [employer], Provide Counseling, Other (Comment)  [Referred to [employer].  Verified by friends - [name] and [name] Russell]  Social Connections Interventions Intervention Not Indicated, Other (Comment)  [Verified by friends - [name] and [name] Russell]  Transportation Interventions [employer], Other (Comment)  [Verified [employer] Manufacturing engineer, as well as [employer] with friends - [name] and [name] Russell.]  Depression Interventions/Treatment  Referral to Psychiatry, Medication, Counseling        Advanced Directives Status: See Care Plan for related entries.  CCM Care Plan  Allergies  Allergen Reactions   Bee Venom Swelling    Outpatient Encounter Medications as of 02/27/2021  Medication Sig   alendronate (FOSAMAX) 70 MG tablet TAKE 1 TABLET BY MOUTH EVERY 7 DAYS WITH A FULL GLASS OF WATER ON AN EMPTY STOMACH. (Patient taking differently: Take 70 mg by mouth once a week.)   citalopram (CELEXA) 20 MG tablet Take 1 tablet (20 mg total) by mouth daily.   donepezil (ARICEPT) 10 MG tablet Take 1 tablet (10 mg total) by mouth at bedtime.   hydrochlorothiazide (HYDRODIURIL) 25 MG tablet TAKE 1 TABLET BY MOUTH EVERY DAY (Patient taking differently: Take 25 mg by mouth daily.)   LOW-DOSE ASPIRIN PO Take 81 mg by mouth daily. Take 1 tablet daily   memantine (NAMENDA) 10 MG tablet Take 1 tablet (10 mg total) by mouth 2 (two) times daily.   metoprolol succinate (TOPROL-XL) 100 MG 24 hr tablet TAKE 1 TABLET BY MOUTH EVERY DAY   Metoprolol Succinate 100 MG CS24 Take 100 mg by mouth daily.   mupirocin ointment (BACTROBAN) 2 % Place 1 application  into the nose 2 (two) times daily.   simvastatin (ZOCOR) 20 MG tablet TAKE 1 TABLET (20 MG TOTAL) BY MOUTH DAILY AT 6 PM.   vitamin B-12 (CYANOCOBALAMIN) 1000 MCG tablet Take 1,000  mcg by mouth daily.   No facility-administered encounter medications on file as of 02/27/2021.    Patient Active Problem List   Diagnosis Date Noted   MDD (major depressive disorder), recurrent episode, moderate (HCC)    Suicidal ideation    Neurodegenerative cognitive impairment (HCC) 11/06/2020   Positive colorectal cancer screening using Cologuard test    Encounter for screening colonoscopy 10/17/2020   Vitamin D deficiency 07/31/2020   Osteoporosis 05/02/2020   Hypertension    High cholesterol    Depression, recurrent (HCC)    Memory loss 08/02/2019   Vitamin B12 deficiency 08/02/2019    Conditions to be addressed/monitored: Depression and Dementia.  Limited Social Support, Mental Health Concerns, Social Isolation, Limited Access to Caregiver, Cognitive Deficits, Memory Deficits, and Lacks Knowledge of [employer].  Care Plan : LCSW Plan of Care  Updates made by [name], LCSW since 02/28/2021 12:00 AM     Problem: Reduce and Manage My Symptoms of Depression.   Priority: High     Long-Range Goal: Reduce and Manage My Symptoms of Depression.   Start Date: 02/27/2021  Expected End Date: 04/29/2021  This Visit's Progress: On track  Priority: High  Note:   Current Barriers:   Acute Mental Health needs related to Dementia, Recurrent Major Depressive Disorder - Moderate, Suicidal Ideation, Neurodegenerative Cognitive Impairment and Memory Loss.   Mental Health Concerns, Memory Deficits, Limited Access to Caregiver, and Lacks Knowledge of [employer]. Needs Support, Education, and Care Coordination in order to meet unmet mental health needs. Clinical Goal(s):  Over the next 60 days, patient will work with LCSW to reduce and manage symptoms of Depression and Suicidal Ideation.   Patient will increase knowledge and/or ability of:        Coping Skills, Healthy Habits, Self-Management Skills, Stress Reduction, Home Safety and Utilizing [employer] and  Resources.  Clinical Interventions:  Assessed patient's previous treatment, needs, coping skills, current treatment, support system and barriers to care. PHQ-2 and PHQ-9 Depression Screening Tool performed and results reviewed. Other interventions included:       Solution-Focused Therapy Performed, Mindfulness Meditation Strategies, Relaxation Techniques and Deep Breathing Exercises Encouraged, Active Listening/       Reflection Utilized, Emotional Support Provided, Problem Solving /Task Center Solutions Developed, Psychoeducation /Health Education, Motivational        Interviewing, Brief Cognitive Behavioral Therapy Initiated, Reviewed Mental Health Medications and Discussed Compliance, Quality of Sleep Assessed and       Sleep Hygiene Techniques Promoted, Support Group Participation Encouraged, Increase Level of Activity/Exercise, Verbalization of Feelings Encouraged,        Crisis Resource Education/Information Provided, Suicidal Ideation/Homicidal Ideation Assessed.  Patient and friends interviewed and appropriate assessments performed. Discussed plans with patient for ongoing care management follow-up and provided patient and friends with direct contact information for care management team. Collaborated with [facility] to place referral for ongoing mental health counseling and supportive services.   Discussed several options for long-term counseling based on need and insurance.  Collaborated with Hoy Register (# [phone]. [phone]), LCSW with [facility] [place], regarding current psychiatric admission to [place] for depression and suicidal ideation. Collaborated with Judeth Cornfield (# [phone]; Code # [id number]), LCSW with [facility], regarding current treatment plan and  discharge planning arrangements. Collaborated with Primary Care Physician, Dr. [name] regarding development and update of comprehensive plan of care as  evidenced by provider attestation and co-signature. Inter-disciplinary care team collaboration (see longitudinal plan of care). Patient Goals/Self-Care Activities: Journal feelings and what helps to feel better or worse. Spend time with friends and engage in fun activities, at least 2 to 3 times per week. Watch for early signs of feeling worse. Begin personal counseling with LCSW on a weekly basis, to reduce and manage symptoms of depression and suicidal ideation, until established with [facility]. Verbal consent obtained to place referral to [facility] for ongoing mental health counseling and supportive services. Keep weekly counseling sessions with Hoy Register, LCSW with [facility] the [place].   Practice relaxation techniques, deep breathing exercises and mindfulness meditation strategies, daily. Continue with compliance of taking prescription medications, try to obtain adequate rest, stay well-hydrated and eat a healthy, well-balanced diet. Follow-Up:  03/07/2021 at 9:00am.       Follow-Up Plan:   LCSW will follow-up with patient by phone on 03/07/2021 at 9:00am.      Danford Bad LCSW Licensed Clinical Social Worker [facility] [phone]

## 2021-03-05 ENCOUNTER — Telehealth: Payer: Self-pay | Admitting: Internal Medicine

## 2021-03-05 NOTE — Chronic Care Management (AMB) (Signed)
  Chronic Care Management   Note  03/05/2021 Name: Ellisha Bankson Remmers MRN: [id number] DOB: [birth year]  Violet Baldy Moening is a 69 y.o. year old female who is a primary care patient of Philip Aspen, Limmie Patricia, MD. I reached out to Violet Baldy Vanness by phone today in response to a referral sent by Ms. Violet Baldy Batt's PCP, Philip Aspen, Limmie Patricia, MD.   Ms. Mozingo was given information about Chronic Care Management services today including:  CCM service includes personalized support from designated clinical staff supervised by her physician, including individualized plan of care and coordination with other care providers 24/7 contact phone numbers for assistance for urgent and routine care needs. Service will only be billed when office clinical staff spend 20 minutes or more in a month to coordinate care. Only one practitioner may furnish and bill the service in a calendar month. The patient may stop CCM services at any time (effective at the end of the month) by phone call to the office staff.   [name] verbally agreed to assistance and services provided by embedded care coordination/care management team today.  Follow up plan: [name] aware of $20 copay   Tatjana Dellinger [facility]

## 2021-03-07 ENCOUNTER — Ambulatory Visit: Payer: Medicare PPO | Admitting: *Deleted

## 2021-03-07 DIAGNOSIS — F331 Major depressive disorder, recurrent, moderate: Secondary | ICD-10-CM

## 2021-03-07 DIAGNOSIS — R413 Other amnesia: Secondary | ICD-10-CM

## 2021-03-07 DIAGNOSIS — F339 Major depressive disorder, recurrent, unspecified: Secondary | ICD-10-CM

## 2021-03-07 DIAGNOSIS — G319 Degenerative disease of nervous system, unspecified: Secondary | ICD-10-CM

## 2021-03-07 DIAGNOSIS — R45851 Suicidal ideations: Secondary | ICD-10-CM

## 2021-03-07 NOTE — Chronic Care Management (AMB) (Signed)
Chronic Care Management    Clinical Social Work Note  03/07/2021 Name: Madison Glover MRN: [id number] DOB: 1950-10-17  Madison Glover is a 70 y.o. year old female who is a primary care patient of Philip Aspen, Limmie Patricia, MD. The CCM team was consulted to assist the patient with chronic disease management and/or care coordination needs related to: [employer], Level of Care Concerns, and Mental Health Counseling and Resources.   Engaged with patient's friends, [name] and [name] by telephone for follow-up visit in response to provider referral for social work chronic care management and care coordination services.   Consent to Services:  The patient was given information about Chronic Care Management services, agreed to services, and gave verbal consent prior to initiation of services.  Please see initial visit note for detailed documentation.   Patient agreed to services and consent obtained.   Assessment: Review of patient past medical history, allergies, medications, and health status, including review of relevant consultants reports was performed today as part of a comprehensive evaluation and provision of chronic care management and care coordination services.     SDOH (Social Determinants of Health) assessments and interventions performed:    Advanced Directives Status: Not addressed in this encounter.  CCM Care Plan  Allergies  Allergen Reactions   Bee Venom Swelling    Outpatient Encounter Medications as of 03/07/2021  Medication Sig   alendronate (FOSAMAX) 70 MG tablet TAKE 1 TABLET BY MOUTH EVERY 7 DAYS WITH A FULL GLASS OF WATER ON AN EMPTY STOMACH. (Patient taking differently: Take 70 mg by mouth once a week.)   citalopram (CELEXA) 20 MG tablet Take 1 tablet (20 mg total) by mouth daily.   donepezil (ARICEPT) 10 MG tablet Take 1 tablet (10 mg total) by mouth at bedtime.   hydrochlorothiazide (HYDRODIURIL) 25 MG tablet TAKE 1 TABLET BY MOUTH  EVERY DAY (Patient taking differently: Take 25 mg by mouth daily.)   LOW-DOSE ASPIRIN PO Take 81 mg by mouth daily. Take 1 tablet daily   memantine (NAMENDA) 10 MG tablet Take 1 tablet (10 mg total) by mouth 2 (two) times daily.   metoprolol succinate (TOPROL-XL) 100 MG 24 hr tablet TAKE 1 TABLET BY MOUTH EVERY DAY   Metoprolol Succinate 100 MG CS24 Take 100 mg by mouth daily.   mupirocin ointment (BACTROBAN) 2 % Place 1 application into the nose 2 (two) times daily.   simvastatin (ZOCOR) 20 MG tablet TAKE 1 TABLET (20 MG TOTAL) BY MOUTH DAILY AT 6 PM.   vitamin B-12 (CYANOCOBALAMIN) 1000 MCG tablet Take 1,000 mcg by mouth daily.   No facility-administered encounter medications on file as of 03/07/2021.    Patient Active Problem List   Diagnosis Date Noted   MDD (major depressive disorder), recurrent episode, moderate (HCC)    Suicidal ideation    Neurodegenerative cognitive impairment (HCC) 11/06/2020   Positive colorectal cancer screening using Cologuard test    Encounter for screening colonoscopy 10/17/2020   Vitamin D deficiency 07/31/2020   Osteoporosis 05/02/2020   Hypertension    High cholesterol    Depression, recurrent (HCC)    Memory loss 08/02/2019   Vitamin B12 deficiency 08/02/2019    Conditions to be addressed/monitored: Anxiety and Depression.  Limited Social Support, Housing Barriers, Level of Care Concerns, ADL/IADL Limitations, Mental Health Concerns, Social Isolation, Limited Access to Caregiver, Cognitive Deficits, Memory Deficits, and Lacks Knowledge of [employer].  Care Plan : LCSW Plan of Care  Updates made by Jailen Lung,  [name], LCSW since 03/07/2021 12:00 AM     Problem: Reduce and Manage My Symptoms of Depression.   Priority: High     Long-Range Goal: Reduce and Manage My Symptoms of Depression.   Start Date: 02/27/2021  Expected End Date: 04/29/2021  This Visit's Progress: On track  Recent Progress: On track  Priority: High  Note:   Current  Barriers:   Acute Mental Health needs related to Dementia, Recurrent Major Depressive Disorder - Moderate, Suicidal Ideation, Neurodegenerative Cognitive Impairment and Memory Loss.   Mental Health Concerns, Memory Deficits, Limited Access to Caregiver, and Lacks Knowledge of [employer]. Needs Support, Education, and Care Coordination in order to meet unmet mental health needs. Clinical Goal(s):  Over the next 60 days, patient will work with LCSW to reduce and manage symptoms of Depression and Suicidal Ideation.   Patient will increase knowledge and/or ability of:        Coping Skills, Healthy Habits, Self-Management Skills, Stress Reduction, Home Safety and Utilizing [employer] and Resources.  Clinical Interventions:  Assessed patient's previous treatment, needs, coping skills, current treatment, support system and barriers to care. PHQ-2 and PHQ-9 Depression Screening Tool performed and results reviewed. Other interventions included:       Solution-Focused Therapy Performed, Mindfulness Meditation Strategies, Relaxation Techniques and Deep Breathing Exercises Encouraged, Active Listening/       Reflection Utilized, Emotional Support Provided, Problem Solving /Task Center Solutions Developed, Psychoeducation /Health Education, Motivational        Interviewing, Brief Cognitive Behavioral Therapy Initiated, Reviewed Mental Health Medications and Discussed Compliance, Quality of Sleep Assessed and       Sleep Hygiene Techniques Promoted, Support Group Participation Encouraged, Increase Level of Activity/Exercise, Verbalization of Feelings Encouraged,        Crisis Resource Education/Information Provided, Suicidal Ideation/Homicidal Ideation Assessed.  Patient and friends interviewed and appropriate assessments performed. Discussed plans with patient for ongoing care management follow-up and provided patient and friends with direct contact information for care management  team. Collaborated with [facility] to place referral for ongoing mental health counseling and supportive services.   Discussed several options for long-term counseling based on need and insurance.  Collaborated with Hoy Register (# [phone] ext. [phone]), LCSW with [facility] [place], regarding current psychiatric admission to [place] for depression and suicidal ideation. Collaborated with Judeth Cornfield (# [phone]; Code # [id number]), LCSW with [facility], regarding current treatment plan and discharge planning arrangements. Collaborated with Primary Care Physician, Dr. [name] regarding development and update of comprehensive plan of care as evidenced by provider attestation and co-signature. Collaboration with Primary Care Physician, Dr. [name] to request completion of an FL-2 Form for long-term memory care assisted living placement. Collaboration with Pharmacist with [facility], Gaylord Shih to assist with medication management and monitoring. Collaboration with daughter, Baxter Hire [place] to assist with long-term memory care assisted living placement in [place], [place]. Collaboration with friends, [name] and [name] to follow-up with [facility] to begin receiving ongoing mental health counseling and supportive services.   Inter-disciplinary care team collaboration (see longitudinal plan of care). Patient Goals/Self-Care Activities: Begin personal counseling with LCSW on a weekly basis, to reduce and manage symptoms of depression and suicidal ideation, until established with [facility]. Referral placed to [facility] for ongoing mental health counseling and supportive services. Friends, [name] and [name] agreed to contact [facility] to inform them of your discharge from [facility], to begin services. Keep weekly  counseling sessions with Hoy Register, LCSW with [facility] the [place].   Practice relaxation techniques, deep breathing exercises and mindfulness meditation strategies, daily. Continue with compliance of taking prescription medications, try to obtain adequate rest, stay well-hydrated and eat a healthy, well-balanced diet. Secure chat message sent to Gaylord Shih, Pharmacist with [facility], to review new psychotropic medication regimen and to ensure compliance. Secure chat message sent to Primary Care Physician, Dr. Peggye Pitt to request completion of an FL-2 Form for placement purposes. Please keep follow-up appointment with Primary Care Physician, Dr. Peggye Pitt, scheduled for 03/08/2021. LCSW will work with your daughter, Baxter Hire [place] to pursue long-term memory care assisted living placement for you in [place] ([place]. LCSW e-mailed your daughter, Baxter Hire Jasek ([email or web address]) a list of long-term memory care assisted living facilities in [place], [place]. Follow-Up:  03/18/2021 at 10:00am       Follow-Up Plan: 03/18/2021 at 10:00am.      Danford Bad LCSW Licensed Clinical Social Worker [facility] [phone]

## 2021-03-07 NOTE — Patient Instructions (Signed)
Visit Information  PATIENT GOALS:  Goals Addressed             This Visit's Progress    Reduce and Manage My Symptoms of Depression.   On track    Timeframe:  Long-Range Goal Priority:  High Start Date:   02/27/2021                        Expected End Date:  04/29/2021                  Follow-Up Date:  03/18/2021 at 10:00am  Patient Goals/Self-Care Activities: Begin personal counseling with LCSW on a weekly basis, to reduce and manage symptoms of depression and suicidal ideation, until established with [facility]. Referral placed to [facility] for ongoing mental health counseling and supportive services. Friends, [name] and [name] agreed to contact [facility] to inform them of your discharge from [facility] on 03/06/2021, to begin services. Keep weekly counseling sessions with Hoy Register, LCSW with [facility] [place].   Practice relaxation techniques, deep breathing exercises and mindfulness meditation strategies, daily. Continue with compliance of taking prescription medications, try to obtain adequate rest, stay well-hydrated and eat a healthy, well-balanced diet. Secure chat message sent to Gaylord Shih, Pharmacist with [facility], to review new psychotropic medication regimen and to ensure compliance. Secure chat message sent to Primary Care Physician, Dr. Peggye Pitt to request completion of an FL-2 Form for placement purposes. Please keep follow-up appointment with Primary Care Physician, Dr. Peggye Pitt, scheduled for 03/08/2021. LCSW will work with your daughter, Baxter Hire [place] to pursue long-term memory care assisted living placement for you in [place] ([place]), [place]. LCSW e-mailed your daughter, Baxter Hire [place] ([email or web address]) a list of long-term memory care assisted living facilities in [place], [place].          Patient verbalizes understanding of  instructions provided today and agrees to view in MyChart.   Telephone follow-up appointment with care management team member scheduled for:  03/18/2021 at 10:00am.  Danford Bad LCSW Licensed Clinical Social Worker [facility] [phone]

## 2021-03-08 ENCOUNTER — Ambulatory Visit: Payer: Medicare PPO | Admitting: Internal Medicine

## 2021-03-08 ENCOUNTER — Encounter: Payer: Self-pay | Admitting: Internal Medicine

## 2021-03-08 ENCOUNTER — Other Ambulatory Visit: Payer: Self-pay

## 2021-03-08 VITALS — BP 124/80 | HR 50 | Temp 97.7°F | Wt 128.5 lb

## 2021-03-08 DIAGNOSIS — F339 Major depressive disorder, recurrent, unspecified: Secondary | ICD-10-CM

## 2021-03-08 DIAGNOSIS — E559 Vitamin D deficiency, unspecified: Secondary | ICD-10-CM

## 2021-03-08 DIAGNOSIS — Z09 Encounter for follow-up examination after completed treatment for conditions other than malignant neoplasm: Secondary | ICD-10-CM | POA: Diagnosis not present

## 2021-03-08 DIAGNOSIS — I10 Essential (primary) hypertension: Secondary | ICD-10-CM | POA: Diagnosis not present

## 2021-03-08 DIAGNOSIS — E538 Deficiency of other specified B group vitamins: Secondary | ICD-10-CM

## 2021-03-08 DIAGNOSIS — G319 Degenerative disease of nervous system, unspecified: Secondary | ICD-10-CM | POA: Diagnosis not present

## 2021-03-08 NOTE — Addendum Note (Signed)
Addended by: Kern Reap B on: 03/08/2021 12:02 PM   Modules accepted: Orders

## 2021-03-08 NOTE — Progress Notes (Signed)
Established Patient Office Visit     This visit occurred during the SARS-CoV-2 public health emergency.  Safety protocols were in place, including screening questions prior to the visit, additional usage of staff PPE, and extensive cleaning of exam room while observing appropriate contact time as indicated for disinfecting solutions.    CC/Reason for Visit: Hospital discharge follow-up  HPI: Madison Glover is a 70 y.o. female who is coming in today for the above mentioned reasons. Past Medical History is significant for: Dementia/cognitive disorder followed by neurology and neuropsychology, hypertension, hyperlipidemia, osteoporosis, vitamin D and B12 deficiencies.  She is here today with her friend [name] today.  She was sent to the emergency department by behavioral health due to necessity for inpatient psychiatric care.  She was subsequently transferred to a geriatric psychiatric facility.  She was just discharged 2 days ago.  She repeats time and time again that the place was great, the food was great, playing cards was fantastic.  She no longer feels depressed anymore.  "I feel fantastic to be alive".  She has been seeing her chronic care management team.  There is apparently a plan for assisted living care/memory care possibly in [place] where her daughter lives.   Past Medical/Surgical History: Past Medical History:  Diagnosis Date   Allergy    Anxiety    High cholesterol    Hypertension    Memory loss    Osteoporosis    Vitamin D deficiency     Past Surgical History:  Procedure Laterality Date   COLONOSCOPY WITH PROPOFOL N/A 10/19/2020   Procedure: COLONOSCOPY WITH PROPOFOL;  Surgeon: Rachael Fee, MD;  Location: [facility] ENDOSCOPY;  Service: Endoscopy;  Laterality: N/A;   LAPAROSCOPY     POLYPECTOMY  10/19/2020   Procedure: POLYPECTOMY;  Surgeon: Rachael Fee, MD;  Location: [facility] ENDOSCOPY;  Service: Endoscopy;;   TONSILLECTOMY      Social History:   reports that she has quit smoking. Her smoking use included cigarettes. She has been exposed to tobacco smoke. She has never used smokeless tobacco. She reports that she does not currently use alcohol. She reports that she does not use drugs.  Allergies: Allergies  Allergen Reactions   Bee Venom Swelling    Family History:  Family History  Problem Relation Age of Onset   Breast cancer Mother    COPD Father    Heart disease Father    Hypertension Father    Cancer Brother        type unknown   Colon cancer Neg Hx    Esophageal cancer Neg Hx    Stomach cancer Neg Hx    Rectal cancer Neg Hx      Current Outpatient Medications:    alendronate (FOSAMAX) 70 MG tablet, TAKE 1 TABLET BY MOUTH EVERY 7 DAYS WITH A FULL GLASS OF WATER ON AN EMPTY STOMACH. (Patient taking differently: Take 70 mg by mouth once a week.), Disp: 12 tablet, Rfl: 1   citalopram (CELEXA) 40 MG tablet, Take 40 mg by mouth daily., Disp: , Rfl:    donepezil (ARICEPT) 10 MG tablet, Take 1 tablet (10 mg total) by mouth at bedtime., Disp: 90 tablet, Rfl: 3   hydrochlorothiazide (HYDRODIURIL) 25 MG tablet, TAKE 1 TABLET BY MOUTH EVERY DAY (Patient taking differently: Take 25 mg by mouth daily.), Disp: 90 tablet, Rfl: 1   LOW-DOSE ASPIRIN PO, Take 81 mg by mouth daily. Take 1 tablet daily, Disp: , Rfl:  memantine (NAMENDA) 10 MG tablet, Take 1 tablet (10 mg total) by mouth 2 (two) times daily., Disp: 180 tablet, Rfl: 3   metoprolol succinate (TOPROL-XL) 100 MG 24 hr tablet, TAKE 1 TABLET BY MOUTH EVERY DAY, Disp: 90 tablet, Rfl: 1   Metoprolol Succinate 100 MG CS24, Take 100 mg by mouth daily., Disp: 90 capsule, Rfl: 1   mupirocin ointment (BACTROBAN) 2 %, Place 1 application into the nose 2 (two) times daily., Disp: 22 g, Rfl: 0   simvastatin (ZOCOR) 20 MG tablet, TAKE 1 TABLET (20 MG TOTAL) BY MOUTH DAILY AT 6 PM., Disp: 90 tablet, Rfl: 1   vitamin B-12 (CYANOCOBALAMIN) 1000 MCG tablet, Take 1,000 mcg by mouth daily.,  Disp: , Rfl:   Review of Systems:  Constitutional: Denies fever, chills, diaphoresis, appetite change and fatigue.  HEENT: Denies photophobia, eye pain, redness, hearing loss, ear pain, congestion, sore throat, rhinorrhea, sneezing, mouth sores, trouble swallowing, neck pain, neck stiffness and tinnitus.   Respiratory: Denies SOB, DOE, cough, chest tightness,  and wheezing.   Cardiovascular: Denies chest pain, palpitations and leg swelling.  Gastrointestinal: Denies nausea, vomiting, abdominal pain, diarrhea, constipation, blood in stool and abdominal distention.  Genitourinary: Denies dysuria, urgency, frequency, hematuria, flank pain and difficulty urinating.  Endocrine: Denies: hot or cold intolerance, sweats, changes in hair or nails, polyuria, polydipsia. Musculoskeletal: Denies myalgias, back pain, joint swelling, arthralgias and gait problem.  Skin: Denies pallor, rash and wound.  Neurological: Denies dizziness, seizures, syncope, weakness, light-headedness, numbness and headaches.  Hematological: Denies adenopathy. Easy bruising, personal or family bleeding history  Psychiatric/Behavioral: Denies suicidal ideation, mood changes, confusion, nervousness, sleep disturbance and agitation    Physical Exam: Vitals:   03/08/21 0939  BP: 124/80  Pulse: (!) 50  Temp: 97.7 F (36.5 C)  TempSrc: Oral  SpO2: 99%  Weight: 128 lb 8 oz (58.3 kg)    Body mass index is 27.81 kg/m.   Constitutional: NAD, calm, comfortable Eyes: PERRL, lids and conjunctivae normal, wears corrective lenses ENMT: Mucous membranes are moist.  Respiratory: clear to auscultation bilaterally, no wheezing, no crackles. Normal respiratory effort. No accessory muscle use.  Cardiovascular: Regular rate and rhythm, no murmurs / rubs / gallops. No extremity edema. Neurologic: Grossly intact and nonfocal Psychiatric: Impaired insight and judgment, she is alert and oriented x2   Impression and Plan:  Hospital  discharge follow-up  Primary hypertension  Vitamin B12 deficiency  Vitamin D deficiency  Depression, recurrent (HCC)  Neurodegenerative cognitive impairment [facility])  -Hospital charts have been reviewed in great detail. -I do not have any charts from her geriatric psych placement. -Sharion Settler tells me that the only medication change was her Celexa dose was increased from 20 to 40 mg. -She seems much more jovial than last time I saw her, she admits to being a "social butterfly", and believes that the social interaction during her inpatient psychiatric placement was beneficial to her. -I will reach out to CCM team to see what assistance they need from me to get her placed.  Time spent: 32 minutes reviewing chart, interviewing patient and friend, examining patient and formulating plan of care.     [name], MD [facility]

## 2021-03-11 ENCOUNTER — Telehealth: Payer: Self-pay | Admitting: Pharmacist

## 2021-03-11 NOTE — Chronic Care Management (AMB) (Signed)
Chronic Care Management Pharmacy Assistant   Name: Madison Glover  MRN: [id number] DOB: September 06, 1950  Reason for Encounter: Chart review for initial visit with Gaylord Shih Clinical Pharmacist on 03-12-2021 1:30 in office.   Conditions to be addressed/monitored: HTN, HLD, Depression, and Osteoporosis  Recent office visits:  03-08-2021 Philip Aspen, Limmie Patricia, MD - Patient presented for Hospital discharge follow-up. Changed Citalopram to 40 mg daily.  03-07-2021 [name], [name], LCSW - Patient presented for Dementia, Hypertension, Recurrent Major Depressive Disorder - Moderate, Neurodegenerative Cognitive Impairment, Suicidal Ideation and Memory Loss. No medication changes.   02-27-2021 [name], [name], LCSW - Patient presented for Dementia, Hypertension, Recurrent Major Depressive Disorder - Moderate, Neurodegenerative Cognitive Impairment, Suicidal Ideation and Memory Loss. No medication changes.   02-18-2021 Yetta Glassman, RN - Patient presented for Initial Outreach Chronic Care Management and coordination needs. No medication changes.  02-01-2021 Philip Aspen, Limmie Patricia, MD - Patient presented for Neurodegenerative cognitive impairment and other concerns. No medication changes.  Recent consult visits:  11-26-2020 Philip Aspen, Limmie Patricia, MD [facility] - Patient presented for mammogram. No medication changes.  11-06-2020 Glean Salvo, NP (Neurology) - Patient presented for memory loss and other concerns. No medication changes.  10-09-2020 Horton Finer (Psychology) - Patient presented for Major neurocognitive disorder, due to multiple etiologies, without behavioral disturbance and other concerns. No medication changes.  10-02-2020 Horton Finer (Psychology) - Patient presented for Major neurocognitive disorder, due to multiple etiologies, without behavioral disturbance and other concerns. No medication changes.   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 No medication changes.  Hospital visits:  Medication Reconciliation was completed by comparing discharge summary, patient's EMR and Pharmacy list, and upon discussion with patient.  Presented to [facility] ED on  02-21-2021 due to Dysthymia and Current moderate episode of major depressive disorder. Discharge date was 02-23-2021.   New?Medications Started at [facility] Discharge:?? -started None   Medication Changes at Hospital Discharge: -Changed  None  Medications Discontinued at Hospital Discharge: -Stopped  None  Medications that remain the same after Hospital Discharge:??  -All other medications will remain the same.     Hospital visits:  Medication Reconciliation was completed by comparing discharge summary, patient's EMR and Pharmacy list, and upon discussion with patient.  Presented to [facility] ED on  02-20-2021 due to episode of major depressive disorder. Patient was present for 1 hour.  New?Medications Started at [facility] Discharge:?? -started None   Medication Changes at Hospital Discharge: -Changed  None  Medications Discontinued at Hospital Discharge: -Stopped  None  Medications that remain the same after Hospital Discharge:??  -All other medications will remain the same.    Medication Reconciliation was completed by comparing discharge summary, patient's EMR and Pharmacy list, and upon discussion with patient.  Presented to [facility]  on 10-17-2020 for colonoscopy and positive colorectal cancer screening using Cologaurd   Medications Started at [facility] Discharge:?? -started None   Medication Changes at Hospital Discharge: -Changed  None  Medications Discontinued at Hospital Discharge: -Stopped  None  Medications that remain the same  after Hospital Discharge:??  -All other medications will remain the same.    Medications: Outpatient Encounter Medications as of 03/11/2021  Medication Sig   alendronate (FOSAMAX) 70 MG tablet TAKE 1 TABLET BY MOUTH EVERY 7 DAYS WITH A FULL GLASS OF  WATER ON AN EMPTY STOMACH. (Patient taking differently: Take 70 mg by mouth once a week.)   citalopram (CELEXA) 40 MG tablet Take 40 mg by mouth daily.   donepezil (ARICEPT) 10 MG tablet Take 1 tablet (10 mg total) by mouth at bedtime.   hydrochlorothiazide (HYDRODIURIL) 25 MG tablet TAKE 1 TABLET BY MOUTH EVERY DAY (Patient taking differently: Take 25 mg by mouth daily.)   LOW-DOSE ASPIRIN PO Take 81 mg by mouth daily. Take 1 tablet daily   memantine (NAMENDA) 10 MG tablet Take 1 tablet (10 mg total) by mouth 2 (two) times daily.   metoprolol succinate (TOPROL-XL) 100 MG 24 hr tablet TAKE 1 TABLET BY MOUTH EVERY DAY   mupirocin ointment (BACTROBAN) 2 % Place 1 application into the nose 2 (two) times daily.   simvastatin (ZOCOR) 20 MG tablet TAKE 1 TABLET (20 MG TOTAL) BY MOUTH DAILY AT 6 PM.   vitamin B-12 (CYANOCOBALAMIN) 1000 MCG tablet Take 1,000 mcg by mouth daily.   No facility-administered encounter medications on file as of 03/11/2021.   Spoke with patients friend [name] for call   Have you seen any other providers since your last visit? She reports she has been seen for behavioral health issues but otherwise no.  Any changes in your medications or health? She reports her citalopram was increased to 40 mg and has stopped low dose Asprin.  Any side effects from any medications? She reports no.  Do you have an symptoms or problems not managed by your medications? She reports not that she can tell.  Any concerns about your health right now? She reports she was prior to her stay at the facility very down and depressed and didn't like her life, repots she seems to be speaking more positively and has been openly talking about considering living  near her daughter in [place] in an ALF  Has your provider asked that you check blood pressure, blood sugar, or follow special diet at home? She reports she is to be taking it and was purchased a blood pressure cuff but she lives alone and was not there to verify if there were any readings on the machine.  Do you get any type of exercise on a regular basis? She reports she enjoys walking and shopping [place] but has only been walking in the neighborhood since coming back home.   Do you have any problems getting your medications? [name] reports she and other friend [name] go and pick up her medications for her and put them in the pill box, she reports patient has a history of not taking the medications and hiding them around the house, she reports they call her and remind her to take while they are on the phone with her.  Is there anything that you would like to discuss during the appointment? [name] repports patient is very friendly and will and has given out personal information such as address to strangers and is concerned about the dangers there as she is living alone. She also reports patient was given a survey and was basing her answers off of her time prior to her stay and was upset with the results stating she needed to seek immediate support. [name] also reports if patient is scheduled any type of appointment or call to leave a message with patient at least 1 day prior or with [name] or other friend [name] so they may coordinate and make sure patient is available.   She is aware  to  bring medications and supplements to appointment she will also bring patients blood pressure cuff for readings.    Fill History :  ALENDRONATE SODIUM 70 MG TAB 01/15/2021 84   CITALOPRAM HBR 20 MG TABLET 04/19/2020 90   donepezil 10 mg tablet 02/21/2021 90   MEMANTINE HCL 10 MG TABLET 01/07/2021 90   METOPROLOL SUCC ER 100 MG TAB 12/28/2020 90   MUPIROCIN 2% OINTMENT 10/19/2020 10    SIMVASTATIN 20 MG TABLET 01/20/2021 90   HYDROCHLOROTHIAZIDE 25 MG TAB 10/29/2020 90   Care Gaps: Hepatitis C Screening - Overdue COVID Booster #4 [employer]) - Overdue Flu Vaccine - Overdue AWV- MSG sent to Carrie Mew CMA to schedule.  Star Rating Drugs: Simvastatin (Zocor) 20 mg - Last filled 01-20-2021 90 DS at [facility]  Notes:   Attempted to reach on 03-11-2021 left VM 3:40 pm tried cell voice mail not set up, left 2nd msg will notft CPP  Pamala Duffel CMA Clinical Pharmacist Assistant [phone]

## 2021-03-12 ENCOUNTER — Ambulatory Visit: Payer: Medicare PPO | Admitting: Pharmacist

## 2021-03-12 ENCOUNTER — Other Ambulatory Visit: Payer: Self-pay

## 2021-03-12 DIAGNOSIS — M810 Age-related osteoporosis without current pathological fracture: Secondary | ICD-10-CM

## 2021-03-12 DIAGNOSIS — I10 Essential (primary) hypertension: Secondary | ICD-10-CM

## 2021-03-12 NOTE — Patient Instructions (Addendum)
Hi Madison Glover,  It was great to get to meet you in person! Below is a summary of some of the topics we discussed.   Please reach out to me if you have any questions or need anything before our follow up!  Best, Maddie  Gaylord Shih, PharmD, [facility] Clinical Pharmacist [facility] [place] [phone]   Visit Information   Goals Addressed   None    Patient Care Plan: RNCM:Dementia (Adult)     Problem Identified: Potential of Harm or Injury related to dementia   Priority: High     Long-Range Goal: Harm or Injury Prevented related to dementia   Start Date: 02/18/2021  Expected End Date: 07/28/2021  This Visit's Progress: On track  Recent Progress: On track  Priority: High  Note:   Current Barriers:  Ineffective Self Health Maintenance in a patient with Dementia Unable to independently self manage dementia Unable to self administer medications as prescribed Unable to perform IADLs independently Spoke with HCPOAs Madison Cavalier and Madison Scale friends.  States that pt is starting to say she does not want to move since she has been home for a little while from the inpatient geropsychiatric facility. States they are not sure if pt is eating or drinking as much as she should.  States that it has helped with her medications by moving her bedtime pill to take with her other evening pills.  STates that pt has a paid caregiver come in on Saturdays for 3 hours.  Clinical Goal(s):  Collaboration with Madison Glover, Madison Patricia, MD regarding development and update of comprehensive plan of care as evidenced by provider attestation and co-signature Inter-disciplinary care team collaboration (see longitudinal plan of care) patient will work with care management team to address care coordination and chronic disease management needs related to Disease Management Educational Needs Care Coordination Medication Management and Education Psychosocial Support Caregiver Stress  support Dementia and Caregiver Support Level of Care Concerns   Interventions:  Evaluation of current treatment plan related to HTN, HLD, and Dementia, Level of care concerns, ADL IADL limitations, Family and relationship dysfunction, Cognitive Deficits, Memory Deficits, Inability to perform IADL's independently, and Lacks knowledge of community resource: dementia  self-management and patient's adherence to plan as established by provider. Collaboration with Madison Glover, Madison Patricia, MD regarding development and update of comprehensive plan of care as evidenced by provider attestation       and co-signature Inter-disciplinary care team collaboration (see longitudinal plan of care) Discussed plans with patient for ongoing care management follow up and provided patient with direct contact information for care management team Referred to CCM social works for level of care issues, caregiver stress and resources-LCSW working with pt/caregiver to find assisted living facility in [facility] near daughter Referred to CCM pharmacist for medication adherence issues, possible simplification of medication regimen, and pill packaging-referral completed Reinforced home safety issues with caregivers   Revewed importance of good nutrition and getting enough fluids  Self Care Activities:  Self administers medications as prescribed Attends all scheduled provider appointments Calls pharmacy for medication refills Attends church or other social activities Calls provider office for new concerns or questions Patient Goals: - attend dementia support group - check out dementia website - check out other places when staying at home is no longer possible (assisted living center, nursing home) - check out services like in-home help or adult day care - check with a financial planner - connect with the local or national dementia organization - hold a family meeting  with family members and loved ones - list the symptoms that  would make staying at home too hard - make a list of future care and financial needs - make a list of people who can help and what they can do Follow Up Plan: Telephone follow up appointment with care management team member scheduled for: 04/26/21 at 11:30 AM The patient has been provided with contact information for the care management team and has been advised to call with any health related questions or concerns.      Patient Care Plan: RNCM:Cardiovascular disease (HTN and HLD)     Problem Identified: Lack of long term Cardiovascular disease Management (HTN and HLD)   Priority: Medium     Long-Range Goal: Effective Cardiovascular disease Self Management (HTN and HLD)   Start Date: 02/18/2021  Expected End Date: 07/28/2021  This Visit's Progress: On track  Recent Progress: On track  Priority: Medium  Note:   Current Barriers:  Knowledge Deficits related to basic understanding of Cardiovascular disease (HTN and HLD) pathophysiology and self care management Unable to independently Self manage Cardiovascular disease (HTN and HLD) Unable to self administer medications as prescribed Unable to perform IADLs independently Spoke with HCPOAs Madison Glover and Madison Glover friends.Caregiver states that pt does not check B/P at home.  Caregiver(HCPOA )states that she checked pt B/P yesterday and it was 100/80 Nurse Case Manager Clinical Goal(s):  patient will verbalize understanding of plan for hypertension management patient will attend all scheduled medical appointments:CCM LCSW 03/25/21, Dr. Ardyth HarpsHernandez 06/06/21  patient will demonstrate improved adherence to prescribed treatment plan for hypertension as evidenced by taking all medications as prescribed, monitoring and recording blood pressure as directed, adhering to low sodium/DASH diet patient will demonstrate improved health management independence as evidenced by checking blood pressure as directed and notifying PCP if SBP>160 or DBP > 90,  taking all medications as prescribe, and adhering to a low sodium diet as discussed. patient will verbalize basic understanding of hypertension disease process and self health management plan as evidenced by readings within limits and adherence to medications  Interventions:  Collaboration with Madison Glover, Madison PatriciaEstela Y, MD regarding development and update of comprehensive plan of care as evidenced by provider attestation and co-signature Inter-disciplinary care team collaboration (see longitudinal plan of care) Evaluation of current treatment plan related to hypertension self management and patient's adherence to plan as established by provider. Provided education to patient re: stroke prevention, s/s of heart attack and stroke, DASH diet, complications of uncontrolled blood pressure Reviewed medications with patient and discussed importance of compliance Discussed plans with patient for ongoing care management follow up and provided patient with direct contact information for care management team Reviewed with caregiver to monitor blood pressure 1-2 times a week and record, calling PCP for findings outside established parameters.  Reviewed scheduled/upcoming provider appointments including: CCM LCSW 03/25/21, Dr. Ardyth HarpsHernandez 06/06/21 Reinforced to contact pts insurance company to order B/P monitor from over the counter catalog  Referred to CCM social works for level of care issues, caregiver stress and resources-CCM LCSW working on assisted living placement Referred to CCM pharmacist for medication adherence issues, possible simplification of medication regimen, and pill packaging-referral completed Reviewed s/sx of hypotension to notify provider and the importance of drinking adequate amounts of fluids Self-Care Activities:  Self administers medications as prescribed Attends all scheduled provider appointments Calls pharmacy for medication refills Attends church or other social activities Calls  provider office for new concerns or questions Patient Goals  - Self  administer medications as prescribed  - Attend all scheduled provider appointments  - Call provider office for new concerns, questions, or BP outside discussed parameters  - Check BP and record as discussed  - Follows a low sodium diet/DASH diet - check blood pressure weekly - choose a place to take my blood pressure (home, clinic or office, retail store) - write blood pressure results in a log or diary - ask questions to understand Follow Up Plan: Telephone follow up appointment with care management team member scheduled for: 04/26/21 at 11:30 AM The patient has been provided with contact information for the care management team and has been advised to call with any health related questions or concerns.      Patient Care Plan: LCSW Plan of Care     Problem Identified: Reduce and Manage My Symptoms of Depression.   Priority: High     Long-Range Goal: Reduce and Manage My Symptoms of Depression.   Start Date: 02/27/2021  Expected End Date: 04/29/2021  This Visit's Progress: On track  Recent Progress: On track  Priority: High  Note:   Current Barriers:   Acute Mental Health needs related to Dementia, Recurrent Major Depressive Disorder - Moderate, Suicidal Ideation, Neurodegenerative Cognitive Impairment and Memory Loss.   Mental Health Concerns, Memory Deficits, Limited Access to Caregiver, and Lacks Knowledge of [employer]. Needs Support, Education, and Care Coordination in order to meet unmet mental health needs. Clinical Goal(s):  Patient will work with LCSW to reduce and manage symptoms of Depression and Suicidal Ideation.   Patient will increase knowledge and/or ability of:        Coping Skills, Healthy Habits, Self-Management Skills, Stress Reduction, Home Safety and Utilizing [employer] and Resources.  Clinical Interventions:  Other interventions included:       Solution-Focused Therapy  Performed, Mindfulness Meditation Strategies, Relaxation Techniques and Deep Breathing Exercises Encouraged, Active Listening/       Reflection Utilized, Emotional Support Provided, Problem Solving /Task Center Solutions Developed, Psychoeducation /Health Education, Motivational        Interviewing, Brief Cognitive Behavioral Therapy Initiated, Reviewed Mental Health Medications and Discussed Compliance, Quality of Sleep Assessed and       Sleep Hygiene Techniques Promoted, Support Group Participation Encouraged, Increase Level of Activity/Exercise, Verbalization of Feelings Encouraged,        Crisis Resource Education/Information Provided, Suicidal Ideation/Homicidal Ideation Assessed. Discussed plans with patient for ongoing care management follow-up and provided patient and friends with direct contact information for care management team. Collaborated with Primary Care Physician, Dr. [name] regarding development and update of comprehensive plan of care as evidenced by provider attestation and co-signature. Inter-disciplinary care team collaboration (see longitudinal plan of care). Patient Goals/Self-Care Activities: Friends/Dual HCPOA's, Madison Glover and Madison Scale will continue to assist you in engaging with [facility] for ongoing mental health counseling and supportive services.    Keep weekly counseling sessions with Madison Register, LCSW with [facility] [place].   A complete list of long-term memory care assisted living facilities, in and around [place], [place], along with your completed and signed FL-2 Form, have been e-mailed to Dual HCPOA's, Madison Glover ([email or web address]) and Madison Scale ([email or web address]), your brother, Madison Glover ([email or web address]) and your daughter, Madison Glover ([email or web address]), for their review and assistance with placement. LCSW will fax completed and signed FL-2 Form to all long-term memory care assisted living  facilities of interest, once obtained from patient's daughter, Madison Glover. Follow-Up:  03/25/2021 at 10:00am  Patient Care Plan: CCM Pharmacy Care Plan     Problem Identified: Problem: Hypertension, Hyperlipidemia, Depression, Osteoporosis, and Memory loss      Long-Range Goal: Patient-Specific Goal   Start Date: 03/12/2021  Expected End Date: 03/12/2022  This Visit's Progress: On track  Priority: High  Note:   Current Barriers:  Unable to independently monitor therapeutic efficacy Unable to self administer medications as prescribed  Pharmacist Clinical Goal(s):  Patient will achieve adherence to monitoring guidelines and medication adherence to achieve therapeutic efficacy through collaboration with PharmD and provider.   Interventions: 1:1 collaboration with Madison Glover, Madison Patricia, MD regarding development and update of comprehensive plan of care as evidenced by provider attestation and co-signature Inter-disciplinary care team collaboration (see longitudinal plan of care) Comprehensive medication review performed; medication list updated in electronic medical record  Hypertension (BP goal <140/90) -Not ideally controlled -Current treatment: Hydrochlorothiazide 25 mg 1 tablet daily Metoprolol succinate 100 mg 1 tablet daily -Medications previously tried: none  -Current home readings: just purchased a wrist cuff and plans to begin checking -Current dietary habits: TV dinners -Current exercise habits: none -Denies hypotensive/hypertensive symptoms -Educated on BP goals and benefits of medications for prevention of heart attack, stroke and kidney damage; Daily salt intake goal < 2300 mg; Exercise goal of 150 minutes per week; Importance of home blood pressure monitoring; Proper BP monitoring technique; -Counseled to monitor BP at home weekly, document, and provide log at future appointments -Counseled on diet and exercise extensively Recommended to continue  current medication  Hyperlipidemia: (LDL goal < 100) -Uncontrolled -Current treatment: Simvastatin 20 mg 1 tablet daily -Medications previously tried: none  -Current dietary patterns: TV dinners and some fast food -Current exercise habits: none -Educated on Cholesterol goals;  Importance of limiting foods high in cholesterol; Exercise goal of 150 minutes per week; -Counseled on diet and exercise extensively Recommended to continue current medication Recommended repeat lipid panel and consider escalation of statin intensity.  Depression/Anxiety (Goal: minimize symptoms) -Controlled -Current treatment: Citalopram 40 mg 1 tablet daily -Medications previously tried/failed: none -PHQ9: 4 -GAD7: n/a -Educated on Benefits of medication for symptom control Benefits of cognitive-behavioral therapy with or without medication -Recommended to continue current medication  Osteoporosis  (Goal prevent fracutres) -Not ideally controlled -Last DEXA Scan: 04/27/20  T-Score femoral neck: -3.2  T-Score total hip: n/a  T-Score lumbar spine: -2.9  T-Score forearm radius: n/a  10-year probability of major osteoporotic fracture: n/a  10-year probability of hip fracture: n/a -Patient is a candidate for pharmacologic treatment due to T-Score < -2.5 in femoral neck and T-Score < -2.5 in lumbar spine -Current treatment  Alendronate 70 mg 1 tablet weekly -Medications previously tried: none  -Recommend [phone] units of vitamin D daily. Recommend 1200 mg of calcium daily from dietary and supplemental sources. Counseled on oral bisphosphonate administration: take in the morning, 30 minutes prior to food with 6-8 oz of water. Do not lie down for at least 30 minutes after taking. Recommend weight-bearing and muscle strengthening exercises for building and maintaining bone density. -Counseled on diet and exercise extensively Recommended to continue current medication Recommended supplementation with calcium  and vitamin D  Memory loss (Goal: slow progression of memory loss) -Controlled -Current treatment  Donepezil 10 mg daily Memantine 10 mg daily -Medications previously tried: none  -Recommended to continue current medication  Health Maintenance -Vaccine gaps: COVID booster, influenza -Current therapy:  Vitamin B12 1000 mcg daily -Educated on Cost vs benefit of each product must be carefully weighed by  individual consumer -Patient is satisfied with current therapy and denies issues -Recommended to continue current medication  Patient Goals/Self-Care Activities Patient will:  - take medications as prescribed check blood pressure weekly, document, and provide at future appointments target a minimum of 150 minutes of moderate intensity exercise weekly  Follow Up Plan: The care management team will reach out to the patient again over the next 60 days.       Ms. Fetty was given information about Chronic Care Management services today including:  CCM service includes personalized support from designated clinical staff supervised by her physician, including individualized plan of care and coordination with other care providers 24/7 contact phone numbers for assistance for urgent and routine care needs. Standard insurance, coinsurance, copays and deductibles apply for chronic care management only during months in which we provide at least 20 minutes of these services. Most insurances cover these services at 100%, however patients may be responsible for any copay, coinsurance and/or deductible if applicable. This service may help you avoid the need for more expensive face-to-face services. Only one practitioner may furnish and bill the service in a calendar month. The patient may stop CCM services at any time (effective at the end of the month) by phone call to the office staff.  Patient agreed to services and verbal consent obtained.   The patient verbalized understanding of instructions,  educational materials, and care plan provided today and agreed to receive a mailed copy of patient instructions, educational materials, and care plan.  The pharmacy team will reach out to the patient again over the next 60 days.   Verner Chol, [facility]

## 2021-03-12 NOTE — Progress Notes (Signed)
Chronic Care Management Pharmacy Note  03/23/2021 Name:  Madison Glover MRN:  [id number] DOB:  [birth year]  Summary: Pt reports improved compliance and improved mood  Recommendations/Changes made from today's visit: -Recommended taking donepezil and simvastatin with dinner instead of at bedtime to improve compliance -Recommended calcium 600 mg and vitamin D 1000 units daily -Recommended routine BP monitoring at home  Plan: Follow up in 1-2 months for BP assessment   Subjective: Madison Glover is an 70 y.o. year old female who is a primary patient of Isaac Bliss, Rayford Halsted, MD.  The CCM team was consulted for assistance with disease management and care coordination needs.    Engaged with patient face to face for initial visit in response to provider referral for pharmacy case management and/or care coordination services.   Consent to Services:  The patient was given the following information about Chronic Care Management services today, agreed to services, and gave verbal consent: 1. CCM service includes personalized support from designated clinical staff supervised by the primary care provider, including individualized plan of care and coordination with other care providers 2. 24/7 contact phone numbers for assistance for urgent and routine care needs. 3. Service will only be billed when office clinical staff spend 20 minutes or more in a month to coordinate care. 4. Only one practitioner may furnish and bill the service in a calendar month. 5.The patient may stop CCM services at any time (effective at the end of the month) by phone call to the office staff. 6. The patient will be responsible for cost sharing (co-pay) of up to 20% of the service fee (after annual deductible is met). Patient agreed to services and consent obtained.  Patient Care Team: Isaac Bliss, Rayford Halsted, MD as PCP - General (Internal Medicine) Dimitri Ped, RN as Case Manager [name], Maree Erie, LCSW as Social Worker (Licensed Clinical Social Worker) Viona Gilmore, [facility] as Pharmacist (Pharmacist)  Recent office visits: 03-08-2021 Isaac Bliss, Rayford Halsted, MD - Patient presented for Hospital discharge follow-up. Changed Citalopram to 40 mg daily.   03-07-2021 [name], Maree Erie, LCSW - Patient presented for Dementia, Hypertension, Recurrent Major Depressive Disorder - Moderate, Neurodegenerative Cognitive Impairment, Suicidal Ideation and Memory Loss. No medication changes.    02-27-2021 [name], Maree Erie, LCSW - Patient presented for Dementia, Hypertension, Recurrent Major Depressive Disorder - Moderate, Neurodegenerative Cognitive Impairment, Suicidal Ideation and Memory Loss. No medication changes.    02-18-2021 Dimitri Ped, RN - Patient presented for Initial Outreach Chronic Care Management and coordination needs. No medication changes.   02-01-2021 Isaac Bliss, Rayford Halsted, MD - Patient presented for Neurodegenerative cognitive impairment and other concerns. No medication changes.  Recent consult visits: 11-26-2020 Isaac Bliss, Rayford Halsted, MD [facility] - Patient presented for mammogram. No medication changes.   11-06-2020 Suzzanne Cloud, NP (Neurology) - Patient presented for memory loss and other concerns. No medication changes.   10-09-2020 Alfonso Ellis (Psychology) - Patient presented for Major neurocognitive disorder, due to multiple etiologies, without behavioral disturbance and other concerns. No medication changes.   10-02-2020 Alfonso Ellis (Psychology) - Patient presented for Major neurocognitive disorder, due to multiple etiologies, without behavioral disturbance and other concerns. No medication changes.     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 No medication changes.    Hospital  visits: Medication Reconciliation was completed by comparing discharge summary,  patient's EMR and Pharmacy list, and upon discussion with patient.   Presented to [facility] ED on  02-21-2021 due to Dysthymia and Current moderate episode of major depressive disorder. Discharge date was 02-23-2021.    New?Medications Started at [facility] Discharge:?? -started None     Medication Changes at Hospital Discharge: -Changed  None   Medications Discontinued at Hospital Discharge: -Stopped  None   Medications that remain the same after Hospital Discharge:??  -All other medications will remain the same.       Hospital visits:  Medication Reconciliation was completed by comparing discharge summary, patient's EMR and Pharmacy list, and upon discussion with patient.   Presented to [facility] ED on  02-20-2021 due to episode of major depressive disorder. Patient was present for 1 hour.   New?Medications Started at [facility] Discharge:?? -started None     Medication Changes at Hospital Discharge: -Changed  None   Medications Discontinued at Hospital Discharge: -Stopped  None   Medications that remain the same after Hospital Discharge:??  -All other medications will remain the same.     Medication Reconciliation was completed by comparing discharge summary, patient's EMR and Pharmacy list, and upon discussion with patient.   Presented to [facility]  on 10-17-2020 for colonoscopy and positive colorectal cancer screening using Cologaurd    Medications Started at [facility] Discharge:?? -started None     Medication Changes at Hospital Discharge: -Changed  None   Medications Discontinued at Hospital Discharge: -Stopped  None   Medications that remain the same after Hospital Discharge:??  -All other medications will remain the same.     Objective:  Lab Results  Component Value Date   CREATININE 1.37 (H) 02/21/2021   BUN 23  02/21/2021   GFR 57.95 (L) 07/31/2020   GFRNONAA 41 (L) 02/21/2021   NA 134 (L) 02/21/2021   K 3.2 (L) 02/21/2021   CALCIUM 9.1 02/21/2021   CO2 27 02/21/2021   GLUCOSE 108 (H) 02/21/2021    Lab Results  Component Value Date/Time   HGBA1C 5.5 07/31/2020 11:42 AM   GFR 57.95 (L) 07/31/2020 11:42 AM    Last diabetic Eye exam: No results found for: HMDIABEYEEXA  Last diabetic Foot exam: No results found for: HMDIABFOOTEX   Lab Results  Component Value Date   CHOL 230 (H) 07/31/2020   HDL 52.30 07/31/2020   LDLCALC 149 (H) 07/31/2020   TRIG 144.0 07/31/2020   CHOLHDL 4 07/31/2020    Hepatic Function Latest Ref Rng & Units 02/21/2021 07/31/2020  Total Protein 6.5 - 8.1 g/dL 7.2 6.7  Albumin 3.5 - 5.0 g/dL 4.1 4.4  AST 15 - 41 U/L 25 11  ALT 0 - 44 U/L 20 9  Alk Phosphatase 38 - 126 U/L 47 44  Total Bilirubin 0.3 - 1.2 mg/dL 1.4(H) 0.7    Lab Results  Component Value Date/Time   TSH 1.18 07/31/2020 11:42 AM    CBC Latest Ref Rng & Units 02/21/2021 02/20/2021 10/17/2020  WBC 4.0 - 10.5 K/uL 8.6 5.4 10.1  Hemoglobin 12.0 - 15.0 g/dL 14.9 15.3(H) 14.4  Hematocrit 36.0 - 46.0 % 45.7 45.8 45.6  Platelets 150 - 400 K/uL 300 261 389    Lab Results  Component Value Date/Time   VD25OH 27.01 (L) 07/31/2020 11:42 AM    Clinical ASCVD: No  The 10-year ASCVD risk score Mikey Bussing DC Jr., et al., 2013) is: 15.1%   Values used to calculate the score:  Age: 13 years     Sex: Female     Is Non-Hispanic African American: No     Diabetic: No     Tobacco smoker: No     Systolic Blood Pressure: 412 mmHg     Is BP treated: Yes     HDL Cholesterol: 52.3 mg/dL     Total Cholesterol: 230 mg/dL    Depression screen [facility] 2/9 02/18/2021 02/01/2021 07/31/2020  Decreased Interest 1 2 0  Down, Depressed, Hopeless 1 1 0  PHQ - 2 Score 2 3 0  Altered sleeping 1 1 0  Tired, decreased energy 0 0 0  Change in appetite 1 0 0  Feeling bad or failure about yourself  0 - 0  Trouble concentrating 0 0 0   Moving slowly or fidgety/restless 0 0 0  Suicidal thoughts 0 - 0  PHQ-9 Score 4 4 0  Difficult doing work/chores - - Not difficult at all      Social History   Tobacco Use  Smoking Status Former   Years: 4.00   Types: Cigarettes   Passive exposure: Past  Smokeless Tobacco Never  Tobacco Comments   on occasion at parties   BP Readings from Last 3 Encounters:  03/08/21 124/80  02/23/21 (!) 141/81  02/21/21 (!) 152/72   Pulse Readings from Last 3 Encounters:  03/08/21 (!) 50  02/23/21 84  02/21/21 83   Wt Readings from Last 3 Encounters:  03/08/21 128 lb 8 oz (58.3 kg)  02/23/21 132 lb 7.9 oz (60.1 kg)  02/01/21 132 lb 9.6 oz (60.1 kg)   BMI Readings from Last 3 Encounters:  03/08/21 27.81 kg/m  02/23/21 28.67 kg/m  02/01/21 28.69 kg/m    Assessment/Interventions: Review of patient past medical history, allergies, medications, health status, including review of consultants reports, laboratory and other test data, was performed as part of comprehensive evaluation and provision of chronic care management services.   SDOH:  (Social Determinants of Health) assessments and interventions performed: Yes SDOH Interventions    Flowsheet Row Most Recent Value  SDOH Interventions   Financial Strain Interventions Intervention Not Indicated  Transportation Interventions Intervention Not Indicated      SDOH Screenings   Alcohol Screen: Low Risk    Last Alcohol Screening Score (AUDIT): 1  Depression (PHQ2-9): Low Risk    PHQ-2 Score: 4  Financial Resource Strain: Low Risk    Difficulty of Paying Living Expenses: Not hard at all  Food Insecurity: No Food Insecurity   Worried About Charity fundraiser in the Last Year: Never true   Ran Out of Food in the Last Year: Never true  Housing: Low Risk    Last Housing Risk Score: 0  Physical Activity: Inactive   Days of Exercise per Week: 0 days   Minutes of Exercise per Session: 0 min  Social Connections: Moderately  Isolated   Frequency of Communication with Friends and Family: More than three times a week   Frequency of Social Gatherings with Friends and Family: Once a week   Attends Religious Services: 1 to 4 times per year   Active Member of [employer] or Organizations: No   Attends Archivist Meetings: Never   Marital Status: Divorced  Stress: Stress Concern Present   Feeling of Stress : Very much  Tobacco Use: Medium Risk   Smoking Tobacco Use: Former   Smokeless Tobacco Use: Never  Transportation Needs: No Data processing manager (Medical): No  Lack of Transportation (Non-Medical): No    Patient presented to office visit with her friend Letta Median, who helps with her medication management. She has known Letta Median for a long time and Sonia Baller used to work with [employer] sister as a Pharmacist, hospital and that's how they met. Her and friend Tamela Oddi, go and fill pillbox and sometimes call to remind her to take medications. Patient reports she is in a much better place after being in the facility and feels like a new woman.  Patient does not cook and eats a lot of microwaved food. Sometime she will eat yogurt but mostly TV dinners and ready to prepare salads. She also eats fast food some times and enjoys [place]ie  Allergies  Allergen Reactions   Bee Venom Swelling    Medications Reviewed Today     Reviewed by Viona Gilmore, [facility] (Pharmacist) on 03/23/21 at 33  Med List Status: <None>   Medication Order Taking? Sig Documenting Provider Last Dose Status Informant  alendronate (FOSAMAX) 70 MG tablet [id number] Yes TAKE 1 TABLET BY MOUTH EVERY 7 DAYS WITH A FULL GLASS OF WATER ON AN EMPTY STOMACH.  Patient taking differently: Take 70 mg by mouth once a week.   Isaac Bliss, Rayford Halsted, MD Taking Active   citalopram (CELEXA) 40 MG tablet [id number]  Take 40 mg by mouth daily. [provider]  Active   donepezil (ARICEPT) 10 MG tablet [id number]  Take 1 tablet (10  mg total) by mouth at bedtime. Suzzanne Cloud, NP  Active Friend  hydrochlorothiazide (HYDRODIURIL) 25 MG tablet [id number]  TAKE 1 TABLET BY MOUTH EVERY DAY  Patient taking differently: Take 25 mg by mouth daily.   Isaac Bliss, Rayford Halsted, MD  Active   LOW-DOSE ASPIRIN PO [id number]  Take 81 mg by mouth daily. Take 1 tablet daily [provider]  Active Friend  memantine (NAMENDA) 10 MG tablet [id number]  Take 1 tablet (10 mg total) by mouth 2 (two) times daily. Suzzanne Cloud, NP  Active Friend  metoprolol succinate (TOPROL-XL) 100 MG 24 hr tablet [id number]  TAKE 1 TABLET BY MOUTH EVERY DAY Isaac Bliss, Rayford Halsted, MD  Active Friend  mupirocin ointment (BACTROBAN) 2 % [id number]  Place 1 application into the nose 2 (two) times daily. Georgette Shell, MD  Active Friend  simvastatin (ZOCOR) 20 MG tablet [id number] Yes TAKE 1 TABLET (20 MG TOTAL) BY MOUTH DAILY AT 6 PM. Isaac Bliss, Rayford Halsted, MD Taking Active Friend  vitamin B-12 (CYANOCOBALAMIN) 1000 MCG tablet [id number]  Take 1,000 mcg by mouth daily. [provider]  Active Friend            Patient Active Problem List   Diagnosis Date Noted   MDD (major depressive disorder), recurrent episode, moderate ([place])    Suicidal ideation    Neurodegenerative cognitive impairment ([place]) 11/06/2020   Positive colorectal cancer screening using Cologuard test    Encounter for screening colonoscopy 10/17/2020   Vitamin D deficiency 07/31/2020   Osteoporosis 05/02/2020   Hypertension    High cholesterol    Depression, recurrent ([place])    Memory loss 08/02/2019   Vitamin B12 deficiency 08/02/2019    Immunization History  Administered Date(s) Administered   Fluad Quad(high Dose 65+) 04/17/2020   PFIZER(Purple Top)SARS-COV-2 Vaccination 09/26/2019, 10/25/2019, 06/01/2020   Pneumococcal Polysaccharide-23 04/17/2020    Conditions to be addressed/monitored:  Hypertension, Hyperlipidemia, Depression, Osteoporosis, and  Memory loss  Care Plan : [place]  Updates made by Viona Gilmore, [place] since 03/23/2021 12:00 AM     Problem: Problem: Hypertension, Hyperlipidemia, Depression, Osteoporosis, and Memory loss      Long-Range Goal: Patient-Specific Goal   Start Date: 03/12/2021  Expected End Date: 03/12/2022  This Visit's Progress: On track  Priority: High  Note:   Current Barriers:  Unable to independently monitor therapeutic efficacy Unable to self administer medications as prescribed  Pharmacist Clinical Goal(s):  Patient will achieve adherence to monitoring guidelines and medication adherence to achieve therapeutic efficacy through collaboration with PharmD and provider.   Interventions: 1:1 collaboration with Isaac Bliss, Rayford Halsted, MD regarding development and update of comprehensive plan of care as evidenced by provider attestation and co-signature Inter-disciplinary care team collaboration (see longitudinal plan of care) Comprehensive medication review performed; medication list updated in electronic medical record  Hypertension (BP goal <140/90) -Not ideally controlled -Current treatment: Hydrochlorothiazide 25 mg 1 tablet daily Metoprolol succinate 100 mg 1 tablet daily -Medications previously tried: none  -Current home readings: just purchased a wrist cuff and plans to begin checking -Current dietary habits: TV dinners -Current exercise habits: none -Denies hypotensive/hypertensive symptoms -Educated on BP goals and benefits of medications for prevention of heart attack, stroke and kidney damage; Daily salt intake goal < 2300 mg; Exercise goal of 150 minutes per week; Importance of home blood pressure monitoring; Proper BP monitoring technique; -Counseled to monitor BP at home weekly, document, and provide log at future appointments -Counseled on diet and exercise extensively Recommended to continue current medication  Hyperlipidemia: (LDL goal <  100) -Uncontrolled -Current treatment: Simvastatin 20 mg 1 tablet daily -Medications previously tried: none  -Current dietary patterns: TV dinners and some fast food -Current exercise habits: none -Educated on Cholesterol goals;  Importance of limiting foods high in cholesterol; Exercise goal of 150 minutes per week; -Counseled on diet and exercise extensively Recommended to continue current medication Recommended repeat lipid panel and consider escalation of statin intensity.  Depression/Anxiety (Goal: minimize symptoms) -Controlled -Current treatment: Citalopram 40 mg 1 tablet daily -Medications previously tried/failed: none -PHQ9: 4 -GAD7: n/a -Educated on Benefits of medication for symptom control Benefits of cognitive-behavioral therapy with or without medication -Recommended to continue current medication  Osteoporosis  (Goal prevent fracutres) -Not ideally controlled -Last DEXA Scan: 04/27/20  T-Score femoral neck: -3.2  T-Score total hip: n/a  T-Score lumbar spine: -2.9  T-Score forearm radius: n/a  10-year probability of major osteoporotic fracture: n/a  10-year probability of hip fracture: n/a -Patient is a candidate for pharmacologic treatment due to T-Score < -2.5 in femoral neck and T-Score < -2.5 in lumbar spine -Current treatment  Alendronate 70 mg 1 tablet weekly -Medications previously tried: none  -Recommend [phone] units of vitamin D daily. Recommend 1200 mg of calcium daily from dietary and supplemental sources. Counseled on oral bisphosphonate administration: take in the morning, 30 minutes prior to food with 6-8 oz of water. Do not lie down for at least 30 minutes after taking. Recommend weight-bearing and muscle strengthening exercises for building and maintaining bone density. -Counseled on diet and exercise extensively Recommended to continue current medication Recommended supplementation with calcium and vitamin D  Memory loss (Goal: slow  progression of memory loss) -Controlled -Current treatment  Donepezil 10 mg daily Memantine 10 mg daily -Medications previously tried: none  -Recommended to continue current medication  Health Maintenance -Vaccine gaps: COVID booster, influenza -Current therapy:  Vitamin B12 1000 mcg daily -Educated on Cost vs benefit of each product must be  carefully weighed by individual consumer -Patient is satisfied with current therapy and denies issues -Recommended to continue current medication  Patient Goals/Self-Care Activities Patient will:  - take medications as prescribed check blood pressure weekly, document, and provide at future appointments target a minimum of 150 minutes of moderate intensity exercise weekly  Follow Up Plan: The care management team will reach out to the patient again over the next 60 days.        Medication Assistance: None required.  Patient affirms current coverage meets needs.  Compliance/Adherence/Medication fill history: Care Gaps: Hep C screening, COVID booster, influenza  Star-Rating Drugs: Simvastatin (Zocor) 20 mg - Last [facility]-20-2021 [facility]  [facility]  [id number]: [phone]: [phone] Uses pill box? Yes Pt endorses 80% compliance  We discussed: Benefits of medication synchronization, packaging and delivery as well as enhanced pharmacist oversight with [facility]. Patient decided to: Continue current medication management strategy  Care Plan and Follow Up Patient Decision:  Patient agrees to Care Plan and Follow-up.  Plan: The care management team will reach out to the patient again over the next 60 days.  MJeni Salles PharmD, BPanaceaPharmacist LEphrataat [place]

## 2021-03-18 ENCOUNTER — Ambulatory Visit: Payer: Medicare PPO | Admitting: *Deleted

## 2021-03-18 DIAGNOSIS — M810 Age-related osteoporosis without current pathological fracture: Secondary | ICD-10-CM

## 2021-03-18 DIAGNOSIS — R413 Other amnesia: Secondary | ICD-10-CM

## 2021-03-18 DIAGNOSIS — F331 Major depressive disorder, recurrent, moderate: Secondary | ICD-10-CM

## 2021-03-18 DIAGNOSIS — F339 Major depressive disorder, recurrent, unspecified: Secondary | ICD-10-CM

## 2021-03-18 DIAGNOSIS — I10 Essential (primary) hypertension: Secondary | ICD-10-CM

## 2021-03-18 DIAGNOSIS — R45851 Suicidal ideations: Secondary | ICD-10-CM

## 2021-03-19 NOTE — Patient Instructions (Signed)
Visit Information  PATIENT GOALS:  Goals Addressed             This Visit's Progress    Reduce and Manage My Symptoms of Depression.   On track    Timeframe:  Long-Range Goal Priority:  High Start Date:   02/27/2021                        Expected End Date:  04/29/2021                  Follow-Up Date:  03/25/2021 at 10:00am.  Patient Goals/Self-Care Activities: Friends/Dual HCPOA's, [name] and [name] will continue to assist you in engaging with [facility] for ongoing mental health counseling and supportive services.    Keep weekly counseling sessions with Hoy Register, LCSW with [facility] [place].   A complete list of long-term memory care assisted living facilities, in and around [place], [place], along with your completed and signed FL-2 Form, have been e-mailed to Dual HCPOA's, [name] ([email or web address]) and [name] ([email or web address]), your brother, [name] ([email or web address]) and your daughter, Baxter Hire Cremeans ([email or web address]), for their review and assistance with placement. LCSW will fax completed and signed FL-2 Form to all long-term memory care assisted living facilities of interest, once obtained from patient's daughter, Baxter Hire Nevitt.           Patient verbalizes understanding of instructions provided today and agrees to view in MyChart.   Follow-Up:  03/25/2021 at 10:00am  Danford Bad LCSW Licensed Clinical Social Worker [facility] [phone]

## 2021-03-19 NOTE — Chronic Care Management (AMB) (Signed)
Chronic Care Management    Clinical Social Work Note  03/19/2021 Name: Madison Glover MRN: [id number] DOB: 01/30/1951  Madison Glover is a 70 y.o. year old female who is a primary care patient of Philip Aspen, Limmie Patricia, MD. The CCM team was consulted to assist the patient with chronic disease management and/or care coordination needs related to: Level of Care Concerns and Mental Health Counseling and Resources.   Engaged with patient's HCPOA, Madison Glover by telephone for follow-up visit in response to provider referral for social work chronic care management and care coordination services.   Consent to Services:  The patient was given information about Chronic Care Management services, agreed to services, and gave verbal consent prior to initiation of services.  Please see initial visit note for detailed documentation.   Patient agreed to services and consent obtained.   Assessment: Review of patient past medical history, allergies, medications, and health status, including review of relevant consultants reports was performed today as part of a comprehensive evaluation and provision of chronic care management and care coordination services.     SDOH (Social Determinants of Health) assessments and interventions performed:    Advanced Directives Status: Not addressed in this encounter.  CCM Care Plan  Allergies  Allergen Reactions   Bee Venom Swelling    Outpatient Encounter Medications as of 03/18/2021  Medication Sig   alendronate (FOSAMAX) 70 MG tablet TAKE 1 TABLET BY MOUTH EVERY 7 DAYS WITH A FULL GLASS OF WATER ON AN EMPTY STOMACH. (Patient taking differently: Take 70 mg by mouth once a week.)   citalopram (CELEXA) 40 MG tablet Take 40 mg by mouth daily.   donepezil (ARICEPT) 10 MG tablet Take 1 tablet (10 mg total) by mouth at bedtime.   hydrochlorothiazide (HYDRODIURIL) 25 MG tablet TAKE 1 TABLET BY MOUTH EVERY DAY (Patient taking differently: Take 25 mg by mouth  daily.)   LOW-DOSE ASPIRIN PO Take 81 mg by mouth daily. Take 1 tablet daily   memantine (NAMENDA) 10 MG tablet Take 1 tablet (10 mg total) by mouth 2 (two) times daily.   metoprolol succinate (TOPROL-XL) 100 MG 24 hr tablet TAKE 1 TABLET BY MOUTH EVERY DAY   mupirocin ointment (BACTROBAN) 2 % Place 1 application into the nose 2 (two) times daily.   simvastatin (ZOCOR) 20 MG tablet TAKE 1 TABLET (20 MG TOTAL) BY MOUTH DAILY AT 6 PM.   vitamin B-12 (CYANOCOBALAMIN) 1000 MCG tablet Take 1,000 mcg by mouth daily.   No facility-administered encounter medications on file as of 03/18/2021.    Patient Active Problem List   Diagnosis Date Noted   MDD (major depressive disorder), recurrent episode, moderate (HCC)    Suicidal ideation    Neurodegenerative cognitive impairment (HCC) 11/06/2020   Positive colorectal cancer screening using Cologuard test    Encounter for screening colonoscopy 10/17/2020   Vitamin D deficiency 07/31/2020   Osteoporosis 05/02/2020   Hypertension    High cholesterol    Depression, recurrent (HCC)    Memory loss 08/02/2019   Vitamin B12 deficiency 08/02/2019    Conditions to be addressed/monitored: Anxiety, Depression, and Level of Care Concerns.  ADL/IADL Limitations, Mental Health Concerns, Limited Access to Caregiver, Cognitive Deficits, Memory Deficits, and Lacks Knowledge of [employer].  Care Plan : LCSW Plan of Care  Updates made by [name], LCSW since 03/19/2021 12:00 AM     Problem: Reduce and Manage My Symptoms of Depression.   Priority: High     Long-Range  Goal: Reduce and Manage My Symptoms of Depression.   Start Date: 02/27/2021  Expected End Date: 04/29/2021  This Visit's Progress: On track  Recent Progress: On track  Priority: High  Note:   Current Barriers:   Acute Mental Health needs related to Dementia, Recurrent Major Depressive Disorder - Moderate, Suicidal Ideation, Neurodegenerative Cognitive Impairment and Memory Loss.    Mental Health Concerns, Memory Deficits, Limited Access to Caregiver, and Lacks Knowledge of [employer]. Needs Support, Education, and Care Coordination in order to meet unmet mental health needs. Clinical Goal(s):  Patient will work with LCSW to reduce and manage symptoms of Depression and Suicidal Ideation.   Patient will increase knowledge and/or ability of:        Coping Skills, Healthy Habits, Self-Management Skills, Stress Reduction, Home Safety and Utilizing [employer] and Resources.  Clinical Interventions:  Other interventions included:       Solution-Focused Therapy Performed, Mindfulness Meditation Strategies, Relaxation Techniques and Deep Breathing Exercises Encouraged, Active Listening/       Reflection Utilized, Emotional Support Provided, Problem Solving /Task Center Solutions Developed, Psychoeducation /Health Education, Motivational        Interviewing, Brief Cognitive Behavioral Therapy Initiated, Reviewed Mental Health Medications and Discussed Compliance, Quality of Sleep Assessed and       Sleep Hygiene Techniques Promoted, Support Group Participation Encouraged, Increase Level of Activity/Exercise, Verbalization of Feelings Encouraged,        Crisis Resource Education/Information Provided, Suicidal Ideation/Homicidal Ideation Assessed. Discussed plans with patient for ongoing care management follow-up and provided patient and friends with direct contact information for care management team. Collaborated with Primary Care Physician, Dr. [name] regarding development and update of comprehensive plan of care as evidenced by provider attestation and co-signature. Inter-disciplinary care team collaboration (see longitudinal plan of care). Patient Goals/Self-Care Activities: Friends/Dual HCPOA's, Madison Glover and [name] will continue to assist you in engaging with [facility] for ongoing mental health counseling and supportive services.     Keep weekly counseling sessions with Hoy Register, LCSW with [facility] the [place].   A complete list of long-term memory care assisted living facilities, in and around [place], [place], along with your completed and signed FL-2 Form, have been e-mailed to Dual HCPOA's, Madison Glover ([email or web address]) and [name] ([email or web address]), your brother, [name] ([email or web address]) and your daughter, Baxter Hire Noteboom ([email or web address]), for their review and assistance with placement. LCSW will fax completed and signed FL-2 Form to all long-term memory care assisted living facilities of interest, once obtained from patient's daughter, Baxter Hire Marinello. Follow-Up:  03/25/2021 at 10:00am       Follow-Up Plan:  03/25/2021 at 10:00am      Danford Bad LCSW Licensed Clinical Social Worker [facility] [phone]

## 2021-03-22 ENCOUNTER — Ambulatory Visit: Payer: Medicare PPO

## 2021-03-22 DIAGNOSIS — F339 Major depressive disorder, recurrent, unspecified: Secondary | ICD-10-CM

## 2021-03-22 DIAGNOSIS — I10 Essential (primary) hypertension: Secondary | ICD-10-CM

## 2021-03-22 DIAGNOSIS — E7800 Pure hypercholesterolemia, unspecified: Secondary | ICD-10-CM

## 2021-03-22 DIAGNOSIS — R413 Other amnesia: Secondary | ICD-10-CM

## 2021-03-22 NOTE — Patient Instructions (Signed)
Visit Information  PATIENT GOALS:  Goals Addressed             This Visit's Progress    RNCM:Planning for Long-Term Care-Dementia   On track    Timeframe:  Long-Range Goal Priority:  High Start Date:      02/18/21                       Expected End Date:      07/28/21                 Follow Up Date 04/26/21    - attend dementia support group - check out dementia website - check out other places when staying at home is no longer possible (assisted living center, nursing home) - check out services like in-home help or adult day care - check with a financial planner - connect with the local or national dementia organization - hold a family meeting with family members and loved ones - list the symptoms that would make staying at home too hard - make a list of future care and financial needs - make a list of people who can help and what they can do    Why is this important?   Learning that you or your loved one has dementia can be scary and stressful.  You can reduce stress by planning.  Preparing for the future is one of the most important things to do.  Thinking about how much care you/your loved one will need and how much it will cost is not easy.  Early on, you/your loved one can be part of making decisions for the future.  Making sure that your/your loved one's wishes for care are known is important.     Notes:      RNCM:Track and Manage My Blood Pressure-Hypertension   On track    Timeframe:  Long-Range Goal Priority:  Medium Start Date:        02/18/21                     Expected End Date:     07/28/21                  Follow Up Date 03/3021    - check blood pressure weekly - choose a place to take my blood pressure (home, clinic or office, retail store) - write blood pressure results in a log or diary    Why is this important?   You won't feel high blood pressure, but it can still hurt your blood vessels.  High blood pressure can cause heart or kidney problems. It can  also cause a stroke.  Making lifestyle changes like losing a little weight or eating less salt will help.  Checking your blood pressure at home and at different times of the day can help to control blood pressure.  If the doctor prescribes medicine remember to take it the way the doctor ordered.  Call the office if you cannot afford the medicine or if there are questions about it.     Notes:         Patient verbalizes understanding of instructions provided today and agrees to view in MyChart.   Telephone follow up appointment with care management team member scheduled for: 04/26/21 at 11:30 PM  Dudley Major RN, [facility], CDE Care Management Coordinator [facility] [phone], Mobile [phone]

## 2021-03-22 NOTE — Chronic Care Management (AMB) (Signed)
Chronic Care Management   CCM RN Visit Note  03/22/2021 Name: Madison Glover MRN: [id number] DOB: Sep 18, 1950  Subjective: Madison Glover is a 70 y.o. year old female who is a primary care patient of Philip Aspen, Limmie Patricia, MD. The care management team was consulted for assistance with disease management and care coordination needs.    Engaged with patient by telephone for follow up visit in response to provider referral for case management and/or care coordination services.   Consent to Services:  The patient was given information about Chronic Care Management services, agreed to services, and gave verbal consent prior to initiation of services.  Please see initial visit note for detailed documentation.   Patient agreed to services and verbal consent obtained.   Assessment: Review of patient past medical history, allergies, medications, health status, including review of consultants reports, laboratory and other test data, was performed as part of comprehensive evaluation and provision of chronic care management services.   SDOH (Social Determinants of Health) assessments and interventions performed:    CCM Care Plan  Allergies  Allergen Reactions   Bee Venom Swelling    Outpatient Encounter Medications as of 03/22/2021  Medication Sig   alendronate (FOSAMAX) 70 MG tablet TAKE 1 TABLET BY MOUTH EVERY 7 DAYS WITH A FULL GLASS OF WATER ON AN EMPTY STOMACH. (Patient taking differently: Take 70 mg by mouth once a week.)   citalopram (CELEXA) 40 MG tablet Take 40 mg by mouth daily.   donepezil (ARICEPT) 10 MG tablet Take 1 tablet (10 mg total) by mouth at bedtime.   hydrochlorothiazide (HYDRODIURIL) 25 MG tablet TAKE 1 TABLET BY MOUTH EVERY DAY (Patient taking differently: Take 25 mg by mouth daily.)   LOW-DOSE ASPIRIN PO Take 81 mg by mouth daily. Take 1 tablet daily   memantine (NAMENDA) 10 MG tablet Take 1 tablet (10 mg total) by mouth 2 (two) times daily.   metoprolol  succinate (TOPROL-XL) 100 MG 24 hr tablet TAKE 1 TABLET BY MOUTH EVERY DAY   mupirocin ointment (BACTROBAN) 2 % Place 1 application into the nose 2 (two) times daily.   simvastatin (ZOCOR) 20 MG tablet TAKE 1 TABLET (20 MG TOTAL) BY MOUTH DAILY AT 6 PM.   vitamin B-12 (CYANOCOBALAMIN) 1000 MCG tablet Take 1,000 mcg by mouth daily.   No facility-administered encounter medications on file as of 03/22/2021.    Patient Active Problem List   Diagnosis Date Noted   MDD (major depressive disorder), recurrent episode, moderate (HCC)    Suicidal ideation    Neurodegenerative cognitive impairment (HCC) 11/06/2020   Positive colorectal cancer screening using Cologuard test    Encounter for screening colonoscopy 10/17/2020   Vitamin D deficiency 07/31/2020   Osteoporosis 05/02/2020   Hypertension    High cholesterol    Depression, recurrent (HCC)    Memory loss 08/02/2019   Vitamin B12 deficiency 08/02/2019    Conditions to be addressed/monitored:HTN, HLD, Depression, and Dementia  Care Plan : RNCM:Dementia (Adult)  Updates made by Yetta Glassman, RN since 03/22/2021 12:00 AM     Problem: Potential of Harm or Injury related to dementia   Priority: High     Long-Range Goal: Harm or Injury Prevented related to dementia   Start Date: 02/18/2021  Expected End Date: 07/28/2021  This Visit's Progress: On track  Recent Progress: On track  Priority: High  Note:   Current Barriers:  Ineffective Self Health Maintenance in a patient with Dementia Unable to independently self manage dementia  Unable to self administer medications as prescribed Unable to perform IADLs independently Spoke with HCPOAs Stasia Cavalier and [name] friends.  States that pt is starting to say she does not want to move since she has been home for a little while from the inpatient geropsychiatric facility. States they are not sure if pt is eating or drinking as much as she should.  States that it has helped with her  medications by moving her bedtime pill to take with her other evening pills.  STates that pt has a paid caregiver come in on Saturdays for 3 hours.  Clinical Goal(s):  Collaboration with Philip Aspen, Limmie Patricia, MD regarding development and update of comprehensive plan of care as evidenced by provider attestation and co-signature Inter-disciplinary care team collaboration (see longitudinal plan of care) patient will work with care management team to address care coordination and chronic disease management needs related to Disease Management Educational Needs Care Coordination Medication Management and Education Psychosocial Support Caregiver Stress support Dementia and Caregiver Support Level of Care Concerns   Interventions:  Evaluation of current treatment plan related to HTN, HLD, and Dementia, Level of care concerns, ADL IADL limitations, Family and relationship dysfunction, Cognitive Deficits, Memory Deficits, Inability to perform IADL's independently, and Lacks knowledge of community resource: dementia  self-management and patient's adherence to plan as established by provider. Collaboration with Philip Aspen, Limmie Patricia, MD regarding development and update of comprehensive plan of care as evidenced by provider attestation       and co-signature Inter-disciplinary care team collaboration (see longitudinal plan of care) Discussed plans with patient for ongoing care management follow up and provided patient with direct contact information for care management team Referred to CCM social works for level of care issues, caregiver stress and resources-LCSW working with pt/caregiver to find assisted living facility in [facility] near daughter Referred to CCM pharmacist for medication adherence issues, possible simplification of medication regimen, and pill packaging-referral completed Reinforced home safety issues with caregivers   Revewed importance of good nutrition and getting enough fluids   Self Care Activities:  Self administers medications as prescribed Attends all scheduled provider appointments Calls pharmacy for medication refills Attends church or other social activities Calls provider office for new concerns or questions Patient Goals: - attend dementia support group - check out dementia website - check out other places when staying at home is no longer possible (assisted living center, nursing home) - check out services like in-home help or adult day care - check with a financial planner - connect with the local or national dementia organization - hold a family meeting with family members and loved ones - list the symptoms that would make staying at home too hard - make a list of future care and financial needs - make a list of people who can help and what they can do Follow Up Plan: Telephone follow up appointment with care management team member scheduled for: 04/26/21 at 11:30 AM The patient has been provided with contact information for the care management team and has been advised to call with any health related questions or concerns.      Care Plan : RNCM:Cardiovascular disease (HTN and HLD)  Updates made by Yetta Glassman, RN since 03/22/2021 12:00 AM     Problem: Lack of long term Cardiovascular disease Management (HTN and HLD)   Priority: Medium     Long-Range Goal: Effective Cardiovascular disease Self Management (HTN and HLD)   Start Date: 02/18/2021  Expected End Date:  07/28/2021  This Visit's Progress: On track  Recent Progress: On track  Priority: Medium  Note:   Current Barriers:  Knowledge Deficits related to basic understanding of Cardiovascular disease (HTN and HLD) pathophysiology and self care management Unable to independently Self manage Cardiovascular disease (HTN and HLD) Unable to self administer medications as prescribed Unable to perform IADLs independently Spoke with HCPOAs Stasia Cavalier and [name] friends.Caregiver  states that pt does not check B/P at home.  Caregiver(HCPOA )states that she checked pt B/P yesterday and it was 100/80 Nurse Case Manager Clinical Goal(s):  patient will verbalize understanding of plan for hypertension management patient will attend all scheduled medical appointments:CCM LCSW 03/25/21, Dr. Ardyth Harps 06/06/21  patient will demonstrate improved adherence to prescribed treatment plan for hypertension as evidenced by taking all medications as prescribed, monitoring and recording blood pressure as directed, adhering to low sodium/DASH diet patient will demonstrate improved health management independence as evidenced by checking blood pressure as directed and notifying PCP if SBP>160 or DBP > 90, taking all medications as prescribe, and adhering to a low sodium diet as discussed. patient will verbalize basic understanding of hypertension disease process and self health management plan as evidenced by readings within limits and adherence to medications  Interventions:  Collaboration with Philip Aspen, Limmie Patricia, MD regarding development and update of comprehensive plan of care as evidenced by provider attestation and co-signature Inter-disciplinary care team collaboration (see longitudinal plan of care) Evaluation of current treatment plan related to hypertension self management and patient's adherence to plan as established by provider. Provided education to patient re: stroke prevention, s/s of heart attack and stroke, DASH diet, complications of uncontrolled blood pressure Reviewed medications with patient and discussed importance of compliance Discussed plans with patient for ongoing care management follow up and provided patient with direct contact information for care management team Reviewed with caregiver to monitor blood pressure 1-2 times a week and record, calling PCP for findings outside established parameters.  Reviewed scheduled/upcoming provider appointments including: CCM  LCSW 03/25/21, Dr. Ardyth Harps 06/06/21 Reinforced to contact pts insurance company to order B/P monitor from over the counter catalog  Referred to CCM social works for level of care issues, caregiver stress and resources-CCM LCSW working on assisted living placement Referred to CCM pharmacist for medication adherence issues, possible simplification of medication regimen, and pill packaging-referral completed Reviewed s/sx of hypotension to notify provider and the importance of drinking adequate amounts of fluids Self-Care Activities:  Self administers medications as prescribed Attends all scheduled provider appointments Calls pharmacy for medication refills Attends church or other social activities Calls provider office for new concerns or questions Patient Goals  - Self administer medications as prescribed  - Attend all scheduled provider appointments  - Call provider office for new concerns, questions, or BP outside discussed parameters  - Check BP and record as discussed  - Follows a low sodium diet/DASH diet - check blood pressure weekly - choose a place to take my blood pressure (home, clinic or office, retail store) - write blood pressure results in a log or diary - ask questions to understand Follow Up Plan: Telephone follow up appointment with care management team member scheduled for: 04/26/21 at 11:30 AM The patient has been provided with contact information for the care management team and has been advised to call with any health related questions or concerns.       Plan:Telephone follow up appointment with care management team member scheduled for:  04/26/21 and The patient has  been provided with contact information for the care management team and has been advised to call with any health related questions or concerns.  Peter Garter RN, [facility], CDE Care Management Coordinator [facility] [phone], Mobile [phone]

## 2021-03-25 ENCOUNTER — Telehealth: Payer: Medicare PPO

## 2021-03-26 ENCOUNTER — Telehealth: Payer: Self-pay

## 2021-03-26 ENCOUNTER — Ambulatory Visit: Payer: Medicare PPO | Admitting: *Deleted

## 2021-03-26 DIAGNOSIS — F331 Major depressive disorder, recurrent, moderate: Secondary | ICD-10-CM

## 2021-03-26 DIAGNOSIS — M810 Age-related osteoporosis without current pathological fracture: Secondary | ICD-10-CM

## 2021-03-26 DIAGNOSIS — R413 Other amnesia: Secondary | ICD-10-CM

## 2021-03-26 DIAGNOSIS — I10 Essential (primary) hypertension: Secondary | ICD-10-CM

## 2021-03-26 DIAGNOSIS — G319 Degenerative disease of nervous system, unspecified: Secondary | ICD-10-CM

## 2021-03-26 NOTE — Patient Instructions (Signed)
Visit Information  PATIENT GOALS:  Goals Addressed             This Visit's Progress    Reduce and Manage My Symptoms of Depression.   On track    Timeframe:  Long-Range Goal Priority:  High Start Date:   02/27/2021                        Expected End Date:  04/29/2021                  Follow-Up Date:  04/03/2021 at 1:00pm  Patient Goals/Self-Care Activities: Accept all calls from [facility] to continue to receive ongoing mental health counseling and supportive services. Keep weekly counseling sessions with Hoy Register, LCSW with [facility] the [place].   Conference call with Dual HCPOA's, [name] and [name], brother, [name] and daughter, Baxter Hire Lebron to discuss long-term care assisted living facility placement process. Completed and signed FL-2 Form e-mailed to daughter, Baxter Hire Corbo to begin searching for long-term care assisted living facilities near [place], [place].             Patient verbalizes understanding of instructions provided today and agrees to view in MyChart.   Telephone follow-up appointment with care management team member scheduled for:  04/03/2021 at 1:00pm  Danford Bad LCSW Licensed Clinical Social Worker [facility] [phone]

## 2021-03-26 NOTE — Telephone Encounter (Signed)
   Telephone encounter was:  Unsuccessful.  03/26/2021 Name: Madison Glover MRN: [id number] DOB: [birth year]  Unsuccessful outbound call made today to assist with:   Left message on voicemail for patient's POA [name] to return my call regarding senior citizen activities. Sent resource letter to the [employer] to be mailed to patient. Letter is saved in Epic.  Outreach Attempt:  2nd Attempt  A HIPAA compliant voice message was left requesting a return call.  Instructed patient to call back at [phone].  Jericha Bryden, AAS Paralegal, [facility] Care Guide  Embedded Care Coordination [facility]  Care Management  [address] [place], [place] [address] [email or web address]  ?? [phone]   [email or web address]

## 2021-03-26 NOTE — Chronic Care Management (AMB) (Signed)
Chronic Care Management    Clinical Social Work Note  03/26/2021 Name: Madison Glover MRN: [id number] DOB: 16-Jun-1951  Madison Glover is a 70 y.o. year old female who is a primary care patient of Philip Aspen, Limmie Patricia, MD. The CCM team was consulted to assist the patient with chronic disease management and/or care coordination needs related to: Level of Care Concerns and Mental Health Counseling and Resources.   Engaged with patient, patient's two friends/dual healthcare power of attorney's, brother and daughter by telephone for follow-up visit in response to provider referral for social work chronic care management and care coordination services.   Consent to Services:  The patient was given information about Chronic Care Management services, agreed to services, and gave verbal consent prior to initiation of services.  Please see initial visit note for detailed documentation.   Patient agreed to services and consent obtained.   Assessment: Review of patient past medical history, allergies, medications, and health status, including review of relevant consultants reports was performed today as part of a comprehensive evaluation and provision of chronic care management and care coordination services.     SDOH (Social Determinants of Health) assessments and interventions performed:    Advanced Directives Status: Not addressed in this encounter.  CCM Care Plan  Allergies  Allergen Reactions   Bee Venom Swelling    Outpatient Encounter Medications as of 03/26/2021  Medication Sig   alendronate (FOSAMAX) 70 MG tablet TAKE 1 TABLET BY MOUTH EVERY 7 DAYS WITH A FULL GLASS OF WATER ON AN EMPTY STOMACH. (Patient taking differently: Take 70 mg by mouth once a week.)   citalopram (CELEXA) 40 MG tablet Take 40 mg by mouth daily.   donepezil (ARICEPT) 10 MG tablet Take 1 tablet (10 mg total) by mouth at bedtime.   hydrochlorothiazide (HYDRODIURIL) 25 MG tablet TAKE 1 TABLET BY  MOUTH EVERY DAY (Patient taking differently: Take 25 mg by mouth daily.)   LOW-DOSE ASPIRIN PO Take 81 mg by mouth daily. Take 1 tablet daily   memantine (NAMENDA) 10 MG tablet Take 1 tablet (10 mg total) by mouth 2 (two) times daily.   metoprolol succinate (TOPROL-XL) 100 MG 24 hr tablet TAKE 1 TABLET BY MOUTH EVERY DAY   mupirocin ointment (BACTROBAN) 2 % Place 1 application into the nose 2 (two) times daily.   simvastatin (ZOCOR) 20 MG tablet TAKE 1 TABLET (20 MG TOTAL) BY MOUTH DAILY AT 6 PM.   vitamin B-12 (CYANOCOBALAMIN) 1000 MCG tablet Take 1,000 mcg by mouth daily.   No facility-administered encounter medications on file as of 03/26/2021.    Patient Active Problem List   Diagnosis Date Noted   MDD (major depressive disorder), recurrent episode, moderate (HCC)    Suicidal ideation    Neurodegenerative cognitive impairment (HCC) 11/06/2020   Positive colorectal cancer screening using Cologuard test    Encounter for screening colonoscopy 10/17/2020   Vitamin D deficiency 07/31/2020   Osteoporosis 05/02/2020   Hypertension    High cholesterol    Depression, recurrent (HCC)    Memory loss 08/02/2019   Vitamin B12 deficiency 08/02/2019    Conditions to be addressed/monitored: HTN and Depression.  Level of Care Concerns, ADL/IADL Limitations, Mental Health Concerns, Social Isolation, Limited Access to Caregiver, Cognitive Deficits, Memory Deficits, and Lacks Knowledge of [employer].  Care Plan : LCSW Plan of Care  Updates made by [name], LCSW since 03/26/2021 12:00 AM     Problem: Reduce and Manage My Symptoms of Depression.  Priority: High     Long-Range Goal: Reduce and Manage My Symptoms of Depression.   Start Date: 02/27/2021  Expected End Date: 04/29/2021  This Visit's Progress: On track  Recent Progress: On track  Priority: High  Note:   Current Barriers:   Acute Mental Health needs related to Dementia, Recurrent Major Depressive Disorder -  Moderate, Suicidal Ideation, Neurodegenerative Cognitive Impairment and Memory Loss.   Mental Health Concerns, Memory Deficits, Limited Access to Caregiver, and Lacks Knowledge of [employer]. Needs Support, Education, and Care Coordination in order to meet unmet mental health needs. Clinical Goal(s):  Patient will work with LCSW to reduce and manage symptoms of Depression and Suicidal Ideation.   Patient will increase knowledge and/or ability of:        Coping Skills, Healthy Habits, Self-Management Skills, Stress Reduction, Home Safety and Utilizing [employer] and Resources.  Clinical Interventions:  Other interventions included:       Solution-Focused Therapy Performed, Mindfulness Meditation Strategies, Relaxation Techniques and Deep Breathing Exercises Encouraged, Active Listening/       Reflection Utilized, Emotional Support Provided, Problem Solving /Task Center Solutions Developed, Psychoeducation /Health Education, Motivational        Interviewing, Brief Cognitive Behavioral Therapy Initiated, Reviewed Mental Health Medications and Discussed Compliance, Quality of Sleep Assessed and       Sleep Hygiene Techniques Promoted, Support Group Participation Encouraged, Increase Level of Activity/Exercise, Verbalization of Feelings Encouraged,        Crisis Resource Education/Information Provided, Suicidal Ideation/Homicidal Ideation Assessed. Discussed plans with patient for ongoing care management follow-up and provided patient and friends with direct contact information for care management team. Collaborated with Primary Care Physician, Dr. [name] regarding development and update of comprehensive plan of care as evidenced by provider attestation and co-signature. Inter-disciplinary care team collaboration (see longitudinal plan of care). Conference call with patient, patient's two friends/healthcare power of attorney's, [name] and [name], brother,  Karleen Hampshire Franek and daughter, Baxter Hire Pense. Patient Goals/Self-Care Activities: Accept all calls from [facility] to continue to receive ongoing mental health counseling and supportive services. Keep weekly counseling sessions with Hoy Register, LCSW with [facility] the [place].   Conference call with Dual HCPOA's, [name] and [name], brother, [name] and daughter, Baxter Hire Ciolino to discuss long-term care assisted living facility placement process. Completed and signed FL-2 Form e-mailed to daughter, Baxter Hire Higdon to begin searching for long-term care assisted living facilities near [place], [place].   Follow-Up Date:  04/03/2021 at 1:00pm       Follow-Up Plan:  04/03/2021 at 1:00pm      Danford Bad LCSW Licensed Clinical Social Worker [facility] [phone]

## 2021-03-27 DIAGNOSIS — E7800 Pure hypercholesterolemia, unspecified: Secondary | ICD-10-CM | POA: Diagnosis not present

## 2021-03-27 DIAGNOSIS — M810 Age-related osteoporosis without current pathological fracture: Secondary | ICD-10-CM | POA: Diagnosis not present

## 2021-03-27 DIAGNOSIS — F331 Major depressive disorder, recurrent, moderate: Secondary | ICD-10-CM

## 2021-03-27 DIAGNOSIS — F339 Major depressive disorder, recurrent, unspecified: Secondary | ICD-10-CM | POA: Diagnosis not present

## 2021-03-27 DIAGNOSIS — I10 Essential (primary) hypertension: Secondary | ICD-10-CM | POA: Diagnosis not present

## 2021-04-03 ENCOUNTER — Ambulatory Visit (INDEPENDENT_AMBULATORY_CARE_PROVIDER_SITE_OTHER): Payer: Medicare PPO | Admitting: *Deleted

## 2021-04-03 DIAGNOSIS — I10 Essential (primary) hypertension: Secondary | ICD-10-CM

## 2021-04-03 DIAGNOSIS — G319 Degenerative disease of nervous system, unspecified: Secondary | ICD-10-CM

## 2021-04-03 DIAGNOSIS — R413 Other amnesia: Secondary | ICD-10-CM

## 2021-04-03 DIAGNOSIS — M810 Age-related osteoporosis without current pathological fracture: Secondary | ICD-10-CM

## 2021-04-03 DIAGNOSIS — F331 Major depressive disorder, recurrent, moderate: Secondary | ICD-10-CM

## 2021-04-03 DIAGNOSIS — F339 Major depressive disorder, recurrent, unspecified: Secondary | ICD-10-CM

## 2021-04-03 NOTE — Patient Instructions (Signed)
Visit Information  PATIENT GOALS:  Goals Addressed             This Visit's Progress    Reduce and Manage My Symptoms of Depression.   On track    Timeframe:  Long-Range Goal Priority:  High Start Date:   02/27/2021                        Expected End Date:  04/29/2021                  Follow-Up Date:  04/10/2021 at 10:45am  Patient Goals/Self-Care Activities: Continue to receive ongoing mental health counseling and supportive services, to reduce and manage symptoms of Anxiety and Depression, through [facility]. Keep weekly counseling sessions with Hoy Register, LCSW with [facility] of the [place].   Second conference call with Dual HCPOA's, [name] and [name], brother, [name] and daughter, Baxter Hire Matt to discuss long-term care assisted living facility placement process. Daughter, Baxter Hire Kalt has located a long-term care assisted living facility in [place], [place] that she would like for you to consider, as well as tour.  Ms. Lorge is currently in the process of making arrangements to drive to [place] to transport you back to [place]. Dual HCPOA's, [name] and [name] agreed to make arrangements to visit you in your home to discuss all the advantages of pursing long-term care assisted living facility placement at this time.           Patient verbalizes understanding of instructions provided today and agrees to view in MyChart.   Telephone follow up appointment with care management team member scheduled for:  04/10/2021 at 10:45am  Danford Bad LCSW Licensed Clinical Social Worker [facility] [phone]

## 2021-04-03 NOTE — Chronic Care Management (AMB) (Signed)
Chronic Care Management    Clinical Social Work Note  04/03/2021 Name: Madison Glover MRN: [id number] DOB: 06/05/1951  Madison Glover is a 70 y.o. year old female who is a primary care patient of Madison Glover, Madison Patricia, MD. The CCM team was consulted to assist the patient with chronic disease management and/or care coordination needs related to: Level of Care Concerns and Mental Health Counseling and Resources.   Engaged with patient's 2 friends and dual healthcare power of attorney's, patient's brother and patient's daughter by telephone for follow-up visit in response to provider referral for social work chronic care management and care coordination services.   Consent to Services:  The patient was given information about Chronic Care Management services, agreed to services, and gave verbal consent prior to initiation of services.  Please see initial visit note for detailed documentation.   Patient agreed to services and consent obtained.   Assessment: Review of patient past medical history, allergies, medications, and health status, including review of relevant consultants reports was performed today as part of a comprehensive evaluation and provision of chronic care management and care coordination services.     SDOH (Social Determinants of Health) assessments and interventions performed:    Advanced Directives Status: Not addressed in this encounter.  CCM Care Plan  Allergies  Allergen Reactions   Bee Venom Swelling    Outpatient Encounter Medications as of 04/03/2021  Medication Sig   alendronate (FOSAMAX) 70 MG tablet TAKE 1 TABLET BY MOUTH EVERY 7 DAYS WITH A FULL GLASS OF WATER ON AN EMPTY STOMACH. (Patient taking differently: Take 70 mg by mouth once a week.)   citalopram (CELEXA) 40 MG tablet Take 40 mg by mouth daily.   donepezil (ARICEPT) 10 MG tablet Take 1 tablet (10 mg total) by mouth at bedtime.   hydrochlorothiazide (HYDRODIURIL) 25 MG tablet TAKE 1  TABLET BY MOUTH EVERY DAY (Patient taking differently: Take 25 mg by mouth daily.)   LOW-DOSE ASPIRIN PO Take 81 mg by mouth daily. Take 1 tablet daily   memantine (NAMENDA) 10 MG tablet Take 1 tablet (10 mg total) by mouth 2 (two) times daily.   metoprolol succinate (TOPROL-XL) 100 MG 24 hr tablet TAKE 1 TABLET BY MOUTH EVERY DAY   mupirocin ointment (BACTROBAN) 2 % Place 1 application into the nose 2 (two) times daily.   simvastatin (ZOCOR) 20 MG tablet TAKE 1 TABLET (20 MG TOTAL) BY MOUTH DAILY AT 6 PM.   vitamin B-12 (CYANOCOBALAMIN) 1000 MCG tablet Take 1,000 mcg by mouth daily.   No facility-administered encounter medications on file as of 04/03/2021.    Patient Active Problem List   Diagnosis Date Noted   MDD (major depressive disorder), recurrent episode, moderate (HCC)    Suicidal ideation    Neurodegenerative cognitive impairment (HCC) 11/06/2020   Positive colorectal cancer screening using Cologuard test    Encounter for screening colonoscopy 10/17/2020   Vitamin D deficiency 07/31/2020   Osteoporosis 05/02/2020   Hypertension    High cholesterol    Depression, recurrent (HCC)    Memory loss 08/02/2019   Vitamin B12 deficiency 08/02/2019    Conditions to be addressed/monitored: Anxiety and Depression.  Limited Social Support, Level of Care Concerns, ADL/IADL Limitations, Mental Health Concerns, Social Isolation, Limited Access to Caregiver, Cognitive Deficits, Memory Deficits, and Lacks Knowledge of [employer].  Care Plan : LCSW Plan of Care  Updates made by [name], LCSW since 04/03/2021 12:00 AM     Problem: Reduce  and Manage My Symptoms of Depression.   Priority: High     Long-Range Goal: Reduce and Manage My Symptoms of Depression.   Start Date: 02/27/2021  Expected End Date: 04/29/2021  This Visit's Progress: On track  Recent Progress: On track  Priority: High  Note:   Current Barriers:   Acute Mental Health needs related to Dementia,  Recurrent Major Depressive Disorder - Moderate, Suicidal Ideation, Neurodegenerative Cognitive Impairment and Memory Loss, needs Support, Education, Referrals, Resources and Care Coordination to meet unmet mental health needs.   Mental Health Concerns, Memory Deficits, Limited Access to Caregiver, and Lacks Knowledge of [employer]. Clinical Goal(s):  Patient will work with LCSW and [facility] to reduce and manage symptoms of Depression.  Patient will increase knowledge and/or ability of:        Coping Skills, Healthy Habits, Self-Management Skills, Stress Reduction, Home Safety and Utilizing [employer] and Resources.  Clinical Interventions:  Other interventions included:       Solution-Focused Therapy Performed, Mindfulness Meditation Strategies, Relaxation Techniques and Deep Breathing Exercises Encouraged, Active Listening/       Reflection Utilized, Emotional Support Provided, Problem Solving /Task Center Solutions Developed, Psychoeducation /Health Education, Motivational        Interviewing, Brief Cognitive Behavioral Therapy Initiated, Reviewed Mental Health Medications and Discussed Compliance, Quality of Sleep Assessed and       Sleep Hygiene Techniques Promoted, Support Group Participation Encouraged, Increase Level of Activity/Exercise, Verbalization of Feelings Encouraged,        Crisis Resource Education/Information Provided, Suicidal Ideation/Homicidal Ideation Assessed. Discussed plans with patient for ongoing care management follow-up and provided patient and friends with direct contact information for care management team. Collaborated with Primary Care Physician, Dr. [name] regarding development and update of comprehensive plan of care as evidenced by provider attestation and co-signature. Inter-disciplinary care team collaboration (see longitudinal plan of care). Second conference call with patient's two friends/healthcare power of attorney's,  [name] and [name], brother, [name] and daughter, [name]. Patient Goals/Self-Care Activities: Continue to receive ongoing mental health counseling and supportive services, to reduce and manage symptoms of Anxiety and Depression, through [facility]. Keep weekly counseling sessions with Hoy Register, LCSW with [facility] of the [place].   Second conference call with Dual HCPOA's, [name] and [name], brother, [name] and daughter, Baxter Hire Sohn to discuss long-term care assisted living facility placement process. Daughter, Baxter Hire Millette has located a long-term care assisted living facility in [place], [place] that she would like for you to consider, as well as tour.  Ms. Sida is currently in the process of making arrangements to drive to [place] to transport you back to [place]. Dual HCPOA's, [name] and [name] agreed to make arrangements to visit you in your home to discuss all the advantages of pursing long-term care assisted living facility placement at this time. Follow-Up Date:  04/10/2021 at 10:45am       Follow-Up Plan:  04/10/2021 at 10:45am      Danford Bad LCSW Licensed Clinical Social Worker [facility] [phone]

## 2021-04-10 ENCOUNTER — Ambulatory Visit: Payer: Medicare PPO | Admitting: *Deleted

## 2021-04-10 DIAGNOSIS — F339 Major depressive disorder, recurrent, unspecified: Secondary | ICD-10-CM

## 2021-04-10 DIAGNOSIS — I10 Essential (primary) hypertension: Secondary | ICD-10-CM

## 2021-04-10 DIAGNOSIS — G319 Degenerative disease of nervous system, unspecified: Secondary | ICD-10-CM

## 2021-04-10 DIAGNOSIS — M810 Age-related osteoporosis without current pathological fracture: Secondary | ICD-10-CM

## 2021-04-10 DIAGNOSIS — R413 Other amnesia: Secondary | ICD-10-CM

## 2021-04-10 DIAGNOSIS — F331 Major depressive disorder, recurrent, moderate: Secondary | ICD-10-CM

## 2021-04-10 NOTE — Chronic Care Management (AMB) (Signed)
Chronic Care Management    Clinical Social Work Note  04/10/2021 Name: Madison Glover MRN: [id number] DOB: 13-May-1951  Madison Glover is a 70 y.o. year old female who is a primary care patient of Philip Aspen, Limmie Patricia, MD. The CCM team was consulted to assist the patient with chronic disease management and/or care coordination needs related to: Level of Care Concerns and Mental Health Counseling and Resources.   Engaged with patient's friends/dual healthcare powers of attorney and patient's daughter by telephone for follow-up visit in response to provider referral for social work chronic care management and care coordination services.   Consent to Services:  The patient was given information about Chronic Care Management services, agreed to services, and gave verbal consent prior to initiation of services.  Please see initial visit note for detailed documentation.   Patient agreed to services and consent obtained.   Assessment: Review of patient past medical history, allergies, medications, and health status, including review of relevant consultants reports was performed today as part of a comprehensive evaluation and provision of chronic care management and care coordination services.     SDOH (Social Determinants of Health) assessments and interventions performed:    Advanced Directives Status: Not addressed in this encounter.  CCM Care Plan  Allergies  Allergen Reactions   Bee Venom Swelling    Outpatient Encounter Medications as of 04/10/2021  Medication Sig   alendronate (FOSAMAX) 70 MG tablet TAKE 1 TABLET BY MOUTH EVERY 7 DAYS WITH A FULL GLASS OF WATER ON AN EMPTY STOMACH. (Patient taking differently: Take 70 mg by mouth once a week.)   citalopram (CELEXA) 40 MG tablet Take 40 mg by mouth daily.   donepezil (ARICEPT) 10 MG tablet Take 1 tablet (10 mg total) by mouth at bedtime.   hydrochlorothiazide (HYDRODIURIL) 25 MG tablet TAKE 1 TABLET BY MOUTH EVERY DAY  (Patient taking differently: Take 25 mg by mouth daily.)   LOW-DOSE ASPIRIN PO Take 81 mg by mouth daily. Take 1 tablet daily   memantine (NAMENDA) 10 MG tablet Take 1 tablet (10 mg total) by mouth 2 (two) times daily.   metoprolol succinate (TOPROL-XL) 100 MG 24 hr tablet TAKE 1 TABLET BY MOUTH EVERY DAY   mupirocin ointment (BACTROBAN) 2 % Place 1 application into the nose 2 (two) times daily.   simvastatin (ZOCOR) 20 MG tablet TAKE 1 TABLET (20 MG TOTAL) BY MOUTH DAILY AT 6 PM.   vitamin B-12 (CYANOCOBALAMIN) 1000 MCG tablet Take 1,000 mcg by mouth daily.   No facility-administered encounter medications on file as of 04/10/2021.    Patient Active Problem List   Diagnosis Date Noted   MDD (major depressive disorder), recurrent episode, moderate (HCC)    Suicidal ideation    Neurodegenerative cognitive impairment (HCC) 11/06/2020   Positive colorectal cancer screening using Cologuard test    Encounter for screening colonoscopy 10/17/2020   Vitamin D deficiency 07/31/2020   Osteoporosis 05/02/2020   Hypertension    High cholesterol    Depression, recurrent (HCC)    Memory loss 08/02/2019   Vitamin B12 deficiency 08/02/2019    Conditions to be addressed/monitored: HTN and Depression.  Limited Social Support, Level of Care Concerns, ADL/IADL Limitations, Mental Health Concerns, Family and Relationship Dysfunction, Social Isolation, Limited Access to Caregiver, Cognitive Deficits, Memory Deficits, and Lacks Knowledge of [employer].  Care Plan : LCSW Plan of Care  Updates made by [name], LCSW since 04/10/2021 12:00 AM     Problem: Reduce and  Manage My Symptoms of Depression.   Priority: High     Long-Range Goal: Reduce and Manage My Symptoms of Depression.   Start Date: 02/27/2021  Expected End Date: 04/29/2021  This Visit's Progress: On track  Recent Progress: On track  Priority: High  Note:   Current Barriers:   Acute Mental Health needs related to Dementia,  Recurrent Major Depressive Disorder - Moderate, Suicidal Ideation, Neurodegenerative Cognitive Impairment and Memory Loss, needs Support, Education, Referrals, Resources and Care Coordination to meet unmet mental health needs.   Mental Health Concerns, Memory Deficits, Limited Access to Caregiver, and Lacks Knowledge of [employer]. Clinical Goal(s):  Patient will work with LCSW and [facility] to reduce and manage symptoms of Depression.  Patient will increase knowledge and/or ability of:        Coping Skills, Healthy Habits, Self-Management Skills, Stress Reduction, Home Safety and Utilizing [employer] and Resources.  Clinical Interventions:  Other interventions included:       Solution-Focused Therapy Performed, Mindfulness Meditation Strategies, Relaxation Techniques and Deep Breathing Exercises Encouraged, Active Listening/       Reflection Utilized, Emotional Support Provided, Problem Solving /Task Center Solutions Developed, Psychoeducation /Health Education, Motivational        Interviewing, Brief Cognitive Behavioral Therapy Initiated, Reviewed Mental Health Medications and Discussed Compliance, Quality of Sleep Assessed and       Sleep Hygiene Techniques Promoted, Support Group Participation Encouraged, Increase Level of Activity/Exercise, Verbalization of Feelings Encouraged,        Crisis Resource Education/Information Provided, Suicidal Ideation/Homicidal Ideation Assessed. Discussed plans with patient for ongoing care management follow-up and provided patient and friends with direct contact information for care management team. Collaborated with Primary Care Physician, Dr. [name] regarding development and update of comprehensive plan of care as evidenced by provider attestation and co-signature. Inter-disciplinary care team collaboration (see longitudinal plan of care). Patient Goals/Self-Care Activities: Keep weekly counseling sessions with Hoy Register, LCSW with [facility] [place], for ongoing mental health counseling and supportive services.   Third conference call with friends/dual healthcare power of attorney's, [name] and [name], and daughter, Baxter Hire Befort to discuss long-term care assisted living facility placement. [name], Mrs. Perlie Gold and Ms. Locken report that patient is adamant about remaining in her home, refusing any type of placement arrangements at this time. [name] and Mrs. Perlie Gold are pursing legal guardianship, in order to make decisions and act on your behalf, as they fear that you do not have adequate capacity to make appropriate decisions for yourself.   [name] and Mrs. Perlie Gold will consult with Silvano Rusk, Attorney at [facility], to discuss dual guardianship process. Follow-Up Date:  04/19/2021 at 11:45am       Follow-Up Plan:  04/19/2021 at 11:45am      Danford Bad LCSW Licensed Clinical Social Worker [facility] [phone]

## 2021-04-10 NOTE — Patient Instructions (Signed)
Visit Information  PATIENT GOALS:  Goals Addressed             This Visit's Progress    Reduce and Manage My Symptoms of Depression.   On track    Timeframe:  Long-Range Goal Priority:  High Start Date:   02/27/2021                        Expected End Date:  04/29/2021                  Follow-Up Date:  04/19/2021 at 11:45am  Patient Goals/Self-Care Activities: Keep weekly counseling sessions with Hoy Register, LCSW with [facility] the [place], for ongoing mental health counseling and supportive services.   Third conference call with friends/dual healthcare power of attorney's, [name] and [name], and daughter, Baxter Hire Albers to discuss long-term care assisted living facility placement. [name], Mrs. Perlie Gold and Ms. Nouri report that patient is adamant about remaining in her home, refusing any type of placement arrangements at this time. [name] and Mrs. Perlie Gold are pursing legal guardianship, in order to make decisions and act on your behalf, as they fear that you do not have adequate capacity to make appropriate decisions for yourself.   [name] and Mrs. Perlie Gold will consult with Silvano Rusk, Attorney at [facility], to discuss dual guardianship process.        Patient verbalizes understanding of instructions provided today and agrees to view in MyChart.   Telephone follow up appointment with care management team member scheduled for:  04/19/2021 at 11:45am  Danford Bad LCSW Licensed Clinical Social Worker [facility] [phone]

## 2021-04-16 ENCOUNTER — Encounter: Payer: Self-pay | Admitting: Internal Medicine

## 2021-04-16 ENCOUNTER — Ambulatory Visit: Payer: Medicare PPO

## 2021-04-16 ENCOUNTER — Other Ambulatory Visit: Payer: Self-pay

## 2021-04-16 ENCOUNTER — Ambulatory Visit: Payer: Medicare PPO | Admitting: Internal Medicine

## 2021-04-16 VITALS — BP 128/80 | HR 64 | Temp 98.0°F | Wt 133.7 lb

## 2021-04-16 DIAGNOSIS — G319 Degenerative disease of nervous system, unspecified: Secondary | ICD-10-CM | POA: Diagnosis not present

## 2021-04-16 DIAGNOSIS — Z23 Encounter for immunization: Secondary | ICD-10-CM

## 2021-04-16 NOTE — Addendum Note (Signed)
Addended by: Kern Reap B on: 04/16/2021 05:04 PM   Modules accepted: Orders

## 2021-04-16 NOTE — Progress Notes (Signed)
Established Patient Office Visit     This visit occurred during the SARS-CoV-2 public health emergency.  Safety protocols were in place, including screening questions prior to the visit, additional usage of staff PPE, and extensive cleaning of exam room while observing appropriate contact time as indicated for disinfecting solutions.    CC/Reason for Visit: Follow-up chronic conditions  HPI: Madison Glover is a 70 y.o. female who is coming in today for the above mentioned reasons. Past Medical History is significant for: A neuro cognitive disorder/dementia followed by neurology and neuropsychology.  She also has a history of osteoporosis on alendronate and hypertension well-controlled on hydrochlorothiazide.  She also has hyperlipidemia on statin therapy.  As usual she is here today with her friend [name].  She has been following with our Child psychotherapist as well.  There has been a plan for her to move to an assisted living facility in [place] with her daughter, however patient is reluctant to go.  [name] has many concerns about her safety at home including unlocked doors, stove left on, wandering behavior.  I agree that it is not safe for her to live independently.  She shows poor judgment and insight into her condition, she is very repetitive and tangential in conversation.   Past Medical/Surgical History: Past Medical History:  Diagnosis Date   Allergy    Anxiety    High cholesterol    Hypertension    Memory loss    Osteoporosis    Vitamin D deficiency     Past Surgical History:  Procedure Laterality Date   COLONOSCOPY WITH PROPOFOL N/A 10/19/2020   Procedure: COLONOSCOPY WITH PROPOFOL;  Surgeon: Rachael Fee, MD;  Location: [facility];  Service: Endoscopy;  Laterality: N/A;   LAPAROSCOPY     POLYPECTOMY  10/19/2020   Procedure: POLYPECTOMY;  Surgeon: Rachael Fee, MD;  Location: [facility];  Service: Endoscopy;;   TONSILLECTOMY      Social History:   reports that she has quit smoking. Her smoking use included cigarettes. She has been exposed to tobacco smoke. She has never used smokeless tobacco. She reports that she does not currently use alcohol. She reports that she does not use drugs.  Allergies: Allergies  Allergen Reactions   Bee Venom Swelling    Family History:  Family History  Problem Relation Age of Onset   Breast cancer Mother    COPD Father    Heart disease Father    Hypertension Father    Cancer Brother        type unknown   Colon cancer Neg Hx    Esophageal cancer Neg Hx    Stomach cancer Neg Hx    Rectal cancer Neg Hx      Current Outpatient Medications:    alendronate (FOSAMAX) 70 MG tablet, TAKE 1 TABLET BY MOUTH EVERY 7 DAYS WITH A FULL GLASS OF WATER ON AN EMPTY STOMACH. (Patient taking differently: Take 70 mg by mouth once a week.), Disp: 12 tablet, Rfl: 1   citalopram (CELEXA) 40 MG tablet, Take 40 mg by mouth daily., Disp: , Rfl:    donepezil (ARICEPT) 10 MG tablet, Take 1 tablet (10 mg total) by mouth at bedtime., Disp: 90 tablet, Rfl: 3   hydrochlorothiazide (HYDRODIURIL) 25 MG tablet, TAKE 1 TABLET BY MOUTH EVERY DAY (Patient taking differently: Take 25 mg by mouth daily.), Disp: 90 tablet, Rfl: 1   LOW-DOSE ASPIRIN PO, Take 81 mg by mouth daily. Take 1 tablet daily,  Disp: , Rfl:    memantine (NAMENDA) 10 MG tablet, Take 1 tablet (10 mg total) by mouth 2 (two) times daily., Disp: 180 tablet, Rfl: 3   metoprolol succinate (TOPROL-XL) 100 MG 24 hr tablet, TAKE 1 TABLET BY MOUTH EVERY DAY, Disp: 90 tablet, Rfl: 1   mupirocin ointment (BACTROBAN) 2 %, Place 1 application into the nose 2 (two) times daily., Disp: 22 g, Rfl: 0   simvastatin (ZOCOR) 20 MG tablet, TAKE 1 TABLET (20 MG TOTAL) BY MOUTH DAILY AT 6 PM., Disp: 90 tablet, Rfl: 1   vitamin B-12 (CYANOCOBALAMIN) 1000 MCG tablet, Take 1,000 mcg by mouth daily., Disp: , Rfl:   Review of Systems:  Constitutional: Denies fever, chills, diaphoresis,  appetite change and fatigue.  HEENT: Denies photophobia, eye pain, redness, hearing loss, ear pain, congestion, sore throat, rhinorrhea, sneezing, mouth sores, trouble swallowing, neck pain, neck stiffness and tinnitus.   Respiratory: Denies SOB, DOE, cough, chest tightness,  and wheezing.   Cardiovascular: Denies chest pain, palpitations and leg swelling.  Gastrointestinal: Denies nausea, vomiting, abdominal pain, diarrhea, constipation, blood in stool and abdominal distention.  Genitourinary: Denies dysuria, urgency, frequency, hematuria, flank pain and difficulty urinating.  Endocrine: Denies: hot or cold intolerance, sweats, changes in hair or nails, polyuria, polydipsia. Musculoskeletal: Denies myalgias, back pain, joint swelling, arthralgias and gait problem.  Skin: Denies pallor, rash and wound.  Neurological: Denies dizziness, seizures, syncope, weakness, light-headedness, numbness and headaches.  Hematological: Denies adenopathy. Easy bruising, personal or family bleeding history  Psychiatric/Behavioral: Denies suicidal ideation, mood changes, confusion, nervousness, sleep disturbance and agitation    Physical Exam: Vitals:   04/16/21 1336  BP: 128/80  Pulse: 64  Temp: 98 F (36.7 C)  TempSrc: Oral  SpO2: 99%  Weight: 133 lb 11.2 oz (60.6 kg)    Body mass index is 28.93 kg/m.   Constitutional: NAD, calm, comfortable Eyes: PERRL, lids and conjunctivae normal, wears corrective lenses ENMT: Mucous membranes are moist.  Respiratory: clear to auscultation bilaterally, no wheezing, no crackles. Normal respiratory effort. No accessory muscle use.  Cardiovascular: Regular rate and rhythm, no murmurs / rubs / gallops. No extremity edema.  Psychiatric: Normal judgment and insight. Alert and oriented x 3. Normal mood.    Impression and Plan:  Neurodegenerative cognitive impairment (HCC) -I agree that patient should not be living independently, and ALF with a memory care unit  would appear ideal. -I do not believe patient has decision-making capacity given her poor judgment and poor insight into her current condition, her rambling thoughts, unsafe behavior at home and all of the concerns that her caregiver brings to me today.  Time spent: 33 minutes reviewing chart, interviewing patient and friend, examining patient and formulating plan of care.      [name], MD [facility]

## 2021-04-17 ENCOUNTER — Ambulatory Visit: Payer: Medicare PPO | Admitting: *Deleted

## 2021-04-17 DIAGNOSIS — G319 Degenerative disease of nervous system, unspecified: Secondary | ICD-10-CM

## 2021-04-17 DIAGNOSIS — I10 Essential (primary) hypertension: Secondary | ICD-10-CM

## 2021-04-17 DIAGNOSIS — R413 Other amnesia: Secondary | ICD-10-CM

## 2021-04-17 DIAGNOSIS — M810 Age-related osteoporosis without current pathological fracture: Secondary | ICD-10-CM

## 2021-04-17 NOTE — Patient Instructions (Signed)
Visit Information  PATIENT GOALS:  Goals Addressed             This Visit's Progress    Improve My Quality of Life through Legal Guardianship and Long-Term Care Placement.   On track    Timeframe:  Long-Range Goal Priority:  High Start Date:   02/27/2021                        Expected End Date:  04/29/2021                  Follow-Up Date:  04/19/2021 at 11:45am  Patient Goals/Self-Care Activities: Spend time with friend/dual healthcare power of attorney, [name] and daughter, Baxter Hire [place] in [place], from 04/17/2021 - 04/21/2021, discussing long-term care assisted living facility placement and touring facilities of interest.   Keep conference call with LCSW, [name], Kristen Mchargue, and other friend/dual healthcare power of attorney, [name], scheduled for 04/19/2021 at 11:45am, to further discuss legal guardianship and long-term care placement. [name] and [name] have consulted with Ardeen Fillers, Attorney with [employer] and [employer] [phone]), requesting LCSW assistance with completion of paperwork to file for legal guardianship.  Contact LCSW directly (# [id number]) if you have questions, need assistance, or if additional social work needs are identified between now and our next scheduled telephone outreach call.          Patient verbalizes understanding of instructions provided today and agrees to view in MyChart.   Telephone follow up appointment with care management team member scheduled for:  04/19/2021 at 11:45am  Danford Bad LCSW Licensed Clinical Social Worker [facility] [phone]

## 2021-04-17 NOTE — Chronic Care Management (AMB) (Signed)
Chronic Care Management    Clinical Social Work Note  04/17/2021 Name: Madison Glover MRN: [id number] DOB: 07-Jul-1951  Madison Glover is a 70 y.o. year old female who is a primary care patient of Philip Aspen, Limmie Patricia, MD. The CCM team was consulted to assist the patient with chronic disease management and/or care coordination needs related to: Level of Care Concerns.   Engaged with patient's friend/dual healthcare power of attorney by telephone for follow-up visit in response to provider referral for social work chronic care management and care coordination services.   Consent to Services:  The patient was given information about Chronic Care Management services, agreed to services, and gave verbal consent prior to initiation of services.  Please see initial visit note for detailed documentation.   Patient agreed to services and consent obtained.   Assessment: Review of patient past medical history, allergies, medications, and health status, including review of relevant consultants reports was performed today as part of a comprehensive evaluation and provision of chronic care management and care coordination services.     SDOH (Social Determinants of Health) assessments and interventions performed:    Advanced Directives Status: Not addressed in this encounter.  CCM Care Plan  Allergies  Allergen Reactions   Bee Venom Swelling    Outpatient Encounter Medications as of 04/17/2021  Medication Sig   alendronate (FOSAMAX) 70 MG tablet TAKE 1 TABLET BY MOUTH EVERY 7 DAYS WITH A FULL GLASS OF WATER ON AN EMPTY STOMACH. (Patient taking differently: Take 70 mg by mouth once a week.)   citalopram (CELEXA) 40 MG tablet Take 40 mg by mouth daily.   donepezil (ARICEPT) 10 MG tablet Take 1 tablet (10 mg total) by mouth at bedtime.   hydrochlorothiazide (HYDRODIURIL) 25 MG tablet TAKE 1 TABLET BY MOUTH EVERY DAY (Patient taking differently: Take 25 mg by mouth daily.)   LOW-DOSE  ASPIRIN PO Take 81 mg by mouth daily. Take 1 tablet daily   memantine (NAMENDA) 10 MG tablet Take 1 tablet (10 mg total) by mouth 2 (two) times daily.   metoprolol succinate (TOPROL-XL) 100 MG 24 hr tablet TAKE 1 TABLET BY MOUTH EVERY DAY   mupirocin ointment (BACTROBAN) 2 % Place 1 application into the nose 2 (two) times daily.   simvastatin (ZOCOR) 20 MG tablet TAKE 1 TABLET (20 MG TOTAL) BY MOUTH DAILY AT 6 PM.   vitamin B-12 (CYANOCOBALAMIN) 1000 MCG tablet Take 1,000 mcg by mouth daily.   No facility-administered encounter medications on file as of 04/17/2021.    Patient Active Problem List   Diagnosis Date Noted   MDD (major depressive disorder), recurrent episode, moderate (HCC)    Suicidal ideation    Neurodegenerative cognitive impairment (HCC) 11/06/2020   Positive colorectal cancer screening using Cologuard test    Encounter for screening colonoscopy 10/17/2020   Vitamin D deficiency 07/31/2020   Osteoporosis 05/02/2020   Hypertension    High cholesterol    Depression, recurrent (HCC)    Memory loss 08/02/2019   Vitamin B12 deficiency 08/02/2019    Conditions to be addressed/monitored: Neurodegenerative Cognitive Impairment.  Limited Social Support, Level of Care Concerns, ADL/IADL Limitations, Mental Health Concerns, Family and Relationship Dysfunction, Social Isolation, Limited Access to Caregiver, Cognitive Deficits, Memory Deficits, and Lacks Knowledge of [employer].  Care Plan : LCSW Plan of Care  Updates made by [name], LCSW since 04/17/2021 12:00 AM     Problem: Improve My Quality of Life through Legal Guardianship and Long-Term Care  Placement.   Priority: High     Long-Range Goal: Improve My Quality of Life through Legal Guardianship and Long-Term Care Placement.   Start Date: 02/27/2021  Expected End Date: 04/29/2021  This Visit's Progress: On track  Recent Progress: On track  Priority: High  Note:   Current Barriers:   Chronic Health  needs related to Dementia, Recurrent Major Depressive Disorder - Moderate, Suicidal Ideation, Neurodegenerative Cognitive Impairment and Memory Loss, needs Support, Education, Referrals, Resources and Care Coordination to meet unmet personal care needs in the home. Clinical Goal(s):  Patient will work with LCSW to undergo legal guardianship process, as well as receive assistance with long-term care assisted living facility placement. Clinical Interventions:  Collaborated with Primary Care Physician, Dr. [name] regarding development and update of comprehensive plan of care as evidenced by provider attestation and co-signature. Inter-disciplinary care team collaboration (see longitudinal plan of care). Patient Goals/Self-Care Activities: Spend time with friend/dual healthcare power of attorney, [name] and daughter, Baxter Hire [place] in [place], from 04/17/2021 - 04/21/2021, discussing long-term care assisted living facility placement and touring facilities of interest.   Keep conference call with LCSW, [name], Kristen Woodfin, and other friend/dual healthcare power of attorney, [name], scheduled for 04/19/2021 at 11:45am, to further discuss legal guardianship and long-term care placement. [name] and [name] have consulted with Ardeen Fillers, Attorney with [employer] and [employer] [phone]), requesting LCSW assistance with completion of paperwork to file for legal guardianship.  Contact LCSW directly (# [id number]) if you have questions, need assistance, or if additional social work needs are identified between now and our next scheduled telephone outreach call.   Follow-Up Date:  04/19/2021 at 11:45am     Danford Bad LCSW Licensed Clinical Social Worker [facility] [phone]

## 2021-04-19 ENCOUNTER — Ambulatory Visit: Payer: Medicare PPO | Admitting: *Deleted

## 2021-04-19 DIAGNOSIS — E7800 Pure hypercholesterolemia, unspecified: Secondary | ICD-10-CM

## 2021-04-19 DIAGNOSIS — R413 Other amnesia: Secondary | ICD-10-CM

## 2021-04-19 DIAGNOSIS — G319 Degenerative disease of nervous system, unspecified: Secondary | ICD-10-CM

## 2021-04-19 DIAGNOSIS — I10 Essential (primary) hypertension: Secondary | ICD-10-CM

## 2021-04-19 DIAGNOSIS — M810 Age-related osteoporosis without current pathological fracture: Secondary | ICD-10-CM

## 2021-04-19 NOTE — Patient Instructions (Signed)
Visit Information  PATIENT GOALS:  Goals Addressed             This Visit's Progress    Improve My Quality of Life through Assisted Living Facility Placement.   On track    Timeframe:  Short-Term Goal Priority:  High Start Date:  04/19/2021                        Expected End Date:   06/19/2021                    Follow-Up Date: 04/26/2021 at 11:15am   Patient Goals/Self-Care Activities: Receive weekly/bi-weekly calls from LCSW, in an effort to coordinate long-term care assisted living facility placement.  Keep appointments to tour facilities of interest, which include [facility] in [place], and [facility] in [place], scheduled for 04/20/2021, and be prepared to report findings to LCSW during our next scheduled telephone outreach call.  Contact LCSW directly (# [phone]) if you have questions, need assistance, or if additional social work needs are identified.     COMPLETED: Improve My Quality of Life through Legal Guardianship and Long-Term Care Placement.   On track    Timeframe:  Long-Range Goal Priority:  High Start Date:   02/27/2021                        Expected End Date:  04/19/2021                 Patient Goals/Self-Care Activities: Spend time with friend/dual healthcare power of attorney, [name] and daughter, Baxter Hire [place] in [place], from 04/17/2021 - 04/21/2021, discussing long-term care assisted living facility placement and touring facilities of interest.   Keep conference call with LCSW, [name], Kristen Kory, and other friend/dual healthcare power of attorney, [name], scheduled for 04/19/2021 at 11:45am, to further discuss legal guardianship and long-term care placement. [name] and [name] have consulted with Ardeen Fillers, Attorney with [employer] and [employer] [phone]), requesting LCSW assistance with completion of paperwork to file for legal  guardianship.  Contact LCSW directly (# [phone]) if you have questions, need assistance, or if additional social work needs are identified between now and our next scheduled telephone outreach call.          Patient verbalizes understanding of instructions provided today and agrees to view in MyChart.   Telephone follow up appointment with care management team member scheduled for:  04/26/2021 at 11:15am  Danford Bad LCSW Licensed Clinical Social Worker [facility] [phone]

## 2021-04-19 NOTE — Chronic Care Management (AMB) (Signed)
Chronic Care Management    Clinical Social Work Note  04/19/2021 Name: Madison Glover MRN: [id number] DOB: 10/05/1950  Madison Glover is a 70 y.o. year old female who is a primary care patient of Philip Aspen, Limmie Patricia, MD. The CCM team was consulted to assist the patient with chronic disease management and/or care coordination needs related to: Level of Care Concerns.   Engaged with patient's friends/healthcare powers of attorney and patient's daughter by telephone for follow-up visit in response to provider referral for social work chronic care management and care coordination services.   Consent to Services:  The patient was given information about Chronic Care Management services, agreed to services, and gave verbal consent prior to initiation of services.  Please see initial visit note for detailed documentation.   Patient agreed to services and consent obtained.   Assessment: Review of patient past medical history, allergies, medications, and health status, including review of relevant consultants reports was performed today as part of a comprehensive evaluation and provision of chronic care management and care coordination services.     SDOH (Social Determinants of Health) assessments and interventions performed:    Advanced Directives Status: Not addressed in this encounter.  CCM Care Plan  Allergies  Allergen Reactions   Bee Venom Swelling    Outpatient Encounter Medications as of 04/19/2021  Medication Sig   alendronate (FOSAMAX) 70 MG tablet TAKE 1 TABLET BY MOUTH EVERY 7 DAYS WITH A FULL GLASS OF WATER ON AN EMPTY STOMACH. (Patient taking differently: Take 70 mg by mouth once a week.)   citalopram (CELEXA) 40 MG tablet Take 40 mg by mouth daily.   donepezil (ARICEPT) 10 MG tablet Take 1 tablet (10 mg total) by mouth at bedtime.   hydrochlorothiazide (HYDRODIURIL) 25 MG tablet TAKE 1 TABLET BY MOUTH EVERY DAY (Patient taking differently: Take 25 mg by mouth  daily.)   LOW-DOSE ASPIRIN PO Take 81 mg by mouth daily. Take 1 tablet daily   memantine (NAMENDA) 10 MG tablet Take 1 tablet (10 mg total) by mouth 2 (two) times daily.   metoprolol succinate (TOPROL-XL) 100 MG 24 hr tablet TAKE 1 TABLET BY MOUTH EVERY DAY   mupirocin ointment (BACTROBAN) 2 % Place 1 application into the nose 2 (two) times daily.   simvastatin (ZOCOR) 20 MG tablet TAKE 1 TABLET (20 MG TOTAL) BY MOUTH DAILY AT 6 PM.   vitamin B-12 (CYANOCOBALAMIN) 1000 MCG tablet Take 1,000 mcg by mouth daily.   No facility-administered encounter medications on file as of 04/19/2021.    Patient Active Problem List   Diagnosis Date Noted   MDD (major depressive disorder), recurrent episode, moderate (HCC)    Suicidal ideation    Neurodegenerative cognitive impairment (HCC) 11/06/2020   Positive colorectal cancer screening using Cologuard test    Encounter for screening colonoscopy 10/17/2020   Vitamin D deficiency 07/31/2020   Osteoporosis 05/02/2020   Hypertension    High cholesterol    Depression, recurrent (HCC)    Memory loss 08/02/2019   Vitamin B12 deficiency 08/02/2019    Conditions to be addressed/monitored: Neurodegenerative Cognitive Impairment and Memory Loss.  Limited Social Support, Level of Care Concerns, ADL/IADL Limitations, Social Isolation, Limited Access to Caregiver, Cognitive Deficits, Memory Deficits, and Lacks Knowledge of [employer].  Care Plan : LCSW Plan of Care  Updates made by [name], LCSW since 04/19/2021 12:00 AM     Problem: Improve My Quality of Life through Legal Guardianship and Long-Term Care Placement. Resolved  04/19/2021  Priority: High     Long-Range Goal: Improve My Quality of Life through Legal Guardianship and Long-Term Care Placement. Completed 04/19/2021  Start Date: 02/27/2021  Expected End Date: 04/19/2021  This Visit's Progress: On track  Recent Progress: On track  Priority: High  Note:   Current Barriers:    Chronic Health needs related to Dementia, Recurrent Major Depressive Disorder - Moderate, Suicidal Ideation, Neurodegenerative Cognitive Impairment and Memory Loss, needs Support, Education, Referrals, Resources and Care Coordination to meet unmet personal care needs in the home. Clinical Goal(s):  Patient will work with LCSW to undergo legal guardianship process, as well as receive assistance with long-term care assisted living facility placement. Clinical Interventions:  Collaborated with Primary Care Physician, Dr. [name] regarding development and update of comprehensive plan of care as evidenced by provider attestation and co-signature. Inter-disciplinary care team collaboration (see longitudinal plan of care). Patient Goals/Self-Care Activities: Spend time with friend/dual healthcare power of attorney, Madison Glover and daughter, Madison Glover in [place], from 04/17/2021 - 04/21/2021, discussing long-term care assisted living facility placement and touring facilities of interest.   Keep conference call with LCSW, Madison Glover, Kristen Wehrman, and other friend/dual healthcare power of attorney, Madison Glover, scheduled for 04/19/2021 at 11:45am, to further discuss legal guardianship and long-term care placement. Madison Glover and Madison Glover have consulted with Ardeen Fillers, Attorney with [employer] and [employer] [phone]), requesting LCSW assistance with completion of paperwork to file for legal guardianship.  Contact LCSW directly (# [phone]) if you have questions, need assistance, or if additional social work needs are identified between now and our next scheduled telephone outreach call.       Problem: Improve My Quality of Life through Assisted Living Facility Placement.   Priority: High     Goal: Improve My Quality of Life through Assisted Living Facility Placement.   Start Date: 04/19/2021  Expected End Date: 06/19/2021  This Visit's  Progress: On track  Priority: High  Note:   Current Barriers:  Patient with Dementia, Hypertension, Recurrent Major Depressive Disorder, Moderate, Neurodegenerative Cognitive Impairment, Suicidal Ideation and Memory Loss needs Support, Education, Referrals, Resources and Assistance with obtaining a higher level of care.   Clinical Goal(s):  Patient will work with LCSW to coordinate care for long-term assisted living facility placement.   LCSW Interventions:  Inter-disciplinary care team collaboration (see longitudinal plan of care). Collaboration with Primary Care Physician, Dr. [name] regarding development and update of comprehensive plan of care as evidenced by provider attestation and co-signature. Collaboration with Primary Care Physician, Dr. [name] to request completion of an FL-2 Form for placement purposes. Once completed and signed FL-2 Form is obtained, LCSW will fax to facilities of interest to try and pursue bed offers. Patient Goals/Self-Care Activities: Receive weekly/bi-weekly calls from LCSW, in an effort to coordinate long-term care assisted living facility placement.  Keep appointments to tour facilities of interest, which include [facility] in [place], and [facility] in [place], scheduled for 04/20/2021, and be prepared to report findings to LCSW during our next scheduled telephone outreach call.  Contact LCSW directly (# [phone]) if you have questions, need assistance, or if additional social work needs are identified. Follow-Up Plan:  04/26/2021 at 11:15am      Danford Bad LCSW Licensed Clinical Social Worker [facility] [phone]

## 2021-04-25 ENCOUNTER — Telehealth: Payer: Self-pay | Admitting: Internal Medicine

## 2021-04-25 MED ORDER — CITALOPRAM HYDROBROMIDE 40 MG PO TABS: 40.0000 mg | ORAL_TABLET | Freq: Every day | ORAL | 1 refills | Status: DC

## 2021-04-25 NOTE — Addendum Note (Signed)
Addended by: Kern Reap B on: 04/25/2021 05:11 PM   Modules accepted: Orders

## 2021-04-25 NOTE — Telephone Encounter (Signed)
PT friend call to request a refill of their citalopram (CELEXA) 40 MG tablet as they only have a week left or less. Please advise and send to the pharmacy on file.

## 2021-04-25 NOTE — Telephone Encounter (Signed)
Refill sent.

## 2021-04-25 NOTE — Telephone Encounter (Signed)
Never filled by Dr Ardyth Harps.  Okay to fill?

## 2021-04-26 ENCOUNTER — Ambulatory Visit: Payer: Medicare PPO

## 2021-04-26 ENCOUNTER — Telehealth: Payer: Medicare PPO

## 2021-04-26 ENCOUNTER — Ambulatory Visit: Payer: Medicare PPO | Admitting: *Deleted

## 2021-04-26 DIAGNOSIS — M810 Age-related osteoporosis without current pathological fracture: Secondary | ICD-10-CM | POA: Diagnosis not present

## 2021-04-26 DIAGNOSIS — F331 Major depressive disorder, recurrent, moderate: Secondary | ICD-10-CM | POA: Diagnosis not present

## 2021-04-26 DIAGNOSIS — E7800 Pure hypercholesterolemia, unspecified: Secondary | ICD-10-CM

## 2021-04-26 DIAGNOSIS — G319 Degenerative disease of nervous system, unspecified: Secondary | ICD-10-CM

## 2021-04-26 DIAGNOSIS — I10 Essential (primary) hypertension: Secondary | ICD-10-CM

## 2021-04-26 DIAGNOSIS — R413 Other amnesia: Secondary | ICD-10-CM

## 2021-04-26 DIAGNOSIS — F339 Major depressive disorder, recurrent, unspecified: Secondary | ICD-10-CM

## 2021-04-26 NOTE — Patient Instructions (Signed)
Visit Information  PATIENT GOALS:  Goals Addressed             This Visit's Progress    Improve My Quality of Life through Assisted Living Facility Placement.   On track    Timeframe:  Short-Term Goal Priority:  High Start Date:  04/19/2021                        Expected End Date:   06/19/2021                    Follow-Up Date:  05/06/2021 at 2:15pm  Patient Goals/Self-Care Activities: Receive weekly/bi-weekly calls from LCSW, in an effort to coordinate long-term care assisted living facility placement.  LCSW collaboration with Jacqlyn Larsen, Transport planner at [facility] in [place] [phone]), to secure the next available long-term female bed.   Contact LCSW directly (# [phone]) if you have questions, need assistance, or if additional social work needs are identified between now and our next scheduled telephone outreach call.        Patient verbalizes understanding of instructions provided today and agrees to view in MyChart.   Telephone follow up appointment with care management team member scheduled for:  05/06/2021 at 2:15pm  [facility] LCSW Licensed Clinical Social Worker [facility] [phone]

## 2021-04-26 NOTE — Chronic Care Management (AMB) (Signed)
Chronic Care Management    Clinical Social Work Note  04/26/2021 Name: Madison Glover MRN: [id number] DOB: [birth year]  Madison Glover is a 70 y.o. year old female who is a primary care patient of Philip Aspen, Limmie Patricia, MD. The CCM team was consulted to assist the patient with chronic disease management and/or care coordination needs related to: Level of Care Concerns.   Engaged with patient's two friends and dual powers of attorney by telephone for follow-up visit in response to provider referral for social work chronic care management and care coordination services.   Consent to Services:  The patient was given information about Chronic Care Management services, agreed to services, and gave verbal consent prior to initiation of services.  Please see initial visit note for detailed documentation.   Patient agreed to services and consent obtained.   Assessment: Review of patient past medical history, allergies, medications, and health status, including review of relevant consultants reports was performed today as part of a comprehensive evaluation and provision of chronic care management and care coordination services.     SDOH (Social Determinants of Health) assessments and interventions performed:    Advanced Directives Status: Not addressed in this encounter.  CCM Care Plan  Allergies  Allergen Reactions   Bee Venom Swelling    Outpatient Encounter Medications as of 04/26/2021  Medication Sig   alendronate (FOSAMAX) 70 MG tablet TAKE 1 TABLET BY MOUTH EVERY 7 DAYS WITH A FULL GLASS OF WATER ON AN EMPTY STOMACH. (Patient taking differently: Take 70 mg by mouth once a week.)   citalopram (CELEXA) 40 MG tablet Take 1 tablet (40 mg total) by mouth daily.   donepezil (ARICEPT) 10 MG tablet Take 1 tablet (10 mg total) by mouth at bedtime.   hydrochlorothiazide (HYDRODIURIL) 25 MG tablet TAKE 1 TABLET BY MOUTH EVERY DAY (Patient taking differently: Take 25 mg by mouth  daily.)   LOW-DOSE ASPIRIN PO Take 81 mg by mouth daily. Take 1 tablet daily   memantine (NAMENDA) 10 MG tablet Take 1 tablet (10 mg total) by mouth 2 (two) times daily.   metoprolol succinate (TOPROL-XL) 100 MG 24 hr tablet TAKE 1 TABLET BY MOUTH EVERY DAY   mupirocin ointment (BACTROBAN) 2 % Place 1 application into the nose 2 (two) times daily.   simvastatin (ZOCOR) 20 MG tablet TAKE 1 TABLET (20 MG TOTAL) BY MOUTH DAILY AT 6 PM.   vitamin B-12 (CYANOCOBALAMIN) 1000 MCG tablet Take 1,000 mcg by mouth daily.   No facility-administered encounter medications on file as of 04/26/2021.    Patient Active Problem List   Diagnosis Date Noted   MDD (major depressive disorder), recurrent episode, moderate (HCC)    Suicidal ideation    Neurodegenerative cognitive impairment (HCC) 11/06/2020   Positive colorectal cancer screening using Cologuard test    Encounter for screening colonoscopy 10/17/2020   Vitamin D deficiency 07/31/2020   Osteoporosis 05/02/2020   Hypertension    High cholesterol    Depression, recurrent (HCC)    Memory loss 08/02/2019   Vitamin B12 deficiency 08/02/2019    Conditions to be addressed/monitored: HTN and Depression.  Limited Social Support, Level of Care Concerns, ADL/IADL Limitations, Mental Health Concerns, Social Isolation, Limited Access to Caregiver, Memory Deficits and Cognitive Deficits.  Care Plan : LCSW Plan of Care  Updates made by [name], LCSW since 04/26/2021 12:00 AM     Problem: Improve My Quality of Life through Assisted Living Facility Placement.   Priority: High  Goal: Improve My Quality of Life through Assisted Living Facility Placement.   Start Date: 04/19/2021  Expected End Date: 06/19/2021  This Visit's Progress: On track  Recent Progress: On track  Priority: High  Note:   Current Barriers:  Patient with Dementia, Hypertension, Recurrent Major Depressive Disorder, Moderate, Neurodegenerative Cognitive Impairment,  Suicidal Ideation and Memory Loss needs Support, Education, Referrals, Resources and Assistance with obtaining a higher level of care.   Clinical Goal(s):  Patient will work with LCSW to coordinate care for long-term assisted living facility placement.   LCSW Interventions:  Inter-disciplinary care team collaboration (see longitudinal plan of care). Collaboration with Primary Care Physician, Dr. [name] regarding development and update of comprehensive plan of care as evidenced by provider attestation and co-signature. Patient Goals/Self-Care Activities: Receive weekly/bi-weekly calls from LCSW, in an effort to coordinate long-term care assisted living facility placement.  LCSW collaboration with Jacqlyn Larsen, Transport planner at [facility] [place] [phone]), to secure the next available long-term female bed.   Contact LCSW directly (# [phone]) if you have questions, need assistance, or if additional social work needs are identified between now and our next scheduled telephone outreach call. Follow-Up Plan:  05/06/2021 at 2:15pm     Danford Bad LCSW Licensed Clinical Social Worker [facility] [phone]

## 2021-04-26 NOTE — Chronic Care Management (AMB) (Signed)
Chronic Care Management   CCM RN Visit Note  04/26/2021 Name: Madison Glover MRN: [id number] DOB: 06-13-1951  Subjective: Madison Glover is a 70 y.o. year old female who is a primary care patient of Madison Glover, Madison Patricia, MD. The care management team was consulted for assistance with disease management and care coordination needs.    Engaged with patient by telephone for follow up visit in response to provider referral for case management and/or care coordination services.   Consent to Services:  The patient was given information about Chronic Care Management services, agreed to services, and gave verbal consent prior to initiation of services.  Please see initial visit note for detailed documentation.   Patient agreed to services and verbal consent obtained.   Assessment: Review of patient past medical history, allergies, medications, health status, including review of consultants reports, laboratory and other test data, was performed as part of comprehensive evaluation and provision of chronic care management services.   SDOH (Social Determinants of Health) assessments and interventions performed:    CCM Care Plan  Allergies  Allergen Reactions   Bee Venom Swelling    Outpatient Encounter Medications as of 04/26/2021  Medication Sig   alendronate (FOSAMAX) 70 MG tablet TAKE 1 TABLET BY MOUTH EVERY 7 DAYS WITH A FULL GLASS OF WATER ON AN EMPTY STOMACH. (Patient taking differently: Take 70 mg by mouth once a week.)   citalopram (CELEXA) 40 MG tablet Take 1 tablet (40 mg total) by mouth daily.   donepezil (ARICEPT) 10 MG tablet Take 1 tablet (10 mg total) by mouth at bedtime.   hydrochlorothiazide (HYDRODIURIL) 25 MG tablet TAKE 1 TABLET BY MOUTH EVERY DAY (Patient taking differently: Take 25 mg by mouth daily.)   LOW-DOSE ASPIRIN PO Take 81 mg by mouth daily. Take 1 tablet daily   memantine (NAMENDA) 10 MG tablet Take 1 tablet (10 mg total) by mouth 2 (two) times  daily.   metoprolol succinate (TOPROL-XL) 100 MG 24 hr tablet TAKE 1 TABLET BY MOUTH EVERY DAY   mupirocin ointment (BACTROBAN) 2 % Place 1 application into the nose 2 (two) times daily.   simvastatin (ZOCOR) 20 MG tablet TAKE 1 TABLET (20 MG TOTAL) BY MOUTH DAILY AT 6 PM.   vitamin B-12 (CYANOCOBALAMIN) 1000 MCG tablet Take 1,000 mcg by mouth daily.   No facility-administered encounter medications on file as of 04/26/2021.    Patient Active Problem List   Diagnosis Date Noted   MDD (major depressive disorder), recurrent episode, moderate (HCC)    Suicidal ideation    Neurodegenerative cognitive impairment (HCC) 11/06/2020   Positive colorectal cancer screening using Cologuard test    Encounter for screening colonoscopy 10/17/2020   Vitamin D deficiency 07/31/2020   Osteoporosis 05/02/2020   Hypertension    High cholesterol    Depression, recurrent (HCC)    Memory loss 08/02/2019   Vitamin B12 deficiency 08/02/2019    Conditions to be addressed/monitored:HTN, HLD, and Dementia  Care Plan : RNCM:Dementia (Adult)  Updates made by Madison Glassman, RN since 04/26/2021 12:00 AM     Problem: Potential of Harm or Injury related to dementia   Priority: High     Long-Range Goal: Harm or Injury Prevented related to dementia   Start Date: 02/18/2021  Expected End Date: 07/28/2021  This Visit's Progress: On track  Recent Progress: On track  Priority: High  Note:   Current Barriers:  Ineffective Self Health Maintenance in a patient with Dementia Unable to independently self  manage dementia Unable to self administer medications as prescribed Unable to perform IADLs independently Spoke with HCPOA Madison Glover friends.  States they are looking at assisted living facilities in [place] now. States that they have looked at [facility] which [facility] close and their church is involved with that facility States pt is forgetful and sometimes does not take her medications   States she is  eating good when they are with her but are not sure how she eats when she is alone. States that it has helped with her medications by moving her bedtime pill to take with her other evening pills.  States that pt has a paid caregiver come in on Saturdays for 3 hours.  Clinical Goal(s):  Collaboration with Madison Glover, Madison Patricia, MD regarding development and update of comprehensive plan of care as evidenced by provider attestation and co-signature Inter-disciplinary care team collaboration (see longitudinal plan of care) patient will work with care management team to address care coordination and chronic disease management needs related to Disease Management Educational Needs Care Coordination Medication Management and Education Psychosocial Support Caregiver Stress support Dementia and Caregiver Support Level of Care Concerns   Interventions:  Evaluation of current treatment plan related to HTN, HLD, and Dementia, Level of care concerns, ADL IADL limitations, Family and relationship dysfunction, Cognitive Deficits, Memory Deficits, Inability to perform IADL's independently, and Lacks knowledge of community resource: dementia  self-management and patient's adherence to plan as established by provider. Collaboration with Madison Glover, Madison Patricia, MD regarding development and update of comprehensive plan of care as evidenced by provider attestation       and co-signature Inter-disciplinary care team collaboration (see longitudinal plan of care) Discussed plans with patient for ongoing care management follow up and provided patient with direct contact information for care management team Referred to CCM social works for level of care issues, caregiver stress and resources-LCSW working with pt/caregivers to find assisted living facility in  now- LCSW to have call with caregiver today to review status of assisted living placement Referred to CCM pharmacist for medication adherence issues,  possible simplification of medication regimen, and pill packaging-referral completed Reinforced home safety issues with caregivers   Reinforced importance of good nutrition and getting enough fluids  Self Care Activities:  Self administers medications as prescribed Attends all scheduled provider appointments Calls pharmacy for medication refills Attends church or other social activities Calls provider office for new concerns or questions Patient Goals: - attend dementia support group - check out dementia website - check out other places when staying at home is no longer possible (assisted living center, nursing home) - check out services like in-home help or adult day care - check with a financial planner - connect with the local or national dementia organization - hold a family meeting with family members and loved ones - list the symptoms that would make staying at home too hard - make a list of future care and financial needs - make a list of people who can help and what they can do Follow Up Plan: Telephone follow up appointment with care management team member scheduled for: 05/27/21 at 11:30 AM The patient has been provided with contact information for the care management team and has been advised to call with any health related questions or concerns.      Care Plan : RNCM:Cardiovascular disease (HTN and HLD)  Updates made by Madison Glassman, RN since 04/26/2021 12:00 AM     Problem: Lack of long  term Cardiovascular disease Management (HTN and HLD)   Priority: Medium     Long-Range Goal: Effective Cardiovascular disease Self Management (HTN and HLD)   Start Date: 02/18/2021  Expected End Date: 07/28/2021  This Visit's Progress: On track  Recent Progress: On track  Priority: Medium  Note:   Current Barriers:  Knowledge Deficits related to basic understanding of Cardiovascular disease (HTN and HLD) pathophysiology and self care management Unable to independently Self  manage Cardiovascular disease (HTN and HLD) Unable to self administer medications as prescribed Unable to perform IADLs independently Spoke with HCPOA Madison Glover friends.Caregiver states that pt does not check B/P at home but it has been good when Madison Glover checks it for her Nurse Case Manager Clinical Goal(s):  patient will verbalize understanding of plan for hypertension management patient will attend all scheduled medical appointments:CCM LCSW 04/26/21  patient will demonstrate improved adherence to prescribed treatment plan for hypertension as evidenced by taking all medications as prescribed, monitoring and recording blood pressure as directed, adhering to low sodium/DASH diet patient will demonstrate improved health management independence as evidenced by checking blood pressure as directed and notifying PCP if SBP>160 or DBP > 90, taking all medications as prescribe, and adhering to a low sodium diet as discussed. patient will verbalize basic understanding of hypertension disease process and self health management plan as evidenced by readings within limits and adherence to medications  Interventions:  Collaboration with Madison Glover, Madison Patricia, MD regarding development and update of comprehensive plan of care as evidenced by provider attestation and co-signature Inter-disciplinary care team collaboration (see longitudinal plan of care) Evaluation of current treatment plan related to hypertension self management and patient's adherence to plan as established by provider. Provided education to patient re: stroke prevention, s/s of heart attack and stroke, DASH diet, complications of uncontrolled blood pressure Reviewed medications with patient and discussed importance of compliance Discussed plans with patient for ongoing care management follow up and provided patient with direct contact information for care management team Reinforced with caregiver to monitor blood pressure 1-2 times a week  and record, calling PCP for findings outside established parameters.  Reviewed scheduled/upcoming provider appointments including: CCM LCSW 05/27/21 Reinforced to contact pts insurance company to order B/P monitor from over the counter catalog  Referred to CCM social works for level of care issues, caregiver stress and resources-CCM LCSW working on assisted living placement Referred to CCM pharmacist for medication adherence issues, possible simplification of medication regimen, and pill packaging-referral completed Reinforced s/sx of hypotension to notify provider and the importance of drinking adequate amounts of fluids Self-Care Activities:  Self administers medications as prescribed Attends all scheduled provider appointments Calls pharmacy for medication refills Attends church or other social activities Calls provider office for new concerns or questions Patient Goals  - Self administer medications as prescribed  - Attend all scheduled provider appointments  - Call provider office for new concerns, questions, or BP outside discussed parameters  - Check BP and record as discussed  - Follows a low sodium diet/DASH diet - check blood pressure weekly - choose a place to take my blood pressure (home, clinic or office, retail store) - write blood pressure results in a log or diary - ask questions to understand Follow Up Plan: Telephone follow up appointment with care management team member scheduled for: 05/27/21 at 11:30 AM The patient has been provided with contact information for the care management team and has been advised to call with any health related questions or concerns.  Plan:Telephone follow up appointment with care management team member scheduled for:  05/27/21 and The patient has been provided with contact information for the care management team and has been advised to call with any health related questions or concerns.  Dudley Major RN, Maximiano Coss, CDE Care  Management Coordinator [facility] [phone], Mobile [phone]

## 2021-04-26 NOTE — Patient Instructions (Addendum)
Visit Information  PATIENT GOALS:  Goals Addressed             This Visit's Progress    RNCM:Planning for Long-Term Care-Dementia   On track    Timeframe:  Long-Range Goal Priority:  High Start Date:      02/18/21                       Expected End Date:      07/28/21                 Follow Up Date 05/27/21    - attend dementia support group - check out dementia website - check out other places when staying at home is no longer possible (assisted living center, nursing home) - check out services like in-home help or adult day care - check with a financial planner - connect with the local or national dementia organization - hold a family meeting with family members and loved ones - list the symptoms that would make staying at home too hard - make a list of future care and financial needs - make a list of people who can help and what they can do    Why is this important?   Learning that you or your loved one has dementia can be scary and stressful.  You can reduce stress by planning.  Preparing for the future is one of the most important things to do.  Thinking about how much care you/your loved one will need and how much it will cost is not easy.  Early on, you/your loved one can be part of making decisions for the future.  Making sure that your/your loved one's wishes for care are known is important.     Notes:      RNCM:Track and Manage My Blood Pressure-Hypertension   On track    Timeframe:  Long-Range Goal Priority:  Medium Start Date:        02/18/21                     Expected End Date:     07/28/21                  Follow Up Date 05/27/21    - check blood pressure weekly - choose a place to take my blood pressure (home, clinic or office, retail store) - write blood pressure results in a log or diary    Why is this important?   You won't feel high blood pressure, but it can still hurt your blood vessels.  High blood pressure can cause heart or kidney problems. It  can also cause a stroke.  Making lifestyle changes like losing a little weight or eating less salt will help.  Checking your blood pressure at home and at different times of the day can help to control blood pressure.  If the doctor prescribes medicine remember to take it the way the doctor ordered.  Call the office if you cannot afford the medicine or if there are questions about it.     Notes:         Patient verbalizes understanding of instructions provided today and agrees to view in MyChart.   Telephone follow up appointment with care management team member scheduled for: 05/27/21 11: 30 AM Mariell Nester RN, Anthony, CDE Care Management Coordinator [facility] [phone], Mobile [phone]

## 2021-05-01 ENCOUNTER — Telehealth: Payer: Self-pay | Admitting: Pharmacist

## 2021-05-01 NOTE — Chronic Care Management (AMB) (Signed)
Chronic Care Management Pharmacy Assistant   Name: Madison Glover  MRN: [id number] DOB: 01-05-1951  Reason for Encounter: Disease State/ Hypertension Assessment Call   Conditions to be addressed/monitored: HTN  Recent office visits:  05/23/21 Madison Stamps, LCSW - Patient presented for Dementia, Hypertension, Recurrent Major Depressive Disorder - Moderate, Neurodegenerative Cognitive Impairment, Suicidal Ideation and Memory Loss and HLD. No medication changes.  05/16/21 [name], [name], LCSW - Patient presented for Dementia, Hypertension, Recurrent Major Depressive Disorder - Moderate, Neurodegenerative Cognitive Impairment, Suicidal Ideation and Memory Loss and HLD. No medication changes.  05/13/21 [name], [name], LCSW - Patient presented for Dementia, Hypertension, Recurrent Major Depressive Disorder - Moderate, Neurodegenerative Cognitive Impairment, Suicidal Ideation and Memory Loss and HLD. No medication changes.  05/06/21 05/13/21 [name], [name], LCSW - Patient presented for Dementia, Hypertension, Recurrent Major Depressive Disorder - Moderate, Neurodegenerative Cognitive Impairment, Suicidal Ideation and Memory Loss and HLD. No medication changes.  04/26/2021 [name], [name], LCSW - Patient presented for Dementia, Hypertension, Recurrent Major Depressive Disorder - Moderate, Neurodegenerative Cognitive Impairment, Suicidal Ideation and Memory Loss and HLD. No medication changes.  04/26/2021 Yetta Glassman, RN - Patient presented for Follow up Outreach Chronic Care Management and coordination needs. No medication changes.  9/23/022 [name], [name], LCSW - Patient presented for Dementia, Hypertension, Recurrent Major Depressive Disorder - Moderate, Neurodegenerative Cognitive Impairment, Suicidal Ideation and Memory Loss and HLD. No medication changes.  04/16/2021 Madison Glover, Madison Patricia, MD - Patient presented for Neurodegenerative cognitive  impairment and other concerns. No medication changes.   Recent consult visits:  None  Hospital visits:  Medication Reconciliation was completed by comparing discharge summary, patient's EMR and Pharmacy list, and upon discussion with patient.   Presented to Madison Glover Glover on  02/21/2021 due to Dysthymia and Current moderate episode of major depressive disorder. Discharge date was 02/23/2021.    New?Medications Started at [facility] Discharge:?? -started None     Medication Changes at Hospital Discharge: -Changed  None   Medications Discontinued at Hospital Discharge: -Stopped  None   Medications that remain the same after Hospital Discharge:??  -All other medications will remain the same.       Hospital visits:  Medication Reconciliation was completed by comparing discharge summary, patient's EMR and Pharmacy list, and upon discussion with patient.   Presented to Madison Glover on  02/20/2021 due to episode of major depressive disorder. Patient was present for 1 hour.   New?Medications Started at [facility] Discharge:?? -started None     Medication Changes at Hospital Discharge: -Changed  None   Medications Discontinued at Hospital Discharge: -Stopped  None   Medications that remain the same after Hospital Discharge:??  -All other medications will remain the same.     Medication Reconciliation was completed by comparing discharge summary, patient's EMR and Pharmacy list, and upon discussion with patient.   Presented to Madison Glover  on 10/17/2020 for colonoscopy and positive colorectal cancer screening using Cologaurd    Medications Started at [facility] Discharge:?? -started None     Medication Changes at Hospital Discharge: -Changed  None   Medications Discontinued at Hospital Discharge: -Stopped  None   Medications that remain the same after Hospital Discharge:??  -All other medications will remain the  same.      Medications: Outpatient Encounter Medications as of 05/01/2021  Medication Sig   alendronate (FOSAMAX) 70 MG tablet TAKE 1 TABLET BY MOUTH EVERY 7 DAYS WITH A  FULL GLASS OF WATER ON AN EMPTY STOMACH. (Patient taking differently: Take 70 mg by mouth once a week.)   citalopram (CELEXA) 40 MG tablet Take 1 tablet (40 mg total) by mouth daily.   donepezil (ARICEPT) 10 MG tablet Take 1 tablet (10 mg total) by mouth at bedtime.   hydrochlorothiazide (HYDRODIURIL) 25 MG tablet TAKE 1 TABLET BY MOUTH EVERY DAY (Patient taking differently: Take 25 mg by mouth daily.)   LOW-DOSE ASPIRIN PO Take 81 mg by mouth daily. Take 1 tablet daily   memantine (NAMENDA) 10 MG tablet Take 1 tablet (10 mg total) by mouth 2 (two) times daily.   metoprolol succinate (TOPROL-XL) 100 MG 24 hr tablet TAKE 1 TABLET BY MOUTH EVERY DAY   mupirocin ointment (BACTROBAN) 2 % Place 1 application into the nose 2 (two) times daily.   simvastatin (ZOCOR) 20 MG tablet TAKE 1 TABLET (20 MG TOTAL) BY MOUTH DAILY AT 6 PM.   vitamin B-12 (CYANOCOBALAMIN) 1000 MCG tablet Take 1,000 mcg by mouth daily.   No facility-administered encounter medications on file as of 05/01/2021.  Reviewed chart prior to disease state call. Spoke with patient regarding BP  Recent Office Vitals: BP Readings from Last 3 Encounters:  04/16/21 128/80  03/08/21 124/80  02/23/21 (!) 141/81   Pulse Readings from Last 3 Encounters:  04/16/21 64  03/08/21 (!) 50  02/23/21 84    Wt Readings from Last 3 Encounters:  04/16/21 133 lb 11.2 oz (60.6 kg)  03/08/21 128 lb 8 oz (58.3 kg)  02/23/21 132 lb 7.9 oz (60.1 kg)     Kidney Function Lab Results  Component Value Date/Time   CREATININE 1.37 (H) 02/21/2021 11:33 PM   CREATININE 1.34 (H) 02/20/2021 02:53 PM   GFR 57.95 (L) 07/31/2020 11:42 AM   GFRNONAA 41 (L) 02/21/2021 11:33 PM    BMP Latest Ref Rng & Units 02/21/2021 02/20/2021 10/17/2020  Glucose 70 - 99 mg/dL 875(I) 433(I) 951(O)  BUN 8 -  23 mg/dL 23 17 84(Z)  Creatinine 0.44 - 1.00 mg/dL 6.60(Y) 3.01(S) 0.10(X)  Sodium 135 - 145 mmol/L 134(L) 134(L) 137  Potassium 3.5 - 5.1 mmol/L 3.2(L) 3.7 3.8  Chloride 98 - 111 mmol/L 92(L) 95(L) 102  CO2 22 - 32 mmol/L 27 30 23   Calcium 8.9 - 10.3 mg/dL 9.1 9.3 9.9   Call to [place] friend for call, patient is not checking herself per notes in chart  Current antihypertensive regimen:  Hydrochlorothiazide 25 mg 1 tablet daily Metoprolol succinate 100 mg 1 tablet daily   Care Gaps: BP - 120/80 Hepatitis C Screening - Overdue AWV- 07/2020 CCM - Not scheduled  Star Rating Drugs: Simvastatin (Zocor) 20 mg - Last filled [facility]/20/2021 [facility] verified the above fill date as correct  Notes: Call to patients friend 01/22/2021 left voicemail for return call 05-01-21 05-02-2021 Call to [place] she advised she was out of town and [place] has been doing recently to call her, call to [facility] left vm for call back 10/27 did not receive call back from care givers per patient notes in chart pt is to be admitted to ALF  11/27 [facility] Clinical Pharmacist Assistant [phone]

## 2021-05-06 ENCOUNTER — Ambulatory Visit (INDEPENDENT_AMBULATORY_CARE_PROVIDER_SITE_OTHER): Payer: Medicare PPO | Admitting: *Deleted

## 2021-05-06 DIAGNOSIS — F339 Major depressive disorder, recurrent, unspecified: Secondary | ICD-10-CM

## 2021-05-06 DIAGNOSIS — M810 Age-related osteoporosis without current pathological fracture: Secondary | ICD-10-CM

## 2021-05-06 DIAGNOSIS — I10 Essential (primary) hypertension: Secondary | ICD-10-CM

## 2021-05-06 DIAGNOSIS — G319 Degenerative disease of nervous system, unspecified: Secondary | ICD-10-CM

## 2021-05-06 DIAGNOSIS — R413 Other amnesia: Secondary | ICD-10-CM

## 2021-05-06 DIAGNOSIS — F331 Major depressive disorder, recurrent, moderate: Secondary | ICD-10-CM

## 2021-05-06 DIAGNOSIS — E7800 Pure hypercholesterolemia, unspecified: Secondary | ICD-10-CM

## 2021-05-06 NOTE — Patient Instructions (Signed)
Visit Information  PATIENT GOALS:  Goals Addressed             This Visit's Progress    Improve My Quality of Life through Assisted Living Facility Placement.   On track    Timeframe:  Short-Term Goal Priority:  High Start Date:  04/19/2021                        Expected End Date:   06/19/2021                    Follow-Up Date:  05/13/2021 at 2:00pm  Patient Goals/Self-Care Activities: Receive weekly/bi-weekly calls from LCSW, in an effort to coordinate long-term memory care assisted living facility placement.  LCSW continued collaboration with Jacqlyn Larsen, Transport planner at [facility] in [place] [phone]), to secure the next available long-term memory care female bed.   According to Mrs. Beane, you will require long-term memory care assisted living facility  placement, as opposed to just assisted living, due to recent score of Dementia Screening.   Begin considering alternate placement arrangements, from the complete list of memory care assisted living facilities provided to you, in the event that [facility] in [facility] [place] is unable to make a bed offer. Contact LCSW directly (# [phone]) if you have questions, need assistance, or if additional social work needs are identified between now and our next scheduled telephone outreach call.        Patient verbalizes understanding of instructions provided today and agrees to view in MyChart.   Telephone follow up appointment with care management team member scheduled for:  05/13/2021 at 2:00pm  [facility] LCSW Licensed Clinical Social Worker [facility] [phone]

## 2021-05-06 NOTE — Chronic Care Management (AMB) (Signed)
Chronic Care Management    Clinical Social Work Note  05/06/2021 Name: Madison Glover MRN: [id number] DOB: 05-17-1951  Madison Glover is a 70 y.o. year old female who is a primary care patient of Philip Aspen, Limmie Patricia, MD. The CCM team was consulted to assist the patient with chronic disease management and/or care coordination needs related to: Level of Care Concerns and Mental Health Counseling and Resources.   Engaged with patient's friends/dual healthcare powers of attorney by telephone for follow-up visit in response to provider referral for social work chronic care management and care coordination services.   Consent to Services:  The patient was given information about Chronic Care Management services, agreed to services, and gave verbal consent prior to initiation of services.  Please see initial visit note for detailed documentation.   Patient agreed to services and consent obtained.   Assessment: Review of patient past medical history, allergies, medications, and health status, including review of relevant consultants reports was performed today as part of a comprehensive evaluation and provision of chronic care management and care coordination services.     SDOH (Social Determinants of Health) assessments and interventions performed:    Advanced Directives Status: Not addressed in this encounter.  CCM Care Plan  Allergies  Allergen Reactions   Bee Venom Swelling    Outpatient Encounter Medications as of 05/06/2021  Medication Sig   alendronate (FOSAMAX) 70 MG tablet TAKE 1 TABLET BY MOUTH EVERY 7 DAYS WITH A FULL GLASS OF WATER ON AN EMPTY STOMACH. (Patient taking differently: Take 70 mg by mouth once a week.)   citalopram (CELEXA) 40 MG tablet Take 1 tablet (40 mg total) by mouth daily.   donepezil (ARICEPT) 10 MG tablet Take 1 tablet (10 mg total) by mouth at bedtime.   hydrochlorothiazide (HYDRODIURIL) 25 MG tablet TAKE 1 TABLET BY MOUTH EVERY DAY  (Patient taking differently: Take 25 mg by mouth daily.)   LOW-DOSE ASPIRIN PO Take 81 mg by mouth daily. Take 1 tablet daily   memantine (NAMENDA) 10 MG tablet Take 1 tablet (10 mg total) by mouth 2 (two) times daily.   metoprolol succinate (TOPROL-XL) 100 MG 24 hr tablet TAKE 1 TABLET BY MOUTH EVERY DAY   mupirocin ointment (BACTROBAN) 2 % Place 1 application into the nose 2 (two) times daily.   simvastatin (ZOCOR) 20 MG tablet TAKE 1 TABLET (20 MG TOTAL) BY MOUTH DAILY AT 6 PM.   vitamin B-12 (CYANOCOBALAMIN) 1000 MCG tablet Take 1,000 mcg by mouth daily.   No facility-administered encounter medications on file as of 05/06/2021.    Patient Active Problem List   Diagnosis Date Noted   MDD (major depressive disorder), recurrent episode, moderate (HCC)    Suicidal ideation    Neurodegenerative cognitive impairment (HCC) 11/06/2020   Positive colorectal cancer screening using Cologuard test    Encounter for screening colonoscopy 10/17/2020   Vitamin D deficiency 07/31/2020   Osteoporosis 05/02/2020   Hypertension    High cholesterol    Depression, recurrent (HCC)    Memory loss 08/02/2019   Vitamin B12 deficiency 08/02/2019    Conditions to be addressed/monitored: Anxiety, Depression, and Dementia.  Limited Social Support, Level of Care Concerns, ADL/IADL Limitations, Mental Health Concerns, Family and Relationship Dysfunction, Social Isolation, Limited Access to Caregiver, Cognitive Deficits, Memory Deficits, and Lacks Knowledge of [employer].  Care Plan : LCSW Plan of Care  Updates made by [name], LCSW since 05/06/2021 12:00 AM     Problem: Improve  My Quality of Life through Assisted Living Facility Placement.   Priority: High     Goal: Improve My Quality of Life through Assisted Living Facility Placement.   Start Date: 04/19/2021  Expected End Date: 06/19/2021  This Visit's Progress: On track  Recent Progress: On track  Priority: High  Note:   Current  Barriers:  Patient with Dementia, Hypertension, Recurrent Major Depressive Disorder, Moderate, Neurodegenerative Cognitive Impairment, Suicidal Ideation and Memory Loss needs Support, Education, Referrals, Resources and Assistance with obtaining a higher level of care.   Clinical Goal(s):  Patient will work with LCSW to coordinate care for long-term memory care assisted living facility placement.   LCSW Interventions:  Collaboration with Primary Care Physician, Dr. [name] regarding development and update of comprehensive plan of care as evidenced by provider attestation and co-signature. Patient Goals/Self-Care Activities: Receive weekly/bi-weekly calls from LCSW, in an effort to coordinate long-term memory care assisted living facility placement.  LCSW continued collaboration with Jacqlyn Larsen, Transport planner at [facility] in [place] - [place] [phone]), to secure the next available long-term memory care female bed.   According to Mrs. Beane, you will require long-term memory care assisted living facility placement, as opposed to just assisted living, due to  recent score of Dementia Screening.   Begin considering alternate placement arrangements, from the complete list of memory care assisted living facilities provided to you, in the event that [facility] Facility in [facility] [place] is unable to make a bed offer. Contact LCSW directly (# [phone]) if you have questions, need assistance, or if additional social work needs are identified between now and our next scheduled telephone outreach call. Follow-Up Plan:  05/13/2021 at 2:00pm     Danford Bad LCSW Licensed Clinical Social Worker [facility] [phone]

## 2021-05-13 ENCOUNTER — Ambulatory Visit: Payer: Medicare PPO | Admitting: *Deleted

## 2021-05-13 DIAGNOSIS — F331 Major depressive disorder, recurrent, moderate: Secondary | ICD-10-CM

## 2021-05-13 DIAGNOSIS — R413 Other amnesia: Secondary | ICD-10-CM

## 2021-05-13 DIAGNOSIS — G319 Degenerative disease of nervous system, unspecified: Secondary | ICD-10-CM

## 2021-05-13 DIAGNOSIS — F339 Major depressive disorder, recurrent, unspecified: Secondary | ICD-10-CM

## 2021-05-13 DIAGNOSIS — I10 Essential (primary) hypertension: Secondary | ICD-10-CM

## 2021-05-13 DIAGNOSIS — M810 Age-related osteoporosis without current pathological fracture: Secondary | ICD-10-CM

## 2021-05-13 NOTE — Patient Instructions (Addendum)
Visit Information  PATIENT GOALS:  Goals Addressed             This Visit's Progress    Improve My Quality of Life through Assisted Living Facility Placement.   On track    Timeframe:  Short-Term Goal Priority:  High Start Date:  04/19/2021                        Expected End Date:   06/19/2021                    Follow-Up Date:  05/30/2021 at 10:45am  Patient Goals/Self-Care Activities: Continue to receive weekly/bi-weekly calls from LCSW, in an effort to coordinate long-term memory care assisted living facility placement.  Decide whether or not you would like to reside at [facility] in [facility], or if you would like to begin pursing alternate placement arrangements.  Bed offer received, but not accepted.  Please discuss with dual healthcare power's of attorney, and report findings to LCSW.   Contact LCSW directly (# [phone]) if you have questions, need assistance, or if additional social work needs are identified between now and our next scheduled telephone outreach call.         Patient verbalizes understanding of instructions provided today and agrees to view in MyChart.   Telephone follow up appointment with care management team member scheduled for:  05/30/2021 at 10:45am  Danford Bad LCSW Licensed Clinical Social Worker [facility] [phone]

## 2021-05-13 NOTE — Chronic Care Management (AMB) (Signed)
Chronic Care Management    Clinical Social Work Note  05/13/2021 Name: Madison Glover MRN: [id number] DOB: Mar 29, 1951  Madison Glover is a 70 y.o. year old female who is a primary care patient of Philip Aspen, Limmie Patricia, MD. The CCM team was consulted to assist the patient with chronic disease management and/or care coordination needs related to: Level of Care Concerns.   Engaged with patient's friend/dual healthcare power of attorney by telephone for follow-up visit in response to provider referral for social work chronic care management and care coordination services.   Consent to Services:  The patient was given information about Chronic Care Management services, agreed to services, and gave verbal consent prior to initiation of services.  Please see initial visit note for detailed documentation.   Patient agreed to services and consent obtained.   Assessment: Review of patient past medical history, allergies, medications, and health status, including review of relevant consultants reports was performed today as part of a comprehensive evaluation and provision of chronic care management and care coordination services.     SDOH (Social Determinants of Health) assessments and interventions performed:    Advanced Directives Status: Not addressed in this encounter.  CCM Care Plan  Allergies  Allergen Reactions   Bee Venom Swelling    Outpatient Encounter Medications as of 05/13/2021  Medication Sig   alendronate (FOSAMAX) 70 MG tablet TAKE 1 TABLET BY MOUTH EVERY 7 DAYS WITH A FULL GLASS OF WATER ON AN EMPTY STOMACH. (Patient taking differently: Take 70 mg by mouth once a week.)   citalopram (CELEXA) 40 MG tablet Take 1 tablet (40 mg total) by mouth daily.   donepezil (ARICEPT) 10 MG tablet Take 1 tablet (10 mg total) by mouth at bedtime.   hydrochlorothiazide (HYDRODIURIL) 25 MG tablet TAKE 1 TABLET BY MOUTH EVERY DAY (Patient taking differently: Take 25 mg by mouth  daily.)   LOW-DOSE ASPIRIN PO Take 81 mg by mouth daily. Take 1 tablet daily   memantine (NAMENDA) 10 MG tablet Take 1 tablet (10 mg total) by mouth 2 (two) times daily.   metoprolol succinate (TOPROL-XL) 100 MG 24 hr tablet TAKE 1 TABLET BY MOUTH EVERY DAY   mupirocin ointment (BACTROBAN) 2 % Place 1 application into the nose 2 (two) times daily.   simvastatin (ZOCOR) 20 MG tablet TAKE 1 TABLET (20 MG TOTAL) BY MOUTH DAILY AT 6 PM.   vitamin B-12 (CYANOCOBALAMIN) 1000 MCG tablet Take 1,000 mcg by mouth daily.   No facility-administered encounter medications on file as of 05/13/2021.    Patient Active Problem List   Diagnosis Date Noted   MDD (major depressive disorder), recurrent episode, moderate (HCC)    Suicidal ideation    Neurodegenerative cognitive impairment (HCC) 11/06/2020   Positive colorectal cancer screening using Cologuard test    Encounter for screening colonoscopy 10/17/2020   Vitamin D deficiency 07/31/2020   Osteoporosis 05/02/2020   Hypertension    High cholesterol    Depression, recurrent (HCC)    Memory loss 08/02/2019   Vitamin B12 deficiency 08/02/2019    Conditions to be addressed/monitored: Depression, Caregiver Stress and Neurodegenerative Cognitive Impairment.  Limited Social Support, Level of Care Concerns, ADL/IADL Limitations, Family and Relationship Dysfunction, Social Isolation, Limited Access to Caregiver, Cognitive Deficits, Memory Deficits, and Lacks Knowledge of [employer].  Care Plan : LCSW Plan of Care  Updates made by [name], LCSW since 05/13/2021 12:00 AM     Problem: Improve My Quality of Life through Assisted  Living Facility Placement.   Priority: High     Goal: Improve My Quality of Life through Assisted Living Facility Placement.   Start Date: 04/19/2021  Expected End Date: 06/19/2021  This Visit's Progress: On track  Recent Progress: On track  Priority: High  Note:   Current Barriers:  Patient with Dementia,  Hypertension, Recurrent Major Depressive Disorder, Moderate, Neurodegenerative Cognitive Impairment, Suicidal Ideation and Memory Loss needs Support, Education, Referrals, Resources and Assistance with obtaining a higher level of care.   Clinical Goal(s):  Patient will work with LCSW to coordinate care for long-term memory care assisted living facility placement.   LCSW Interventions:  Collaboration with Primary Care Physician, Dr. [name] regarding development and update of comprehensive plan of care as evidenced by provider attestation and co-signature. Patient Goals/Self-Care Activities: Continue to receive weekly/bi-weekly calls from LCSW, in an effort to coordinate long-term memory care assisted living facility placement.  Decide whether or not you would like to reside [facility] in [facility], or if you would like to begin pursing alternate placement arrangements.  Bed offer received, but not accepted.  Please discuss with dual healthcare power's of attorney, and report findings to LCSW.   Contact LCSW directly (# [phone]) if you have questions, need assistance, or if additional social work needs are identified between now and our next scheduled telephone outreach call. Follow-Up Plan:  05/29/2021 at 10:30am     Danford Bad LCSW Licensed Clinical Social Worker [facility] [phone]

## 2021-05-16 ENCOUNTER — Telehealth: Payer: Self-pay | Admitting: Internal Medicine

## 2021-05-16 ENCOUNTER — Ambulatory Visit: Payer: Medicare PPO | Admitting: *Deleted

## 2021-05-16 DIAGNOSIS — G319 Degenerative disease of nervous system, unspecified: Secondary | ICD-10-CM

## 2021-05-16 DIAGNOSIS — M810 Age-related osteoporosis without current pathological fracture: Secondary | ICD-10-CM

## 2021-05-16 DIAGNOSIS — R413 Other amnesia: Secondary | ICD-10-CM

## 2021-05-16 DIAGNOSIS — I10 Essential (primary) hypertension: Secondary | ICD-10-CM

## 2021-05-16 DIAGNOSIS — F331 Major depressive disorder, recurrent, moderate: Secondary | ICD-10-CM

## 2021-05-16 DIAGNOSIS — F339 Major depressive disorder, recurrent, unspecified: Secondary | ICD-10-CM

## 2021-05-16 DIAGNOSIS — E7800 Pure hypercholesterolemia, unspecified: Secondary | ICD-10-CM

## 2021-05-16 NOTE — Chronic Care Management (AMB) (Signed)
Chronic Care Management    Clinical Social Work Note  05/16/2021 Name: Madison Glover MRN: [id number] DOB: 1951-01-28  Madison Glover is a 70 y.o. year old female who is a primary care patient of Madison Glover, Madison Patricia, MD. The CCM team was consulted to assist the patient with chronic disease management and/or care coordination needs related to: Level of Care Concerns.   Engaged with patient's dual healthcare power's of attorney by telephone for follow-up visit in response to provider referral for social work chronic care management and care coordination services.   Consent to Services:  The patient was given information about Chronic Care Management services, agreed to services, and gave verbal consent prior to initiation of services.  Please see initial visit note for detailed documentation.   Patient agreed to services and consent obtained.   Assessment: Review of patient past medical history, allergies, medications, and health status, including review of relevant consultants reports was performed today as part of a comprehensive evaluation and provision of chronic care management and care coordination services.     SDOH (Social Determinants of Health) assessments and interventions performed:    Advanced Directives Status: Not addressed in this encounter.  CCM Care Plan  Allergies  Allergen Reactions   Bee Venom Swelling    Outpatient Encounter Medications as of 05/16/2021  Medication Sig   alendronate (FOSAMAX) 70 MG tablet TAKE 1 TABLET BY MOUTH EVERY 7 DAYS WITH A FULL GLASS OF WATER ON AN EMPTY STOMACH. (Patient taking differently: Take 70 mg by mouth once a week.)   citalopram (CELEXA) 40 MG tablet Take 1 tablet (40 mg total) by mouth daily.   donepezil (ARICEPT) 10 MG tablet Take 1 tablet (10 mg total) by mouth at bedtime.   hydrochlorothiazide (HYDRODIURIL) 25 MG tablet TAKE 1 TABLET BY MOUTH EVERY DAY (Patient taking differently: Take 25 mg by mouth  daily.)   LOW-DOSE ASPIRIN PO Take 81 mg by mouth daily. Take 1 tablet daily   memantine (NAMENDA) 10 MG tablet Take 1 tablet (10 mg total) by mouth 2 (two) times daily.   metoprolol succinate (TOPROL-XL) 100 MG 24 hr tablet TAKE 1 TABLET BY MOUTH EVERY DAY   mupirocin ointment (BACTROBAN) 2 % Place 1 application into the nose 2 (two) times daily.   simvastatin (ZOCOR) 20 MG tablet TAKE 1 TABLET (20 MG TOTAL) BY MOUTH DAILY AT 6 PM.   vitamin B-12 (CYANOCOBALAMIN) 1000 MCG tablet Take 1,000 mcg by mouth daily.   No facility-administered encounter medications on file as of 05/16/2021.    Patient Active Problem List   Diagnosis Date Noted   MDD (major depressive disorder), recurrent episode, moderate (HCC)    Suicidal ideation    Neurodegenerative cognitive impairment (HCC) 11/06/2020   Positive colorectal cancer screening using Cologuard test    Encounter for screening colonoscopy 10/17/2020   Vitamin D deficiency 07/31/2020   Osteoporosis 05/02/2020   Hypertension    High cholesterol    Depression, recurrent (HCC)    Memory loss 08/02/2019   Vitamin B12 deficiency 08/02/2019    Conditions to be addressed/monitored: HTN and Dementia.  Limited Social Support, Level of Care Concerns, ADL/IADL Limitations, Mental Health Concerns, Family and Relationship Dysfunction, Social Isolation, Limited Access to Caregiver, Cognitive Deficits, Memory Deficits, and Lacks Knowledge of [employer].  Care Plan : LCSW Plan of Care  Updates made by Madison Stamps, LCSW since 05/16/2021 12:00 AM     Problem: Improve My Quality of Life through Assisted Living  Facility Placement.   Priority: High     Goal: Improve My Quality of Life through Assisted Living Facility Placement.   Start Date: 04/19/2021  Expected End Date: 06/19/2021  This Visit's Progress: On track  Recent Progress: On track  Priority: High  Note:   Current Barriers:  Patient with Dementia, Hypertension, Recurrent Major  Depressive Disorder, Moderate, Neurodegenerative Cognitive Impairment, Suicidal Ideation and Memory Loss needs Support, Education, Referrals, Resources and Assistance with obtaining a higher level of care.   Clinical Goal(s):  Patient will work with LCSW to coordinate care for long-term memory care assisted living facility placement.   LCSW Interventions:  Collaboration with Primary Care Physician, Dr. Chaya Glover regarding development and update of comprehensive plan of care as evidenced by provider attestation and co-signature. Collaboration with Primary Care Physician, Dr. Chaya Glover to request updated FL-2 Form, negative chest x-ray results (within the last 12 months) and copy of immunization records be faxed to [facility] Living Facility, for placement purposes, providing fax number (# [phone]). Patient Goals/Self-Care Activities: Continue to receive weekly/bi-weekly calls from LCSW, in an effort to coordinate long-term memory care assisted living facility placement.  Collaboration with Madison Glover, Admissions Coordinator at [employer] at [facility]isted Living Facility [phone]) to confirm bed offer received. Keep appointment with Madison Glover, Admissions Coordinator at [facility] at [facility]isted Living Facility, scheduled for 05/21/2021 at 10:00am, to tour the facility, meet some of the residents, discuss social events/activities, rooming, etc. Complete admissions paperwork for [facility] Living Facility, with the help of Dual Healthcare Power's of Madison Glover, Madison Glover and Madison Glover, and submit to [employer], Scientist, research (physical sciences) at [facility], during appointment scheduled for 05/21/2021 at 10:00am. Continue to attend weekly [employer] Meetings, for socialization, companionship and support. Contact LCSW directly (# [id number]) if you have questions,  need assistance, or if additional social work needs are identified between now and our next scheduled telephone outreach call. Follow-Up Plan:  05/24/2021 at 11:30am     Danford Bad LCSW Licensed Clinical Social Worker [facility] [phone]

## 2021-05-16 NOTE — Patient Instructions (Signed)
Visit Information  PATIENT GOALS:  Goals Addressed             This Visit's Progress    Improve My Quality of Life through Assisted Living Facility Placement.   On track    Timeframe:  Short-Term Goal Priority:  High Start Date:  04/19/2021                        Expected End Date:   06/19/2021                    Follow-Up Date:  05/24/2021 at 11:30am  Patient Goals/Self-Care Activities:  Continue to receive weekly/bi-weekly calls from LCSW, in an effort to coordinate long-term memory care assisted living facility placement.  Collaboration with [name], Admissions Coordinator at [employer] at [facility]isted Living Facility [phone]) to confirm bed offer received. Keep appointment with [name], Admissions Coordinator at [facility] at [facility]isted Living Facility, scheduled for 05/21/2021 at 10:00am, to tour the facility, meet some of the residents, discuss social events/activities, rooming, etc. Complete admissions paperwork for [facility] - [facility]isted Living Facility, with the help of Dual Healthcare Power's of [name], [name] and [name], and submit to [employer], Scientist, research (physical sciences) at [facility] at [facility], during appointment scheduled for 05/21/2021 at 10:00am. Continue to attend weekly [employer] Meetings, for socialization, companionship and support. Contact LCSW directly (# [id number]) if you have questions, need assistance, or if additional social work needs are identified between now and our next scheduled telephone outreach call.        Patient verbalizes understanding of instructions provided today and agrees to view in MyChart.   Telephone follow up appointment with care management team member scheduled for:  05/24/2021 at 11:30am  Danford Bad LCSW Licensed Clinical Social Worker [facility] [phone]

## 2021-05-16 NOTE — Telephone Encounter (Signed)
Patient would be going to an assisted living facility. The facility is requesting for her immunization records to be faxed over.  [facility] Fax number is [phone]. It could be attn to [employer].  Please advise.

## 2021-05-17 ENCOUNTER — Telehealth: Payer: Self-pay

## 2021-05-17 NOTE — Telephone Encounter (Signed)
[name] and [name] are medical power attorney and will discuss this with the patient

## 2021-05-17 NOTE — Telephone Encounter (Signed)
Spoke with Madison Glover.  Immunizations record was faxed.  We have not yet received the FL2 form.  She will call and ask for forms to be faxed to our office.

## 2021-05-17 NOTE — Telephone Encounter (Signed)
Patient called stating she does not want to go into residential facility and she is doing everything she can to stay home patient also stated that she has home health coming in twice a week. Patient asked if CMA would give her a call to discuss further.

## 2021-05-21 ENCOUNTER — Telehealth: Payer: Self-pay | Admitting: *Deleted

## 2021-05-21 ENCOUNTER — Telehealth: Payer: Self-pay | Admitting: Internal Medicine

## 2021-05-21 NOTE — Chronic Care Management (AMB) (Signed)
  Care Management   Note  05/21/2021 Name: [name] MRN: [id number] DOB: Nov 17, 1950  Madison Glover is a 70 y.o. year old female who is a primary care patient of Philip Aspen, Limmie Patricia, MD and is actively engaged with the care management team. I reached out to Madison Glover by phone today to assist with re-scheduling a follow up visit with the RN Case Manager  Follow up plan: Unsuccessful telephone outreach attempt made. The care management team will reach out to the patient again over the next 7 days. If patient returns call to provider office, please advise to call Embedded Care Management Care Guide Misty Stanley at [phone].  Gwenevere Ghazi  Care Guide, Embedded Care Coordination [facility] Management  Direct Dial: [phone]

## 2021-05-21 NOTE — Telephone Encounter (Addendum)
Pt friend doris has healthcare POA is calling to let dr Ardyth Harps know the patient did not take her morning meds before going to new facility [facility]  today and doris is calling to report patient had meltdown and patient states she is only going to be at [facility]  for 6 months. They signed  2 yrs contact. [name] and patient has an appt with social work Training and development officer  on 05-24-2021. Pt will be going to live at [facility] on 05-27-2021. Please advise

## 2021-05-22 ENCOUNTER — Ambulatory Visit: Payer: Medicare PPO | Admitting: *Deleted

## 2021-05-22 ENCOUNTER — Telehealth: Payer: Self-pay

## 2021-05-22 DIAGNOSIS — F331 Major depressive disorder, recurrent, moderate: Secondary | ICD-10-CM

## 2021-05-22 DIAGNOSIS — M810 Age-related osteoporosis without current pathological fracture: Secondary | ICD-10-CM

## 2021-05-22 DIAGNOSIS — I10 Essential (primary) hypertension: Secondary | ICD-10-CM

## 2021-05-22 DIAGNOSIS — G319 Degenerative disease of nervous system, unspecified: Secondary | ICD-10-CM

## 2021-05-22 DIAGNOSIS — F339 Major depressive disorder, recurrent, unspecified: Secondary | ICD-10-CM

## 2021-05-22 DIAGNOSIS — R413 Other amnesia: Secondary | ICD-10-CM

## 2021-05-22 NOTE — Telephone Encounter (Signed)
This information is just FYI sorry

## 2021-05-22 NOTE — Patient Instructions (Signed)
Visit Information  PATIENT GOALS:  Goals Addressed             This Visit's Progress    Improve My Quality of Life through Assisted Living Facility Placement.   On track    Timeframe:  Short-Term Goal Priority:  High Start Date:  04/19/2021                        Expected End Date:   06/19/2021                    Follow-Up Date:  05/24/2021 at 11:30am  Patient Goals/Self-Care Activities: Continue to receive weekly/bi-weekly calls from LCSW, in an effort to coordinate long-term care assisted living facility placement.  Meet with [name], Admissions Coordinator at [facility], on 05/21/2021 at 10:00am, to complete admissions paperwork. Urgent incoming call received from patient's sister-in-law, Clydie Braun Ridings, after hours last evening, indicating that patient was "out-of-control",  requesting advice on how to best handle her behavior.  Mrs. Ridings reported, "I believe she has had a psychotic break".  Mrs. Ridings went on to  explain that patient was very angry about having to move into an assisted living facility, argumentative with staff upon signing herself in,  combative with friends/dual healthcare power's of attorney, slapping, screaming and cursing at them, threatening to jump out of the car, etc.   Shortly thereafter, Mrs. Clarene Critchley discovered that patient had not been taking any of her psychotropic medications, nor had she filled her  prescriptions in over a month.   LCSW encouraged Mrs. Ridings to try and divert patient's attention, easing her out of her combative state, utilizing deep breathing exercises and relaxation techniques.  LCSW is aware that patient is very close to her pastor, encouraging Mrs. Ridings to contact the pastor to see if he were available and willing to meet with them.  After meeting with the pastor, Mrs. Clarene Critchley reported that patient's behavior was much improved and that "she appeared to be back to her normal self  again". Continue to attend weekly [employer] Meetings, for socialization, companionship and support. Contact LCSW directly (# [id number]) if you have questions, need assistance, or if additional social work needs are identified between now and our next scheduled telephone outreach call.        Patient verbalizes understanding of instructions provided today and agrees to view in MyChart.   Telephone follow up appointment with care management team member scheduled for:  05/24/2021 at 11:30am  Danford Bad LCSW Licensed Clinical Social Worker [facility] [phone]

## 2021-05-22 NOTE — Telephone Encounter (Signed)
Noted forms will be given to PCP once they are received.

## 2021-05-22 NOTE — Telephone Encounter (Signed)
[facility] called stating that they are faxing FL2 forms to be filled out by PCP

## 2021-05-22 NOTE — Chronic Care Management (AMB) (Signed)
Chronic Care Management    Clinical Social Work Note  05/22/2021 Name: Madison Glover MRN: [id number] DOB: 1951-04-28  Madison Glover is a 70 y.o. year old female who is a primary care patient of Philip Aspen, Limmie Patricia, MD. The CCM team was consulted to assist the patient with chronic disease management and/or care coordination needs related to: Level of Care Concerns, Mental Health Counseling and Resources, and Caregiver Stress.   Engaged with patient's sister-in-law by telephone for follow-up visit in response to provider referral for social work chronic care management and care coordination services.   Consent to Services:  The patient was given information about Chronic Care Management services, agreed to services, and gave verbal consent prior to initiation of services.  Please see initial visit note for detailed documentation.   Patient agreed to services and consent obtained.   Assessment: Review of patient past medical history, allergies, medications, and health status, including review of relevant consultants reports was performed today as part of a comprehensive evaluation and provision of chronic care management and care coordination services.     SDOH (Social Determinants of Health) assessments and interventions performed:    Advanced Directives Status: Not addressed in this encounter.  CCM Care Plan  Allergies  Allergen Reactions   Bee Venom Swelling    Outpatient Encounter Medications as of 05/22/2021  Medication Sig   alendronate (FOSAMAX) 70 MG tablet TAKE 1 TABLET BY MOUTH EVERY 7 DAYS WITH A FULL GLASS OF WATER ON AN EMPTY STOMACH. (Patient taking differently: Take 70 mg by mouth once a week.)   citalopram (CELEXA) 40 MG tablet Take 1 tablet (40 mg total) by mouth daily.   donepezil (ARICEPT) 10 MG tablet Take 1 tablet (10 mg total) by mouth at bedtime.   hydrochlorothiazide (HYDRODIURIL) 25 MG tablet TAKE 1 TABLET BY MOUTH EVERY DAY (Patient taking  differently: Take 25 mg by mouth daily.)   LOW-DOSE ASPIRIN PO Take 81 mg by mouth daily. Take 1 tablet daily   memantine (NAMENDA) 10 MG tablet Take 1 tablet (10 mg total) by mouth 2 (two) times daily.   metoprolol succinate (TOPROL-XL) 100 MG 24 hr tablet TAKE 1 TABLET BY MOUTH EVERY DAY   mupirocin ointment (BACTROBAN) 2 % Place 1 application into the nose 2 (two) times daily.   simvastatin (ZOCOR) 20 MG tablet TAKE 1 TABLET (20 MG TOTAL) BY MOUTH DAILY AT 6 PM.   vitamin B-12 (CYANOCOBALAMIN) 1000 MCG tablet Take 1,000 mcg by mouth daily.   No facility-administered encounter medications on file as of 05/22/2021.    Patient Active Problem List   Diagnosis Date Noted   MDD (major depressive disorder), recurrent episode, moderate (HCC)    Suicidal ideation    Neurodegenerative cognitive impairment (HCC) 11/06/2020   Positive colorectal cancer screening using Cologuard test    Encounter for screening colonoscopy 10/17/2020   Vitamin D deficiency 07/31/2020   Osteoporosis 05/02/2020   Hypertension    High cholesterol    Depression, recurrent (HCC)    Memory loss 08/02/2019   Vitamin B12 deficiency 08/02/2019    Conditions to be addressed/monitored: Anxiety and Dementia.  Level of Care Concerns, Mental Health Concerns, Family and Relationship Dysfunction, Limited Access to Caregiver, and Memory Deficits.  Care Plan : LCSW Plan of Care  Updates made by [name], LCSW since 05/22/2021 12:00 AM     Problem: Improve My Quality of Life through Assisted Living Facility Placement.   Priority: High  Goal: Improve My Quality of Life through Assisted Living Facility Placement.   Start Date: 04/19/2021  Expected End Date: 06/19/2021  This Visit's Progress: On track  Recent Progress: On track  Priority: High  Note:   Current Barriers:  Patient with Dementia, Hypertension, Recurrent Major Depressive Disorder, Moderate, Neurodegenerative Cognitive Impairment, Suicidal  Ideation and Memory Loss needs Support, Education, Referrals, Resources and Assistance with obtaining a higher level of care.   Clinical Goal(s):  Patient will work with LCSW to coordinate care for long-term care assisted living facility placement.   LCSW Interventions:  Collaboration with Primary Care Physician, Dr. [name] regarding development and update of comprehensive plan of care as evidenced by provider attestation and co-signature. Collaboration with Primary Care Physician, Dr. [name] to request updated FL-2 Form, negative chest x-ray results (within the last 12 months) and copy of immunization records be faxed to [facility], for placement purposes, providing fax number (# [phone]). Patient Goals/Self-Care Activities: Continue to receive weekly/bi-weekly calls from LCSW, in an effort to coordinate long-term care assisted living facility placement.  Meet with [name], Admissions Coordinator at [facility] at [facility], on 05/21/2021 at 10:00am, to complete admissions paperwork. Urgent incoming call received from patient's sister-in-law, Clydie Braun Ridings, after hours last evening, indicating that patient was "out-of-control",  requesting advice on how to best handle her behavior.  Mrs. Ridings reported, "I believe she has had a psychotic break".  Mrs. Ridings went on to  explain that patient was very angry about having to move into an assisted living facility, argumentative with staff upon signing herself in,  combative with friends/dual healthcare power's of attorney, slapping, screaming and cursing at them, threatening to jump out of the car, etc.   Shortly thereafter, Mrs. Clarene Critchley discovered that patient had not been taking any of her psychotropic medications, nor had she filled her  prescriptions in over a month.   LCSW encouraged Mrs. Ridings to try and divert patient's attention, easing  her out of her combative state, utilizing deep breathing exercises and relaxation techniques.  LCSW is aware that patient is very close to her pastor, encouraging Mrs. Ridings to contact the pastor to see if he were available and willing to meet with them.  After meeting with the pastor, Mrs. Clarene Critchley reported that patient's behavior was much improved and that "she appeared to be back to her normal self again". Continue to attend weekly [employer] Meetings, for socialization, companionship and support. Contact LCSW directly (# [id number]) if you have questions, need assistance, or if additional social work needs are identified between now and our next scheduled telephone outreach call. Follow-Up Plan:  05/24/2021 at 11:30am     Danford Bad LCSW Licensed Clinical Social Worker [facility] [phone]

## 2021-05-24 ENCOUNTER — Ambulatory Visit: Payer: Medicare PPO | Admitting: *Deleted

## 2021-05-24 DIAGNOSIS — R413 Other amnesia: Secondary | ICD-10-CM

## 2021-05-24 DIAGNOSIS — F329 Major depressive disorder, single episode, unspecified: Secondary | ICD-10-CM | POA: Diagnosis not present

## 2021-05-24 DIAGNOSIS — F322 Major depressive disorder, single episode, severe without psychotic features: Secondary | ICD-10-CM | POA: Diagnosis not present

## 2021-05-24 DIAGNOSIS — F03B3 Unspecified dementia, moderate, with mood disturbance: Secondary | ICD-10-CM | POA: Diagnosis not present

## 2021-05-24 DIAGNOSIS — I10 Essential (primary) hypertension: Secondary | ICD-10-CM | POA: Diagnosis not present

## 2021-05-24 DIAGNOSIS — F332 Major depressive disorder, recurrent severe without psychotic features: Secondary | ICD-10-CM | POA: Diagnosis not present

## 2021-05-24 DIAGNOSIS — G319 Degenerative disease of nervous system, unspecified: Secondary | ICD-10-CM

## 2021-05-24 DIAGNOSIS — E785 Hyperlipidemia, unspecified: Secondary | ICD-10-CM | POA: Diagnosis not present

## 2021-05-24 DIAGNOSIS — F0390 Unspecified dementia without behavioral disturbance: Secondary | ICD-10-CM | POA: Diagnosis not present

## 2021-05-24 DIAGNOSIS — M810 Age-related osteoporosis without current pathological fracture: Secondary | ICD-10-CM | POA: Diagnosis not present

## 2021-05-24 DIAGNOSIS — F339 Major depressive disorder, recurrent, unspecified: Secondary | ICD-10-CM | POA: Diagnosis not present

## 2021-05-24 DIAGNOSIS — E7800 Pure hypercholesterolemia, unspecified: Secondary | ICD-10-CM | POA: Diagnosis not present

## 2021-05-24 DIAGNOSIS — F03B0 Unspecified dementia, moderate, without behavioral disturbance, psychotic disturbance, mood disturbance, and anxiety: Secondary | ICD-10-CM | POA: Diagnosis not present

## 2021-05-24 DIAGNOSIS — R45851 Suicidal ideations: Secondary | ICD-10-CM | POA: Diagnosis not present

## 2021-05-24 DIAGNOSIS — E876 Hypokalemia: Secondary | ICD-10-CM | POA: Diagnosis not present

## 2021-05-24 DIAGNOSIS — F331 Major depressive disorder, recurrent, moderate: Secondary | ICD-10-CM | POA: Diagnosis not present

## 2021-05-24 DIAGNOSIS — Z7983 Long term (current) use of bisphosphonates: Secondary | ICD-10-CM | POA: Diagnosis not present

## 2021-05-24 DIAGNOSIS — Z91199 Patient's noncompliance with other medical treatment and regimen due to unspecified reason: Secondary | ICD-10-CM | POA: Diagnosis not present

## 2021-05-24 DIAGNOSIS — Z20822 Contact with and (suspected) exposure to covid-19: Secondary | ICD-10-CM | POA: Diagnosis not present

## 2021-05-24 DIAGNOSIS — R195 Other fecal abnormalities: Secondary | ICD-10-CM

## 2021-05-24 NOTE — Patient Instructions (Signed)
Visit Information  PATIENT GOALS:  Goals Addressed             This Visit's Progress    Improve My Quality of Life through Assisted Living Facility Placement.   On track    Timeframe:  Short-Term Goal Priority:  High Start Date:  04/19/2021                        Expected End Date:   06/19/2021                    Follow-Up Date:  05/28/2021 at 11:45am  Patient Goals/Self-Care Activities: Continue to receive weekly/bi-weekly calls from LCSW, in an effort to coordinate long-term care assisted living facility placement.  Friends and Dual Healthcare Power's of [name], [name] and [name] will be picking up updated FL-2 Form and Immunization Records from Primary Care Physician, Dr. Peggye Pitt Acosta's office, and hand-delivering them to [name], Admissions Coordinator with [facility] [phone]) at 11:30am on 05/24/2021. Friends and [employer] of [name], [name] and [name] will be picking you up from your home around 2:00pm on 05/24/2021 to transport you to [facility], where you will reside for long-term care services. Contact LCSW directly (# [id number]) if you have questions, need assistance, or if additional social work needs are identified between now and our next scheduled telephone outreach call.        Patient verbalizes understanding of instructions provided today and agrees to view in MyChart.   Telephone follow up appointment with care management team member scheduled for:05/28/2021 at 11:45am  Danford Bad LCSW Licensed Clinical Social Worker [facility] [phone]

## 2021-05-24 NOTE — Chronic Care Management (AMB) (Signed)
Chronic Care Management    Clinical Social Work Note  05/24/2021 Name: Madison Glover MRN: [id number] DOB: 03-28-1951  Madison Glover is a 70 y.o. year old female who is a primary care patient of Philip Aspen, Limmie Patricia, MD. The CCM team was consulted to assist the patient with chronic disease management and/or care coordination needs related to: Level of Care Concerns, Mental Health Counseling and Resources, and Caregiver Stress.   Engaged with patient's friends/dual healthcare power's of attorney by telephone for follow-up visit in response to provider referral for social work chronic care management and care coordination services.   Consent to Services:  The patient was given information about Chronic Care Management services, agreed to services, and gave verbal consent prior to initiation of services.  Please see initial visit note for detailed documentation.   Patient agreed to services and consent obtained.   Assessment: Review of patient past medical history, allergies, medications, and health status, including review of relevant consultants reports was performed today as part of a comprehensive evaluation and provision of chronic care management and care coordination services.     SDOH (Social Determinants of Health) assessments and interventions performed:    Advanced Directives Status: Not addressed in this encounter.  CCM Care Plan  Allergies  Allergen Reactions   Bee Venom Swelling    Outpatient Encounter Medications as of 05/24/2021  Medication Sig   alendronate (FOSAMAX) 70 MG tablet TAKE 1 TABLET BY MOUTH EVERY 7 DAYS WITH A FULL GLASS OF WATER ON AN EMPTY STOMACH. (Patient taking differently: Take 70 mg by mouth once a week.)   citalopram (CELEXA) 40 MG tablet Take 1 tablet (40 mg total) by mouth daily.   donepezil (ARICEPT) 10 MG tablet Take 1 tablet (10 mg total) by mouth at bedtime.   hydrochlorothiazide (HYDRODIURIL) 25 MG tablet TAKE 1 TABLET BY  MOUTH EVERY DAY (Patient taking differently: Take 25 mg by mouth daily.)   LOW-DOSE ASPIRIN PO Take 81 mg by mouth daily. Take 1 tablet daily   memantine (NAMENDA) 10 MG tablet Take 1 tablet (10 mg total) by mouth 2 (two) times daily.   metoprolol succinate (TOPROL-XL) 100 MG 24 hr tablet TAKE 1 TABLET BY MOUTH EVERY DAY   mupirocin ointment (BACTROBAN) 2 % Place 1 application into the nose 2 (two) times daily.   simvastatin (ZOCOR) 20 MG tablet TAKE 1 TABLET (20 MG TOTAL) BY MOUTH DAILY AT 6 PM.   vitamin B-12 (CYANOCOBALAMIN) 1000 MCG tablet Take 1,000 mcg by mouth daily.   No facility-administered encounter medications on file as of 05/24/2021.    Patient Active Problem List   Diagnosis Date Noted   MDD (major depressive disorder), recurrent episode, moderate (HCC)    Suicidal ideation    Neurodegenerative cognitive impairment (HCC) 11/06/2020   Positive colorectal cancer screening using Cologuard test    Encounter for screening colonoscopy 10/17/2020   Vitamin D deficiency 07/31/2020   Osteoporosis 05/02/2020   Hypertension    High cholesterol    Depression, recurrent (HCC)    Memory loss 08/02/2019   Vitamin B12 deficiency 08/02/2019    Conditions to be addressed/monitored: HTN and Dementia.  Limited Social Support, Level of Care Concerns, ADL/IADL Limitations, Mental Health Concerns, Social Isolation, Limited Access to Caregiver, Cognitive Deficits, and Memory Deficits.  Care Plan : LCSW Plan of Care  Updates made by [name], LCSW since 05/24/2021 12:00 AM     Problem: Improve My Quality of Life through Assisted Living Facility  Placement.   Priority: High     Goal: Improve My Quality of Life through Assisted Living Facility Placement.   Start Date: 04/19/2021  Expected End Date: 06/19/2021  This Visit's Progress: On track  Recent Progress: On track  Priority: High  Note:   Current Barriers:  Patient with Dementia, Hypertension, Recurrent Major Depressive  Disorder, Moderate, Neurodegenerative Cognitive Impairment, Suicidal Ideation and Memory Loss needs Support, Education, Referrals, Resources and Assistance with obtaining a higher level of care.   Clinical Goal(s):  Patient will work with LCSW to coordinate care for long-term care assisted living facility placement.   LCSW Interventions:  Collaboration with Primary Care Physician, Dr. [name] regarding development and update of comprehensive plan of care as evidenced by provider attestation and co-signature. Patient Goals/Self-Care Activities: Continue to receive weekly/bi-weekly calls from LCSW, in an effort to coordinate long-term care assisted living facility placement.  Friends and Dual Healthcare Power's of [name], [name] and [name] will be picking up updated FL-2 Form and Immunization Records from Primary Care Physician, Dr. Peggye Pitt Acosta's office, and hand-delivering them to [name], Admissions Coordinator with [facility] [phone]) at 11:30am on 05/24/2021. Friends and [employer] of [name], [name] and [name] will be picking you up from your home around 2:00pm on 05/24/2021 to transport you to [facility], where you will reside for long-term care services. Contact LCSW directly (# [phone]) if you have questions, need assistance, or if additional social work needs are identified between now and our next scheduled telephone outreach call. Follow-Up Plan:  05/28/2021 at 11:45am     Danford Bad LCSW Licensed Clinical Social Worker [facility] [phone]

## 2021-05-27 ENCOUNTER — Other Ambulatory Visit: Payer: Medicare PPO

## 2021-05-27 ENCOUNTER — Telehealth: Payer: Medicare PPO

## 2021-05-27 DIAGNOSIS — F331 Major depressive disorder, recurrent, moderate: Secondary | ICD-10-CM

## 2021-05-27 DIAGNOSIS — F339 Major depressive disorder, recurrent, unspecified: Secondary | ICD-10-CM | POA: Diagnosis not present

## 2021-05-27 DIAGNOSIS — M810 Age-related osteoporosis without current pathological fracture: Secondary | ICD-10-CM

## 2021-05-27 DIAGNOSIS — E7800 Pure hypercholesterolemia, unspecified: Secondary | ICD-10-CM | POA: Diagnosis not present

## 2021-05-27 DIAGNOSIS — I10 Essential (primary) hypertension: Secondary | ICD-10-CM

## 2021-05-28 ENCOUNTER — Ambulatory Visit (INDEPENDENT_AMBULATORY_CARE_PROVIDER_SITE_OTHER): Payer: Medicare PPO | Admitting: *Deleted

## 2021-05-28 DIAGNOSIS — R413 Other amnesia: Secondary | ICD-10-CM

## 2021-05-28 DIAGNOSIS — F331 Major depressive disorder, recurrent, moderate: Secondary | ICD-10-CM

## 2021-05-28 DIAGNOSIS — M810 Age-related osteoporosis without current pathological fracture: Secondary | ICD-10-CM

## 2021-05-28 DIAGNOSIS — F339 Major depressive disorder, recurrent, unspecified: Secondary | ICD-10-CM

## 2021-05-28 DIAGNOSIS — G319 Degenerative disease of nervous system, unspecified: Secondary | ICD-10-CM

## 2021-05-28 DIAGNOSIS — I10 Essential (primary) hypertension: Secondary | ICD-10-CM

## 2021-05-28 DIAGNOSIS — R45851 Suicidal ideations: Secondary | ICD-10-CM

## 2021-05-28 NOTE — Chronic Care Management (AMB) (Signed)
Chronic Care Management    Clinical Social Work Note  05/28/2021 Name: Madison Glover MRN: [id number] DOB: 06/13/1951  Madison Glover is a 70 y.o. year old female who is a primary care patient of Philip Aspen, Limmie Patricia, MD. The CCM team was consulted to assist the patient with chronic disease management and/or care coordination needs related to: Level of Care Concerns, Mental Health Counseling and Resources, and Caregiver Stress.   Engaged with patient's friend/dual healthcare power of attorney by telephone for follow-up visit in response to provider referral for social work chronic care management and care coordination services.   Consent to Services:  The patient was given information about Chronic Care Management services, agreed to services, and gave verbal consent prior to initiation of services.  Please see initial visit note for detailed documentation.   Patient agreed to services and consent obtained.   Assessment: Review of patient past medical history, allergies, medications, and health status, including review of relevant consultants reports was performed today as part of a comprehensive evaluation and provision of chronic care management and care coordination services.     SDOH (Social Determinants of Health) assessments and interventions performed:    Advanced Directives Status: Not addressed in this encounter.  CCM Care Plan  Allergies  Allergen Reactions   Bee Venom Swelling    Outpatient Encounter Medications as of 05/28/2021  Medication Sig   alendronate (FOSAMAX) 70 MG tablet TAKE 1 TABLET BY MOUTH EVERY 7 DAYS WITH A FULL GLASS OF WATER ON AN EMPTY STOMACH. (Patient taking differently: Take 70 mg by mouth once a week.)   citalopram (CELEXA) 40 MG tablet Take 1 tablet (40 mg total) by mouth daily.   donepezil (ARICEPT) 10 MG tablet Take 1 tablet (10 mg total) by mouth at bedtime.   hydrochlorothiazide (HYDRODIURIL) 25 MG tablet TAKE 1 TABLET BY MOUTH  EVERY DAY (Patient taking differently: Take 25 mg by mouth daily.)   LOW-DOSE ASPIRIN PO Take 81 mg by mouth daily. Take 1 tablet daily   memantine (NAMENDA) 10 MG tablet Take 1 tablet (10 mg total) by mouth 2 (two) times daily.   metoprolol succinate (TOPROL-XL) 100 MG 24 hr tablet TAKE 1 TABLET BY MOUTH EVERY DAY   mupirocin ointment (BACTROBAN) 2 % Place 1 application into the nose 2 (two) times daily.   simvastatin (ZOCOR) 20 MG tablet TAKE 1 TABLET (20 MG TOTAL) BY MOUTH DAILY AT 6 PM.   vitamin B-12 (CYANOCOBALAMIN) 1000 MCG tablet Take 1,000 mcg by mouth daily.   [DISCONTINUED] diazepam (VALIUM) tablet    No facility-administered encounter medications on file as of 05/28/2021.    Patient Active Problem List   Diagnosis Date Noted   MDD (major depressive disorder), recurrent episode, moderate (HCC)    Suicidal ideation    Neurodegenerative cognitive impairment (HCC) 11/06/2020   Positive colorectal cancer screening using Cologuard test    Encounter for screening colonoscopy 10/17/2020   Vitamin D deficiency 07/31/2020   Osteoporosis 05/02/2020   Hypertension    High cholesterol    Depression, recurrent (HCC)    Memory loss 08/02/2019   Vitamin B12 deficiency 08/02/2019    Conditions to be addressed/monitored: Anxiety, Depression, and Dementia.  Limited Social Support, Level of Care Concerns, Medication Procurement, Mental Health Concerns, Family and Relationship Dysfunction, Social Isolation, Limited Access to Caregiver, Cognitive Deficits, Memory Deficits, and Lacks Knowledge of [employer].  Care Plan : LCSW Plan of Care  Updates made by Denetria Luevanos, [name], LCSW since  05/28/2021 12:00 AM     Problem: Improve My Quality of Life through Assisted Living Facility Placement.   Priority: High     Goal: Improve My Quality of Life through Assisted Living Facility Placement.   Start Date: 04/19/2021  Expected End Date: 06/19/2021  This Visit's Progress: Not on track   Recent Progress: On track  Priority: High  Note:   Current Barriers:  Patient with Dementia, Hypertension, Recurrent Major Depressive Disorder, Moderate, Neurodegenerative Cognitive Impairment, Suicidal Ideation and Memory Loss needs Support, Education, Referrals, Resources and Assistance with obtaining a higher level of care.   Clinical Goal(s):  Patient will work with LCSW to coordinate care for long-term care assisted living facility placement.   LCSW Interventions:  Collaboration with Primary Care Physician, Dr. [name] regarding development and update of comprehensive plan of care as evidenced by provider attestation and co-signature. Patient Goals/Self-Care Activities: Continue to receive weekly/bi-weekly calls from LCSW, in an effort to coordinate long-term care assisted living facility placement.  LCSW collaboration with [employer], [name], who reported that patient is currently hospitalized on the Geriatric/Psychiatric Unit at [facility], and has been since 05/25/2021, for Depression and Suicidal Ideation.  The following information was also obtained from [name]: Friend/Dual Healthcare Power of Attorney, [name] went to pick patient up from her home around 2:00pm on 05/24/2021, as scheduled, to  transport her to [facility], where she agreed to reside for long-term care services, but patient was  nowhere to be found.  [name] sent a text message to LCSW requesting guidance.  LCSW encouraged [name] to file a missing persons  report with the [facility], explaining that patient has memory deficits, and is a danger to herself and others.  [name] was  further encouraged to file Involuntary Commitment Paperwork with the Dagoberto Reef of Court at the [facility], as well as contact all  local hospitals and urgent  care facilities to check for admissions.  Around 5:30pm on 05/24/2021, patient was found hiding behind a stack of  boxes in her garage, inappropriately dressed, not having eaten or taken any of her prescription medications, and refusing placement.  Mrs.  Sherilyn Cooter waited with patient at her home until a sheriff arrived to transport her to [facility], for  medical clearance and psychiatric evaluation and treatment.   LCSW collaboration with [place], Press photographer on the Geriatric/Psychiatric Unit at [employer] [facility], to discuss changes to patient's psychotropic medication regimen.  Sleep medication prescribed and anti-anxiety medication dose increased. LCSW collaboration with Karena Addison, Therapist, sports at [facility], to perform psychiatric evaluation and mini-mental status exam, as well as determine appropriateness for inpatient psychiatric hospital treatment.    Follow-up appointments scheduled with a psychiatrist and a therapist at [facility]), for medication management  and psychotherapeutic services, and will be provided to patient on a discharge instruction sheet at time of discharge.     LCSW collaboration with Leodis Rains at [facility], to discuss patient's treatment plan while hospitalized.  Tentative discharge date scheduled for 05/29/2021, if [facility] will agree to accept patient for  admissions into their assisted living facility - awaiting response. Contact LCSW  directly (# [phone]) if you have questions, need assistance, or if additional social work needs are identified between now and our next scheduled telephone outreach call. Follow-Up Plan:  05/31/2021 at 1:15pm     Danford Bad LCSW Licensed Clinical Social  Worker [facility] [phone]

## 2021-05-28 NOTE — Patient Instructions (Addendum)
Visit Information  Patient verbalizes understanding of instructions provided today and agrees to view in MyChart.   Telephone follow up appointment with care management team member scheduled for:  05/31/2021 at 1:15pm  Danford Bad LCSW Licensed Clinical Social Worker [facility] [phone]

## 2021-05-29 ENCOUNTER — Telehealth: Payer: Medicare PPO

## 2021-05-29 NOTE — Chronic Care Management (AMB) (Signed)
  Care Management   Note  05/29/2021 Name: [name] MRN: [id number] DOB: [birth year]  Madison Glover Menges is a 70 y.o. year old female who is a primary care patient of Philip Aspen, Limmie Patricia, MD and is actively engaged with the care management team. I reached out to Madison Glover Graffam by phone today to assist with re-scheduling a follow up visit with the RN Case Manager  Follow up plan: Telephone appointment with care management team member scheduled for:06/07/21  [facility] Guide, Embedded Care Coordination [facility] Health  Care Management  Direct Dial: [phone]

## 2021-05-30 ENCOUNTER — Telehealth: Payer: Medicare PPO

## 2021-05-31 ENCOUNTER — Ambulatory Visit: Payer: Medicare PPO | Admitting: *Deleted

## 2021-05-31 DIAGNOSIS — R413 Other amnesia: Secondary | ICD-10-CM

## 2021-05-31 DIAGNOSIS — G319 Degenerative disease of nervous system, unspecified: Secondary | ICD-10-CM

## 2021-05-31 DIAGNOSIS — F331 Major depressive disorder, recurrent, moderate: Secondary | ICD-10-CM

## 2021-05-31 DIAGNOSIS — I10 Essential (primary) hypertension: Secondary | ICD-10-CM

## 2021-05-31 DIAGNOSIS — F339 Major depressive disorder, recurrent, unspecified: Secondary | ICD-10-CM

## 2021-05-31 DIAGNOSIS — M810 Age-related osteoporosis without current pathological fracture: Secondary | ICD-10-CM

## 2021-06-02 NOTE — Patient Instructions (Signed)
Visit Information  Patient verbalizes understanding of instructions provided today and agrees to view in MyChart.   Telephone follow up appointment with care management team member scheduled for:  06/12/2021 at 1:45pm  Danford Bad LCSW Licensed Clinical Social Worker [facility] [phone]

## 2021-06-02 NOTE — Chronic Care Management (AMB) (Signed)
Chronic Care Management    Clinical Social Work Note  06/02/2021 Name: Madison Glover MRN: [id number] DOB: 1951/04/28  Madison Glover is a 70 y.o. year old female who is a primary care patient of Philip Aspen, Limmie Patricia, MD. The CCM team was consulted to assist the patient with chronic disease management and/or care coordination needs related to: Level of Care Concerns.   Engaged with patient's friend/dual healthcare power of attorney by telephone for follow-up visit in response to provider referral for social work chronic care management and care coordination services.   Consent to Services:  The patient was given information about Chronic Care Management services, agreed to services, and gave verbal consent prior to initiation of services.  Please see initial visit note for detailed documentation.   Patient agreed to services and consent obtained.   Assessment: Review of patient past medical history, allergies, medications, and health status, including review of relevant consultants reports was performed today as part of a comprehensive evaluation and provision of chronic care management and care coordination services.     SDOH (Social Determinants of Health) assessments and interventions performed:    Advanced Directives Status: Not addressed in this encounter.  CCM Care Plan  Allergies  Allergen Reactions   Bee Venom Swelling    Outpatient Encounter Medications as of 05/31/2021  Medication Sig   alendronate (FOSAMAX) 70 MG tablet TAKE 1 TABLET BY MOUTH EVERY 7 DAYS WITH A FULL GLASS OF WATER ON AN EMPTY STOMACH. (Patient taking differently: Take 70 mg by mouth once a week.)   citalopram (CELEXA) 40 MG tablet Take 1 tablet (40 mg total) by mouth daily.   donepezil (ARICEPT) 10 MG tablet Take 1 tablet (10 mg total) by mouth at bedtime.   hydrochlorothiazide (HYDRODIURIL) 25 MG tablet TAKE 1 TABLET BY MOUTH EVERY DAY (Patient taking differently: Take 25 mg by mouth  daily.)   LOW-DOSE ASPIRIN PO Take 81 mg by mouth daily. Take 1 tablet daily   memantine (NAMENDA) 10 MG tablet Take 1 tablet (10 mg total) by mouth 2 (two) times daily.   metoprolol succinate (TOPROL-XL) 100 MG 24 hr tablet TAKE 1 TABLET BY MOUTH EVERY DAY   mupirocin ointment (BACTROBAN) 2 % Place 1 application into the nose 2 (two) times daily.   simvastatin (ZOCOR) 20 MG tablet TAKE 1 TABLET (20 MG TOTAL) BY MOUTH DAILY AT 6 PM.   vitamin B-12 (CYANOCOBALAMIN) 1000 MCG tablet Take 1,000 mcg by mouth daily.   No facility-administered encounter medications on file as of 05/31/2021.    Patient Active Problem List   Diagnosis Date Noted   MDD (major depressive disorder), recurrent episode, moderate (HCC)    Suicidal ideation    Neurodegenerative cognitive impairment (HCC) 11/06/2020   Positive colorectal cancer screening using Cologuard test    Encounter for screening colonoscopy 10/17/2020   Vitamin D deficiency 07/31/2020   Osteoporosis 05/02/2020   Hypertension    High cholesterol    Depression, recurrent (HCC)    Memory loss 08/02/2019   Vitamin B12 deficiency 08/02/2019    Conditions to be addressed/monitored: HTN and Dementia.  Cognitive Deficits and Memory Deficits.  Care Plan : LCSW Plan of Care  Updates made by [name], LCSW since 06/02/2021 12:00 AM     Problem: Improve My Quality of Life through Assisted Living Facility Placement.   Priority: High     Goal: Improve My Quality of Life through Assisted Living Facility Placement.   Start Date: 04/19/2021  Expected End Date: 06/19/2021  This Visit's Progress: On track  Recent Progress: Not on track  Priority: High  Note:   Current Barriers:  Patient with Dementia, Hypertension, Recurrent Major Depressive Disorder, Moderate, Neurodegenerative Cognitive Impairment, Suicidal Ideation and Memory Loss needs Support, Education, Referrals, Resources and Assistance with obtaining a higher level of care.   Clinical  Goal(s):  Patient will work with LCSW to coordinate care for long-term care assisted living facility placement.   LCSW Interventions:  Collaboration with Primary Care Physician, Dr. [name] regarding development and update of comprehensive plan of care as evidenced by provider attestation and co-signature. Patient Goals/Self-Care Activities: LCSW collaboration with [employer] [place], [name], to confirm long-term care placement at [facility], as of 05/31/2021.  [name] reported that you are adjusting nicely, interacting appropriately with other residents, and attending meals in the dining hall. LCSW collaboration with Karena Addison, Therapist, sports at [facility], to confirm that follow-up psychiatric services have not been arranged for you, nor has the mandatory psychological evaluation been scheduled with a provider.   LCSW collaboration with Primary Care Physician, Dr. [name] to request referral to psychiatrist or psychologist, for mandatory psychological evaluation clearance. LCSW collaboration with [name], Scientist, research (physical sciences) at [employer] [facility], to confirm that a psychological  evaluation must be completed within the next 30 days, and medical clearance established, for you to be able to continue to reside  at [facility].    Contact LCSW directly (# [phone]) if you have questions, need assistance, or if additional social work needs are identified between now and our next scheduled telephone outreach call. Follow-Up Plan:  06/12/2021 at 1:45pm     Danford Bad LCSW Licensed Clinical Social Worker [facility] [phone]

## 2021-06-06 ENCOUNTER — Other Ambulatory Visit: Payer: Self-pay | Admitting: Internal Medicine

## 2021-06-06 ENCOUNTER — Ambulatory Visit: Payer: Medicare PPO | Admitting: Internal Medicine

## 2021-06-06 DIAGNOSIS — G319 Degenerative disease of nervous system, unspecified: Secondary | ICD-10-CM

## 2021-06-07 ENCOUNTER — Ambulatory Visit: Payer: Medicare PPO

## 2021-06-07 DIAGNOSIS — I10 Essential (primary) hypertension: Secondary | ICD-10-CM

## 2021-06-07 DIAGNOSIS — R413 Other amnesia: Secondary | ICD-10-CM

## 2021-06-07 NOTE — Chronic Care Management (AMB) (Signed)
Chronic Care Management   CCM RN Visit Note  06/07/2021 Name: Madison Glover MRN: [id number] DOB: [birth year]  Subjective: Madison Glover is a 70 y.o. year old female who is a primary care patient of Philip Aspen, Limmie Patricia, MD. The care management team was consulted for assistance with disease management and care coordination needs.    Engaged with patient by telephone for follow up visit in response to provider referral for case management and/or care coordination services.   Consent to Services:  The patient was given information about Chronic Care Management services, agreed to services, and gave verbal consent prior to initiation of services.  Please see initial visit note for detailed documentation.   Patient agreed to services and verbal consent obtained.   Assessment: Review of patient past medical history, allergies, medications, health status, including review of consultants reports, laboratory and other test data, was performed as part of comprehensive evaluation and provision of chronic care management services.   SDOH (Social Determinants of Health) assessments and interventions performed:    CCM Care Plan  Allergies  Allergen Reactions   Bee Venom Swelling    Outpatient Encounter Medications as of 06/07/2021  Medication Sig   alendronate (FOSAMAX) 70 MG tablet TAKE 1 TABLET BY MOUTH EVERY 7 DAYS WITH A FULL GLASS OF WATER ON AN EMPTY STOMACH. (Patient taking differently: Take 70 mg by mouth once a week.)   citalopram (CELEXA) 40 MG tablet Take 1 tablet (40 mg total) by mouth daily.   donepezil (ARICEPT) 10 MG tablet Take 1 tablet (10 mg total) by mouth at bedtime.   hydrochlorothiazide (HYDRODIURIL) 25 MG tablet TAKE 1 TABLET BY MOUTH EVERY DAY (Patient taking differently: Take 25 mg by mouth daily.)   LOW-DOSE ASPIRIN PO Take 81 mg by mouth daily. Take 1 tablet daily   memantine (NAMENDA) 10 MG tablet Take 1 tablet (10 mg total) by mouth 2 (two) times  daily.   metoprolol succinate (TOPROL-XL) 100 MG 24 hr tablet TAKE 1 TABLET BY MOUTH EVERY DAY   mupirocin ointment (BACTROBAN) 2 % Place 1 application into the nose 2 (two) times daily.   simvastatin (ZOCOR) 20 MG tablet TAKE 1 TABLET (20 MG TOTAL) BY MOUTH DAILY AT 6 PM.   vitamin B-12 (CYANOCOBALAMIN) 1000 MCG tablet Take 1,000 mcg by mouth daily.   No facility-administered encounter medications on file as of 06/07/2021.    Patient Active Problem List   Diagnosis Date Noted   MDD (major depressive disorder), recurrent episode, moderate (HCC)    Suicidal ideation    Neurodegenerative cognitive impairment (HCC) 11/06/2020   Positive colorectal cancer screening using Cologuard test    Encounter for screening colonoscopy 10/17/2020   Vitamin D deficiency 07/31/2020   Osteoporosis 05/02/2020   Hypertension    High cholesterol    Depression, recurrent (HCC)    Memory loss 08/02/2019   Vitamin B12 deficiency 08/02/2019    Conditions to be addressed/monitored:HTN and Dementia  Care Plan : RNCM:Dementia (Adult)  Updates made by Yetta Glassman, RN since 06/07/2021 12:00 AM     Problem: Potential of Harm or Injury related to dementia   Priority: High     Long-Range Goal: Harm or Injury Prevented related to dementia   Start Date: 02/18/2021  Expected End Date: 07/28/2021  This Visit's Progress: On track  Recent Progress: On track  Priority: High  Note:   Current Barriers:  Ineffective Self Health Maintenance in a patient with Dementia Unable to independently self manage  dementia Unable to self administer medications as prescribed Unable to perform IADLs independently Spoke with [employer] [name] friend.  States pt has been at assisted living for a week now and she is settling in well.  States she is involved with activities at the facility Pt is waiting to get psychiatric evaluation that provider ordered.  States they do not know if they will change her primary care to the  group that visits the facility. Clinical Goal(s):  Collaboration with Philip Aspen, Limmie Patricia, MD regarding development and update of comprehensive plan of care as evidenced by provider attestation and co-signature Inter-disciplinary care team collaboration (see longitudinal plan of care) patient will work with care management team to address care coordination and chronic disease management needs related to Disease Management Educational Needs Care Coordination Medication Management and Education Psychosocial Support Caregiver Stress support Dementia and Caregiver Support Level of Care Concerns   Interventions:  Evaluation of current treatment plan related to HTN, HLD, and Dementia, Level of care concerns, ADL IADL limitations, Family and relationship dysfunction, Cognitive Deficits, Memory Deficits, Inability to perform IADL's independently, and Lacks knowledge of community resource: dementia  self-management and patient's adherence to plan as established by provider. Collaboration with Philip Aspen, Limmie Patricia, MD regarding development and update of comprehensive plan of care as evidenced by provider attestation       and co-signature Inter-disciplinary care team collaboration (see longitudinal plan of care) Discussed plans with patient for ongoing care management follow up and provided patient with direct contact information for care management team Referred to CCM social works for level of care issues, caregiver stress and resources-LCSW working with pt/caregivers now in assisted living facility in [place] now- LCSW continues to follow  Reviewed to get more information on facility providers and how her insurance would cover before making decision to change providers Self Care Activities:  Self administers medications as prescribed Attends all scheduled provider appointments Calls pharmacy for medication refills Attends church or other social activities Calls provider office for new  concerns or questions Patient Goals: - attend dementia support group - check out dementia website - check out other places when staying at home is no longer possible (assisted living center, nursing home) - check out services like in-home help or adult day care - check with a financial planner - connect with the local or national dementia organization - hold a family meeting with family members and loved ones - list the symptoms that would make staying at home too hard - make a list of future care and financial needs - make a list of people who can help and what they can do Follow Up Plan: Telephone follow up appointment with care management team member scheduled for: 07/15/21  The patient has been provided with contact information for the care management team and has been advised to call with any health related questions or concerns.      Care Plan : RNCM:Cardiovascular disease (HTN and HLD)  Updates made by Yetta Glassman, RN since 06/07/2021 12:00 AM     Problem: Lack of long term Cardiovascular disease Management (HTN and HLD)   Priority: Medium     Long-Range Goal: Effective Cardiovascular disease Self Management (HTN and HLD)   Start Date: 02/18/2021  Expected End Date: 07/28/2021  This Visit's Progress: On track  Recent Progress: On track  Priority: Medium  Note:   Current Barriers:  Knowledge Deficits related to basic understanding of Cardiovascular disease (HTN and HLD) pathophysiology and self care  management Unable to independently Self manage Cardiovascular disease (HTN and HLD) Unable to self administer medications as prescribed Unable to perform IADLs independently Spoke with [employer] [name] friends.Caregiver states that assisted living facility is making sure pt is taking her medications,checking her B/P and making sure she eats healthy Nurse Case Manager Clinical Goal(s):  patient will verbalize understanding of plan for hypertension management patient will  attend all scheduled medical appointments:CCM LCSW 06/12/21  patient will demonstrate improved adherence to prescribed treatment plan for hypertension as evidenced by taking all medications as prescribed, monitoring and recording blood pressure as directed, adhering to low sodium/DASH diet patient will demonstrate improved health management independence as evidenced by checking blood pressure as directed and notifying PCP if SBP>160 or DBP > 90, taking all medications as prescribe, and adhering to a low sodium diet as discussed. patient will verbalize basic understanding of hypertension disease process and self health management plan as evidenced by readings within limits and adherence to medications  Interventions:  Collaboration with Philip Aspen, Limmie Patricia, MD regarding development and update of comprehensive plan of care as evidenced by provider attestation and co-signature Inter-disciplinary care team collaboration (see longitudinal plan of care) Evaluation of current treatment plan related to hypertension self management and patient's adherence to plan as established by provider. REinforced education to patient re: stroke prevention, s/s of heart attack and stroke, DASH diet, complications of uncontrolled blood pressure Reviewed medications with patient and discussed importance of compliance Discussed plans with patient for ongoing care management follow up and provided patient with direct contact information for care management team Reinforced with caregiver to monitor blood pressure 1-2 times a week and record, calling PCP for findings outside established parameters.  Self-Care Activities:  Self administers medications as prescribed Attends all scheduled provider appointments Calls pharmacy for medication refills Attends church or other social activities Calls provider office for new concerns or questions Patient Goals  - Self administer medications as prescribed  - Attend all  scheduled provider appointments  - Call provider office for new concerns, questions, or BP outside discussed parameters  - Check BP and record as discussed  - Follows a low sodium diet/DASH diet - check blood pressure weekly - choose a place to take my blood pressure (home, clinic or office, retail store) - write blood pressure results in a log or diary - ask questions to understand Follow Up Plan: Telephone follow up appointment with care management team member scheduled for: 07/15/21 The patient has been provided with contact information for the care management team and has been advised to call with any health related questions or concerns.       Plan:Telephone follow up appointment with care management team member scheduled for:  07/15/21 The patient has been provided with contact information for the care management team and has been advised to call with any health related questions or concerns.  Dudley Major RN, Maximiano Coss, CDE Care Management Coordinator [facility] [phone], Mobile [phone]

## 2021-06-07 NOTE — Patient Instructions (Addendum)
Visit Information  Goals Addressed             This Visit's Progress    RNCM:Planning for Long-Term Care-Dementia   On track    Timeframe:  Long-Range Goal Priority:  High Start Date:      02/18/21                       Expected End Date:      07/28/21                 Follow Up Date 07/15/21    - attend dementia support group - check out dementia website - check out other places when staying at home is no longer possible (assisted living center, nursing home) - check out services like in-home help or adult day care - check with a financial planner - connect with the local or national dementia organization - hold a family meeting with family members and loved ones - list the symptoms that would make staying at home too hard - make a list of future care and financial needs - make a list of people who can help and what they can do    Why is this important?   Learning that you or your loved one has dementia can be scary and stressful.  You can reduce stress by planning.  Preparing for the future is one of the most important things to do.  Thinking about how much care you/your loved one will need and how much it will cost is not easy.  Early on, you/your loved one can be part of making decisions for the future.  Making sure that your/your loved one's wishes for care are known is important.     Notes:      RNCM:Track and Manage My Blood Pressure-Hypertension   On track    Timeframe:  Long-Range Goal Priority:  Medium Start Date:        02/18/21                     Expected End Date:     07/28/21                  Follow Up Date 07/15/21   - check blood pressure weekly - choose a place to take my blood pressure (home, clinic or office, retail store) - write blood pressure results in a log or diary    Why is this important?   You won't feel high blood pressure, but it can still hurt your blood vessels.  High blood pressure can cause heart or kidney problems. It can also cause a  stroke.  Making lifestyle changes like losing a little weight or eating less salt will help.  Checking your blood pressure at home and at different times of the day can help to control blood pressure.  If the doctor prescribes medicine remember to take it the way the doctor ordered.  Call the office if you cannot afford the medicine or if there are questions about it.     Notes:          Patient verbalizes understanding of instructions provided today and agrees to view in MyChart.   Telephone follow up appointment with care management team member scheduled for: Dudley Major RN, [facility], CDE Care Management Coordinator [facility] [phone], Mobile [phone]

## 2021-06-12 ENCOUNTER — Ambulatory Visit: Payer: Medicare PPO | Admitting: *Deleted

## 2021-06-12 DIAGNOSIS — G319 Degenerative disease of nervous system, unspecified: Secondary | ICD-10-CM

## 2021-06-12 DIAGNOSIS — R413 Other amnesia: Secondary | ICD-10-CM

## 2021-06-12 DIAGNOSIS — F331 Major depressive disorder, recurrent, moderate: Secondary | ICD-10-CM

## 2021-06-12 DIAGNOSIS — I10 Essential (primary) hypertension: Secondary | ICD-10-CM

## 2021-06-12 DIAGNOSIS — F339 Major depressive disorder, recurrent, unspecified: Secondary | ICD-10-CM

## 2021-06-12 DIAGNOSIS — E7800 Pure hypercholesterolemia, unspecified: Secondary | ICD-10-CM

## 2021-06-12 DIAGNOSIS — R45851 Suicidal ideations: Secondary | ICD-10-CM

## 2021-06-12 NOTE — Chronic Care Management (AMB) (Signed)
Chronic Care Management    Clinical Social Work Note  06/12/2021 Name: Madison Glover MRN: [id number] DOB: 1951-01-03  Madison Glover is a 70 y.o. year old female who is a primary care patient of Philip Aspen, Limmie Patricia, MD. The CCM team was consulted to assist the patient with chronic disease management and/or care coordination needs related to: Appointment Scheduling Needs and Mental Health Counseling and Resources.   Engaged with patient's friend's/dual healthcare power's of attorney by telephone for follow-up visit in response to provider referral for social work chronic care management and care coordination services.   Consent to Services:  The patient was given information about Chronic Care Management services, agreed to services, and gave verbal consent prior to initiation of services.  Please see initial visit note for detailed documentation.   Patient agreed to services and consent obtained.   Assessment: Review of patient past medical history, allergies, medications, and health status, including review of relevant consultants reports was performed today as part of a comprehensive evaluation and provision of chronic care management and care coordination services.     SDOH (Social Determinants of Health) assessments and interventions performed:    Advanced Directives Status: Not addressed in this encounter.  CCM Care Plan  Allergies  Allergen Reactions   Bee Venom Swelling    Outpatient Encounter Medications as of 06/12/2021  Medication Sig   alendronate (FOSAMAX) 70 MG tablet TAKE 1 TABLET BY MOUTH EVERY 7 DAYS WITH A FULL GLASS OF WATER ON AN EMPTY STOMACH. (Patient taking differently: Take 70 mg by mouth once a week.)   citalopram (CELEXA) 40 MG tablet Take 1 tablet (40 mg total) by mouth daily.   donepezil (ARICEPT) 10 MG tablet Take 1 tablet (10 mg total) by mouth at bedtime.   hydrochlorothiazide (HYDRODIURIL) 25 MG tablet TAKE 1 TABLET BY MOUTH EVERY  DAY (Patient taking differently: Take 25 mg by mouth daily.)   LOW-DOSE ASPIRIN PO Take 81 mg by mouth daily. Take 1 tablet daily   memantine (NAMENDA) 10 MG tablet Take 1 tablet (10 mg total) by mouth 2 (two) times daily.   metoprolol succinate (TOPROL-XL) 100 MG 24 hr tablet TAKE 1 TABLET BY MOUTH EVERY DAY   mupirocin ointment (BACTROBAN) 2 % Place 1 application into the nose 2 (two) times daily.   simvastatin (ZOCOR) 20 MG tablet TAKE 1 TABLET (20 MG TOTAL) BY MOUTH DAILY AT 6 PM.   vitamin B-12 (CYANOCOBALAMIN) 1000 MCG tablet Take 1,000 mcg by mouth daily.   No facility-administered encounter medications on file as of 06/12/2021.    Patient Active Problem List   Diagnosis Date Noted   MDD (major depressive disorder), recurrent episode, moderate (HCC)    Suicidal ideation    Neurodegenerative cognitive impairment (HCC) 11/06/2020   Positive colorectal cancer screening using Cologuard test    Encounter for screening colonoscopy 10/17/2020   Vitamin D deficiency 07/31/2020   Osteoporosis 05/02/2020   Hypertension    High cholesterol    Depression, recurrent (HCC)    Memory loss 08/02/2019   Vitamin B12 deficiency 08/02/2019    Conditions to be addressed/monitored: Anxiety and Depression.  Mental Health Concerns and Lacks Knowledge of [employer].  Care Plan : LCSW Plan of Care  Updates made by [name], LCSW since 06/12/2021 12:00 AM     Problem: Improve My Quality of Life through Assisted Living Facility Placement. Resolved 06/12/2021  Priority: High     Goal: Improve My Quality of Life through  Assisted Living Facility Placement. Completed 06/12/2021  Start Date: 04/19/2021  Expected End Date: 06/12/2021  This Visit's Progress: On track  Recent Progress: On track  Priority: High  Note:   Current Barriers:  Patient with Dementia, Hypertension, Recurrent Major Depressive Disorder, Moderate, Neurodegenerative Cognitive Impairment, Suicidal Ideation and  Memory Loss needs Support, Education, Referrals, Resources and Assistance with obtaining a higher level of care.   Clinical Goal(s):  Patient will work with LCSW to coordinate care for long-term care assisted living facility placement.   LCSW Interventions:  Collaboration with Primary Care Physician, Dr. [name] regarding development and update of comprehensive plan of care as evidenced by provider attestation and co-signature. Patient Goals/Self-Care Activities: LCSW collaboration with [name], [name] and [name], to obtain update on patient's status, since being placed for long-term care at [facility] at [facility].  LCSW collaboration with Primary Care Physician, Dr. [name] to cancel request for referral to psychiatrist/psychologist, for mandatory psychological evaluation/examination and clearance. LCSW collaboration with Certified Medical Assistant, Sandi Mariscal, with Primary Care Physician, Dr. [name], to cancel request for referral to psychiatrist/psychologist, for mandatory psychological evaluation/examination and clearance. Completion and submission of application for [employer], for you to receive mandatory psychological evaluation/examination in-house (at [facility] at [facility]), for medical clearance, as well as to receive ongoing mental health counseling and supportive services and psychotropic medication management. Contact LCSW directly (# [phone]) if you have questions, need assistance, or if additional social work needs are identified in the near future.   Follow-Up Plan:  No follow-up required, per friends/dual healthcare power's of attorney.     Danford Bad LCSW Licensed Clinical Social Worker [facility] [phone]

## 2021-06-12 NOTE — Patient Instructions (Signed)
Visit Information  Current Barriers:  Patient with Dementia, Hypertension, Recurrent Major Depressive Disorder, Moderate, Neurodegenerative Cognitive Impairment, Suicidal Ideation and Memory Loss needs Support, Education, Referrals, Resources and Assistance with obtaining a higher level of care.   Clinical Goal(s):  Patient will work with LCSW to coordinate care for long-term care assisted living facility placement.   LCSW Interventions:  Collaboration with Primary Care Physician, Dr. [name] regarding development and update of comprehensive plan of care as evidenced by provider attestation and co-signature. Patient Goals/Self-Care Activities: LCSW collaboration with [name], [name] and [name], to obtain update on patient's status, since being placed for long-term care at [facility] at [facility].  LCSW collaboration with Primary Care Physician, Dr. [name] to cancel request for referral to psychiatrist/psychologist, for mandatory psychological evaluation/examination and clearance. LCSW collaboration with Certified Medical Assistant, Sandi Mariscal, with Primary Care Physician, Dr. [name], to cancel request for referral to psychiatrist/psychologist, for mandatory psychological evaluation/examination and clearance. Completion and submission of application for [employer], for you to receive mandatory psychological evaluation/examination in-house (at [facility] at [facility]), for medical clearance, as well as to receive ongoing mental health counseling and supportive services and psychotropic medication management. Contact LCSW directly (# [phone]) if you have questions, need assistance, or if additional social work needs are identified in the near future.   Follow-Up Plan:  No follow-up required, per friends/dual healthcare power's of  attorney.    Patient verbalizes understanding of instructions provided today and agrees to view in MyChart.   Follow-Up Plan:  No follow-up required, per friends/dual healthcare power's of attorney.  Danford Bad LCSW Licensed Clinical Social Worker [facility] [phone]

## 2021-06-26 DIAGNOSIS — Z87891 Personal history of nicotine dependence: Secondary | ICD-10-CM

## 2021-06-26 DIAGNOSIS — I10 Essential (primary) hypertension: Secondary | ICD-10-CM

## 2021-06-26 DIAGNOSIS — F331 Major depressive disorder, recurrent, moderate: Secondary | ICD-10-CM

## 2021-06-26 DIAGNOSIS — M810 Age-related osteoporosis without current pathological fracture: Secondary | ICD-10-CM

## 2021-06-26 DIAGNOSIS — E7800 Pure hypercholesterolemia, unspecified: Secondary | ICD-10-CM

## 2021-06-27 ENCOUNTER — Other Ambulatory Visit: Payer: Medicare PPO

## 2021-07-03 ENCOUNTER — Ambulatory Visit: Payer: Medicare PPO | Admitting: Internal Medicine

## 2021-07-03 ENCOUNTER — Encounter: Payer: Self-pay | Admitting: Internal Medicine

## 2021-07-03 VITALS — BP 110/80 | HR 63 | Temp 98.0°F | Wt 140.1 lb

## 2021-07-03 DIAGNOSIS — R45851 Suicidal ideations: Secondary | ICD-10-CM | POA: Diagnosis not present

## 2021-07-03 DIAGNOSIS — G319 Degenerative disease of nervous system, unspecified: Secondary | ICD-10-CM

## 2021-07-03 DIAGNOSIS — F331 Major depressive disorder, recurrent, moderate: Secondary | ICD-10-CM

## 2021-07-03 MED ORDER — SERTRALINE HCL 25 MG PO TABS: 25.0000 mg | ORAL_TABLET | Freq: Every day | ORAL | 1 refills | Status: AC

## 2021-07-03 NOTE — Progress Notes (Signed)
Established Patient Office Visit     This visit occurred during the SARS-CoV-2 public health emergency.  Safety protocols were in place, including screening questions prior to the visit, additional usage of staff PPE, and extensive cleaning of exam room while observing appropriate contact time as indicated for disinfecting solutions.    CC/Reason for Visit: Discuss medications  HPI: Madison Glover is a 70 y.o. female who is coming in today for the above mentioned reasons.  She is now living at [facility] and is doing well there.  As usual her friend [name] is with her today.  Boneta Lucks tells me how happy she is at this new place.  She was hospitalized at [facility] from the end of October till around the first week of November per her report.  Apparently she had been hiding in her garage and nobody could find her for a day, it would appear that a missing persons report was filed and canceled after they found her.  Then when they went to tour the assisted living facility she jumped out of a moving vehicle.  All of this prompted her hospitalization.  They would like me to confirm that the medications that she was discharged on are correct.  The only changes I can see is that her simvastatin was transitioned over to atorvastatin, she was taken off citalopram and placed on sertraline.  Past Medical/Surgical History: Past Medical History:  Diagnosis Date   Allergy    Anxiety    High cholesterol    Hypertension    Memory loss    Osteoporosis    Vitamin D deficiency     Past Surgical History:  Procedure Laterality Date   COLONOSCOPY WITH PROPOFOL N/A 10/19/2020   Procedure: COLONOSCOPY WITH PROPOFOL;  Surgeon: Rachael Fee, MD;  Location: [facility];  Service: Endoscopy;  Laterality: N/A;   LAPAROSCOPY     POLYPECTOMY  10/19/2020   Procedure: POLYPECTOMY;  Surgeon: Rachael Fee, MD;  Location: [facility];  Service: Endoscopy;;   TONSILLECTOMY       Social History:  reports that she has quit smoking. Her smoking use included cigarettes. She has been exposed to tobacco smoke. She has never used smokeless tobacco. She reports that she does not currently use alcohol. She reports that she does not use drugs.  Allergies: Allergies  Allergen Reactions   Bee Venom Swelling    Family History:  Family History  Problem Relation Age of Onset   Breast cancer Mother    COPD Father    Heart disease Father    Hypertension Father    Cancer Brother        type unknown   Colon cancer Neg Hx    Esophageal cancer Neg Hx    Stomach cancer Neg Hx    Rectal cancer Neg Hx      Current Outpatient Medications:    atorvastatin (LIPITOR) 20 MG tablet, Take 20 mg by mouth daily., Disp: , Rfl:    donepezil (ARICEPT) 10 MG tablet, Take 1 tablet (10 mg total) by mouth at bedtime., Disp: 90 tablet, Rfl: 3   hydrochlorothiazide (HYDRODIURIL) 25 MG tablet, TAKE 1 TABLET BY MOUTH EVERY DAY (Patient taking differently: Take 25 mg by mouth daily.), Disp: 90 tablet, Rfl: 1   memantine (NAMENDA) 10 MG tablet, Take 1 tablet (10 mg total) by mouth 2 (two) times daily., Disp: 180 tablet, Rfl: 3   metoprolol succinate (TOPROL-XL) 100 MG 24 hr tablet,  TAKE 1 TABLET BY MOUTH EVERY DAY, Disp: 90 tablet, Rfl: 1   sertraline (ZOLOFT) 25 MG tablet, Take 1 tablet (25 mg total) by mouth daily., Disp: 90 tablet, Rfl: 1  Review of Systems:  Constitutional: Denies fever, chills, diaphoresis, appetite change and fatigue.  HEENT: Denies photophobia, eye pain, redness, hearing loss, ear pain, congestion, sore throat, rhinorrhea, sneezing, mouth sores, trouble swallowing, neck pain, neck stiffness and tinnitus.   Respiratory: Denies SOB, DOE, cough, chest tightness,  and wheezing.   Cardiovascular: Denies chest pain, palpitations and leg swelling.  Gastrointestinal: Denies nausea, vomiting, abdominal pain, diarrhea, constipation, blood in stool and abdominal distention.   Genitourinary: Denies dysuria, urgency, frequency, hematuria, flank pain and difficulty urinating.  Endocrine: Denies: hot or cold intolerance, sweats, changes in hair or nails, polyuria, polydipsia. Musculoskeletal: Denies myalgias, back pain, joint swelling, arthralgias and gait problem.  Skin: Denies pallor, rash and wound.  Neurological: Denies dizziness, seizures, syncope, weakness, light-headedness, numbness and headaches.  Hematological: Denies adenopathy. Easy bruising, personal or family bleeding history  Psychiatric/Behavioral: Denies suicidal ideation, mood changes, confusion, nervousness, sleep disturbance and agitation    Physical Exam: Vitals:   07/03/21 1129  BP: 110/80  Pulse: 63  Temp: 98 F (36.7 C)  TempSrc: Oral  SpO2: 95%  Weight: 140 lb 1.6 oz (63.5 kg)    Body mass index is 30.32 kg/m.   Constitutional: NAD, calm, comfortable Eyes: PERRL, lids and conjunctivae normal, wears corrective lenses ENMT: Mucous membranes are moist.  Respiratory: clear to auscultation bilaterally, no wheezing, no crackles. Normal respiratory effort. No accessory muscle use.  Cardiovascular: Regular rate and rhythm, no murmurs / rubs / gallops. No extremity edema. Neurologic: Grossly intact and nonfocal   Impression and Plan:  MDD (major depressive disorder), recurrent episode, moderate (HCC)  Suicidal ideation  Neurodegenerative cognitive impairment ([place])  -I agree with medication changes as made. -Medication list has been updated.  Time spent: 23 minutes reviewing chart, performing medication reconciliation, examining patient.     Lelon Frohlich, MD  Primary Care at [facility]

## 2021-07-10 DIAGNOSIS — I10 Essential (primary) hypertension: Secondary | ICD-10-CM | POA: Diagnosis not present

## 2021-07-10 DIAGNOSIS — Z79899 Other long term (current) drug therapy: Secondary | ICD-10-CM | POA: Diagnosis not present

## 2021-07-10 DIAGNOSIS — F332 Major depressive disorder, recurrent severe without psychotic features: Secondary | ICD-10-CM | POA: Diagnosis not present

## 2021-07-10 DIAGNOSIS — G301 Alzheimer's disease with late onset: Secondary | ICD-10-CM | POA: Diagnosis not present

## 2021-07-10 DIAGNOSIS — F02B4 Dementia in other diseases classified elsewhere, moderate, with anxiety: Secondary | ICD-10-CM | POA: Diagnosis not present

## 2021-07-10 DIAGNOSIS — M810 Age-related osteoporosis without current pathological fracture: Secondary | ICD-10-CM | POA: Diagnosis not present

## 2021-07-10 DIAGNOSIS — E782 Mixed hyperlipidemia: Secondary | ICD-10-CM | POA: Diagnosis not present

## 2021-07-14 ENCOUNTER — Other Ambulatory Visit: Payer: Self-pay | Admitting: Internal Medicine

## 2021-07-15 ENCOUNTER — Ambulatory Visit (INDEPENDENT_AMBULATORY_CARE_PROVIDER_SITE_OTHER): Payer: Medicare PPO

## 2021-07-15 DIAGNOSIS — I10 Essential (primary) hypertension: Secondary | ICD-10-CM

## 2021-07-15 DIAGNOSIS — F331 Major depressive disorder, recurrent, moderate: Secondary | ICD-10-CM

## 2021-07-15 DIAGNOSIS — R413 Other amnesia: Secondary | ICD-10-CM

## 2021-07-15 NOTE — Patient Instructions (Signed)
Visit Information  Thank you for allowing me to share the care management and care coordination services that are available to you as part of your health plan and services through your primary care provider and medical home. Please reach out to me at [phone] if the care management/care coordination team may be of assistance to you in the future.  Myeisha Kruser RN, BSN,CCM, CDE Care Management Coordinator [facility] [phone], Mobile [phone]   

## 2021-07-15 NOTE — Chronic Care Management (AMB) (Signed)
Chronic Care Management   CCM RN Visit Note  07/15/2021 Name: Madison Glover MRN: [id number] DOB: January 14, 1951  Subjective: Madison Glover is a 70 y.o. year old female who is a primary care patient of Madison Glover, Madison Patricia, MD. The care management team was consulted for assistance with disease management and care coordination needs.    Engaged with patient by telephone for follow up visit in response to provider referral for case management and/or care coordination services.   Consent to Services:  The patient was given information about Chronic Care Management services, agreed to services, and gave verbal consent prior to initiation of services.  Please see initial visit note for detailed documentation.   Patient agreed to services and verbal consent obtained.   Assessment: Review of patient past medical history, allergies, medications, health status, including review of consultants reports, laboratory and other test data, was performed as part of comprehensive evaluation and provision of chronic care management services.   SDOH (Social Determinants of Health) assessments and interventions performed:    CCM Care Plan  Allergies  Allergen Reactions   Bee Venom Swelling    Outpatient Encounter Medications as of 07/15/2021  Medication Sig   atorvastatin (LIPITOR) 20 MG tablet Take 20 mg by mouth daily.   donepezil (ARICEPT) 10 MG tablet Take 1 tablet (10 mg total) by mouth at bedtime.   hydrochlorothiazide (HYDRODIURIL) 25 MG tablet TAKE 1 TABLET BY MOUTH EVERY DAY (Patient taking differently: Take 25 mg by mouth daily.)   memantine (NAMENDA) 10 MG tablet Take 1 tablet (10 mg total) by mouth 2 (two) times daily.   metoprolol succinate (TOPROL-XL) 100 MG 24 hr tablet TAKE 1 TABLET BY MOUTH EVERY DAY   sertraline (ZOLOFT) 25 MG tablet Take 1 tablet (25 mg total) by mouth daily.   No facility-administered encounter medications on file as of 07/15/2021.    Patient  Active Problem List   Diagnosis Date Noted   MDD (major depressive disorder), recurrent episode, moderate (HCC)    Suicidal ideation    Neurodegenerative cognitive impairment (HCC) 11/06/2020   Positive colorectal cancer screening using Cologuard test    Encounter for screening colonoscopy 10/17/2020   Vitamin D deficiency 07/31/2020   Osteoporosis 05/02/2020   Hypertension    High cholesterol    Depression, recurrent (HCC)    Memory loss 08/02/2019   Vitamin B12 deficiency 08/02/2019    Conditions to be addressed/monitored:HTN, Depression, and Dementia  Care Plan : RNCM:Dementia (Adult)  Updates made by Madison Glassman, RN since 07/15/2021 12:00 AM  Completed 07/15/2021   Problem: Potential of Harm or Injury related to dementia Resolved 07/15/2021  Priority: High     Long-Range Goal: Harm or Injury Prevented related to dementia Completed 07/15/2021  Start Date: 02/18/2021  Expected End Date: 07/28/2021  This Visit's Progress: On track  Recent Progress: On track  Priority: High  Note:   Resolved case closed no further needs and will be transitioning to Doctors making house calls Current Barriers:  Ineffective Self Health Maintenance in a patient with Dementia Unable to independently self manage dementia Unable to self administer medications as prescribed Unable to perform IADLs independently Spoke with HCPOA Madison Glover  and Madison Glover friends.  States pt has been assisted living is settling in well.  States she is involved with activities at the facility. States that pt will be followed by primary care at the facility and she will also be getting counseling there.Pt is waiting to get psychiatric  evaluation that provider ordered.   Clinical Goal(s):  Collaboration with Madison Glover, Madison Patricia, MD regarding development and update of comprehensive plan of care as evidenced by provider attestation and co-signature Inter-disciplinary care team collaboration (see  longitudinal plan of care) patient will work with care management team to address care coordination and chronic disease management needs related to Disease Management Educational Needs Care Coordination Medication Management and Education Psychosocial Support Caregiver Stress support Dementia and Caregiver Support Level of Care Concerns   Interventions:  Evaluation of current treatment plan related to HTN, HLD, and Dementia, Level of care concerns, ADL IADL limitations, Family and relationship dysfunction, Cognitive Deficits, Memory Deficits, Inability to perform IADL's independently, and Lacks knowledge of community resource: dementia  self-management and patient's adherence to plan as established by provider. Collaboration with Madison Glover, Madison Patricia, MD regarding development and update of comprehensive plan of care as evidenced by provider attestation       and co-signature Inter-disciplinary care team collaboration (see longitudinal plan of care) Discussed plans with patient for ongoing care management follow up and provided patient with direct contact information for care management team Referred to CCM social works for level of care issues, caregiver stress and resources-LCSW working with pt/caregivers now in assisted living facility in [place] now- LCSW case closes  Self Care Activities:  Self administers medications as prescribed Attends all scheduled provider appointments Calls pharmacy for medication refills Attends church or other social activities Calls provider office for new concerns or questions Patient Goals: - attend dementia support group - check out dementia website - check out other places when staying at home is no longer possible (assisted living center, nursing home) - check out services like in-home help or adult day care - check with a financial planner - connect with the local or national dementia organization - hold a family meeting with family members and loved  ones - list the symptoms that would make staying at home too hard - make a list of future care and financial needs - make a list of people who can help and what they can do Follow Up Plan: No further follow up case closed no further needs and will be transitioning to Doctors making house calls    Care Plan : RNCM:Cardiovascular disease (HTN and HLD)  Updates made by Madison Glassman, RN since 07/15/2021 12:00 AM  Completed 07/15/2021   Problem: Lack of long term Cardiovascular disease Management (HTN and HLD) Resolved 07/15/2021  Priority: Medium     Long-Range Goal: Effective Cardiovascular disease Self Management (HTN and HLD) Completed 07/15/2021  Start Date: 02/18/2021  Expected End Date: 07/28/2021  This Visit's Progress: On track  Recent Progress: On track  Priority: Medium  Note:   Resolved case closed no further needs and will be transitioning to Doctors making house calls Current Barriers:  Knowledge Deficits related to basic understanding of Cardiovascular disease (HTN and HLD) pathophysiology and self care management Unable to independently Self manage Cardiovascular disease (HTN and HLD) Unable to self administer medications as prescribed Unable to perform IADLs independently Spoke with HCPOA Madison Glover friends.Caregiver states that assisted living facility is making sure pt is taking her medications,checking her B/P and making sure she eats healthy Nurse Case Manager Clinical Goal(s):  patient will verbalize understanding of plan for hypertension management patient will demonstrate improved adherence to prescribed treatment plan for hypertension as evidenced by taking all medications as prescribed, monitoring and recording blood pressure as directed, adhering to low sodium/DASH diet patient  will demonstrate improved health management independence as evidenced by checking blood pressure as directed and notifying PCP if SBP>160 or DBP > 90, taking all medications as  prescribe, and adhering to a low sodium diet as discussed. patient will verbalize basic understanding of hypertension disease process and self health management plan as evidenced by readings within limits and adherence to medications  Interventions:  Collaboration with Madison Glover, Madison Patricia, MD regarding development and update of comprehensive plan of care as evidenced by provider attestation and co-signature Inter-disciplinary care team collaboration (see longitudinal plan of care) Evaluation of current treatment plan related to hypertension self management and patient's adherence to plan as established by provider. REinforced education to patient re: stroke prevention, s/s of heart attack and stroke, DASH diet, complications of uncontrolled blood pressure Reviewed medications with patient and discussed importance of compliance Discussed plans with patient for ongoing care management follow up and provided patient with direct contact information for care management team Self-Care Activities:  Self administers medications as prescribed Attends all scheduled provider appointments Calls pharmacy for medication refills Attends church or other social activities Calls provider office for new concerns or questions Patient Goals  - Self administer medications as prescribed  - Attend all scheduled provider appointments  - Call provider office for new concerns, questions, or BP outside discussed parameters  - Check BP and record as discussed  - Follows a low sodium diet/DASH diet - check blood pressure weekly - choose a place to take my blood pressure (home, clinic or office, retail store) - write blood pressure results in a log or diary - ask questions to understand Follow Up Plan: No further follow up case closed no further needs and will be transitioning to Doctors making house calls     Plan:No further follow up required: case closed no further needs and will be transitioning to Doctors  making house calls Dudley Major RN, [facility], CDE Care Management Coordinator [facility] [phone], Mobile [phone]

## 2021-07-27 DIAGNOSIS — I10 Essential (primary) hypertension: Secondary | ICD-10-CM | POA: Diagnosis not present

## 2021-07-27 DIAGNOSIS — F331 Major depressive disorder, recurrent, moderate: Secondary | ICD-10-CM

## 2021-07-31 ENCOUNTER — Other Ambulatory Visit: Payer: Self-pay | Admitting: Internal Medicine

## 2021-07-31 ENCOUNTER — Other Ambulatory Visit: Payer: Self-pay

## 2021-07-31 ENCOUNTER — Ambulatory Visit
Admission: RE | Admit: 2021-07-31 | Discharge: 2021-07-31 | Disposition: A | Discharge status: Home / Self Care | Payer: Medicare PPO | Source: Ambulatory Visit | Attending: Internal Medicine | Admitting: Internal Medicine

## 2021-07-31 DIAGNOSIS — N6320 Unspecified lump in the left breast, unspecified quadrant: Secondary | ICD-10-CM

## 2021-07-31 DIAGNOSIS — R928 Other abnormal and inconclusive findings on diagnostic imaging of breast: Secondary | ICD-10-CM

## 2021-07-31 DIAGNOSIS — R922 Inconclusive mammogram: Secondary | ICD-10-CM | POA: Diagnosis not present

## 2021-08-05 ENCOUNTER — Telehealth: Payer: Self-pay | Admitting: Internal Medicine

## 2021-08-05 NOTE — Telephone Encounter (Signed)
Milus Height POA is calling and would like to confirm it patient suppose to taking citalopram and generic zoloft

## 2021-08-06 ENCOUNTER — Telehealth: Payer: Self-pay | Admitting: Internal Medicine

## 2021-08-06 NOTE — Telephone Encounter (Signed)
Upon chart review of OV on 07/03/21 to discuss medications: The only changes I can see is that her simvastatin was transitioned over to atorvastatin, she was taken off citalopram and placed on sertraline. -I agree with medication changes as made. -Medication list has been updated.  Pt should be on Zoloft only. LVM to discuss with [name], can return phone call.

## 2021-08-06 NOTE — Telephone Encounter (Signed)
Instructed to Madison Glover that pt is only supposed to be taking Sertraline; was taken off citalopram. Madison Glover verb understanding & denies further ques/concerns at this time.

## 2021-08-06 NOTE — Telephone Encounter (Signed)
Returning phone call regarding patients medications. Madison Glover stated her phone was in her purse and did not hear it... She is awating a return call [phone].

## 2021-08-06 NOTE — Telephone Encounter (Signed)
LVM instructions for Madison Glover to call at her earliest convenience.

## 2021-09-18 DIAGNOSIS — F331 Major depressive disorder, recurrent, moderate: Secondary | ICD-10-CM | POA: Diagnosis not present

## 2021-09-18 DIAGNOSIS — F411 Generalized anxiety disorder: Secondary | ICD-10-CM | POA: Diagnosis not present

## 2021-10-03 DIAGNOSIS — F411 Generalized anxiety disorder: Secondary | ICD-10-CM | POA: Diagnosis not present

## 2021-10-03 DIAGNOSIS — F331 Major depressive disorder, recurrent, moderate: Secondary | ICD-10-CM | POA: Diagnosis not present

## 2021-10-10 DIAGNOSIS — F331 Major depressive disorder, recurrent, moderate: Secondary | ICD-10-CM | POA: Diagnosis not present

## 2021-10-10 DIAGNOSIS — F411 Generalized anxiety disorder: Secondary | ICD-10-CM | POA: Diagnosis not present

## 2021-10-18 ENCOUNTER — Other Ambulatory Visit: Payer: Self-pay | Admitting: Internal Medicine

## 2021-10-19 DIAGNOSIS — F411 Generalized anxiety disorder: Secondary | ICD-10-CM | POA: Diagnosis not present

## 2021-10-19 DIAGNOSIS — F331 Major depressive disorder, recurrent, moderate: Secondary | ICD-10-CM | POA: Diagnosis not present

## 2021-10-24 DIAGNOSIS — F411 Generalized anxiety disorder: Secondary | ICD-10-CM | POA: Diagnosis not present

## 2021-10-24 DIAGNOSIS — F331 Major depressive disorder, recurrent, moderate: Secondary | ICD-10-CM | POA: Diagnosis not present

## 2021-10-30 DIAGNOSIS — F332 Major depressive disorder, recurrent severe without psychotic features: Secondary | ICD-10-CM | POA: Diagnosis not present

## 2021-10-30 DIAGNOSIS — F411 Generalized anxiety disorder: Secondary | ICD-10-CM | POA: Diagnosis not present

## 2021-11-01 DIAGNOSIS — F0390 Unspecified dementia without behavioral disturbance: Secondary | ICD-10-CM | POA: Diagnosis not present

## 2021-11-01 DIAGNOSIS — I10 Essential (primary) hypertension: Secondary | ICD-10-CM | POA: Diagnosis not present

## 2021-11-07 DIAGNOSIS — F411 Generalized anxiety disorder: Secondary | ICD-10-CM | POA: Diagnosis not present

## 2021-11-07 DIAGNOSIS — F332 Major depressive disorder, recurrent severe without psychotic features: Secondary | ICD-10-CM | POA: Diagnosis not present

## 2021-11-16 DIAGNOSIS — F332 Major depressive disorder, recurrent severe without psychotic features: Secondary | ICD-10-CM | POA: Diagnosis not present

## 2021-11-16 DIAGNOSIS — F411 Generalized anxiety disorder: Secondary | ICD-10-CM | POA: Diagnosis not present

## 2021-11-21 DIAGNOSIS — F411 Generalized anxiety disorder: Secondary | ICD-10-CM | POA: Diagnosis not present

## 2021-11-21 DIAGNOSIS — F332 Major depressive disorder, recurrent severe without psychotic features: Secondary | ICD-10-CM | POA: Diagnosis not present

## 2021-11-28 DIAGNOSIS — F411 Generalized anxiety disorder: Secondary | ICD-10-CM | POA: Diagnosis not present

## 2021-11-28 DIAGNOSIS — F332 Major depressive disorder, recurrent severe without psychotic features: Secondary | ICD-10-CM | POA: Diagnosis not present

## 2021-12-05 DIAGNOSIS — F411 Generalized anxiety disorder: Secondary | ICD-10-CM | POA: Diagnosis not present

## 2021-12-05 DIAGNOSIS — F332 Major depressive disorder, recurrent severe without psychotic features: Secondary | ICD-10-CM | POA: Diagnosis not present

## 2021-12-12 DIAGNOSIS — F411 Generalized anxiety disorder: Secondary | ICD-10-CM | POA: Diagnosis not present

## 2021-12-12 DIAGNOSIS — F332 Major depressive disorder, recurrent severe without psychotic features: Secondary | ICD-10-CM | POA: Diagnosis not present

## 2021-12-24 DIAGNOSIS — F339 Major depressive disorder, recurrent, unspecified: Secondary | ICD-10-CM | POA: Diagnosis not present

## 2021-12-24 DIAGNOSIS — F419 Anxiety disorder, unspecified: Secondary | ICD-10-CM | POA: Diagnosis not present

## 2022-01-01 ENCOUNTER — Other Ambulatory Visit: Payer: Medicare PPO

## 2022-01-06 ENCOUNTER — Ambulatory Visit
Admission: RE | Admit: 2022-01-06 | Discharge: 2022-01-06 | Disposition: A | Discharge status: Home / Self Care | Payer: Medicare PPO | Source: Ambulatory Visit | Attending: Internal Medicine | Admitting: Internal Medicine

## 2022-01-06 DIAGNOSIS — N6320 Unspecified lump in the left breast, unspecified quadrant: Secondary | ICD-10-CM

## 2022-09-17 IMAGING — MG DIGITAL DIAGNOSTIC BILAT W/ TOMO W/ CAD
8 series · 8 of 24 positions shown · non-contrast
Comparison: Previous exam(s).

CLINICAL DATA: Short-term follow-up for left breast asymmetries and
probably benign masses, initially assessed on 12/25/2020.

EXAM:
DIGITAL DIAGNOSTIC BILATERAL MAMMOGRAM WITH TOMOSYNTHESIS AND CAD;
ULTRASOUND LEFT BREAST LIMITED
TECHNIQUE: Bilateral digital diagnostic mammography and breast tomosynthesis
was performed. The images were evaluated with computer-aided
detection.; Targeted ultrasound examination of the left breast was
performed.

[L CC synth-2D]
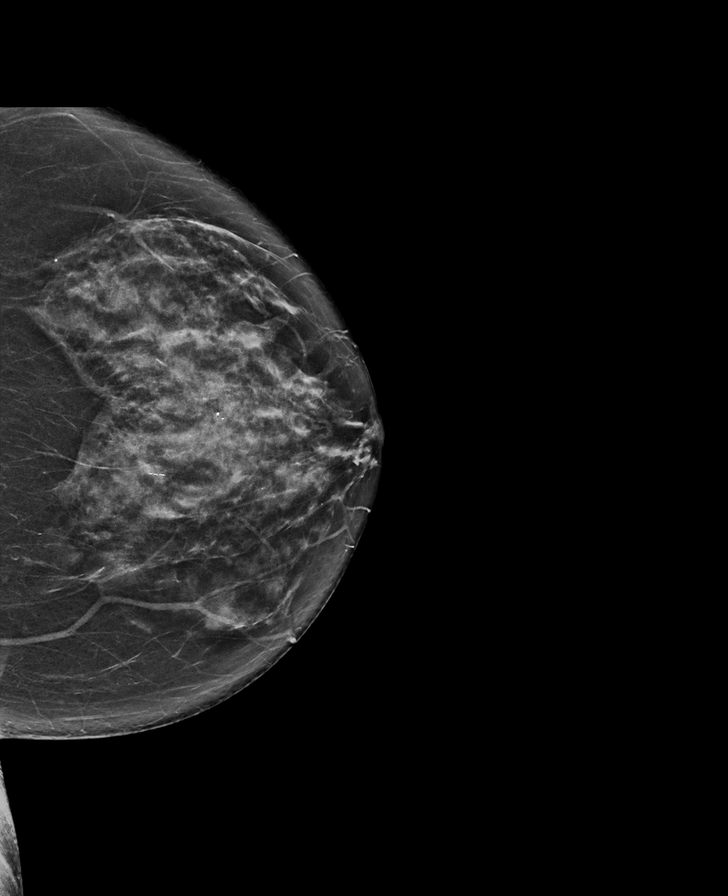

[L MLO synth-2D]
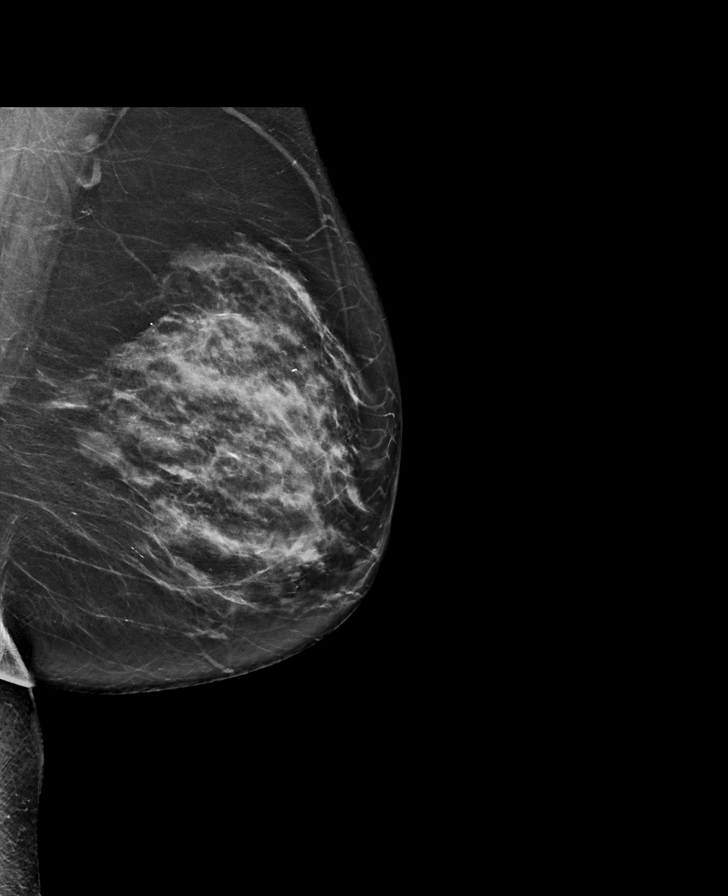

[R MLO synth-2D]
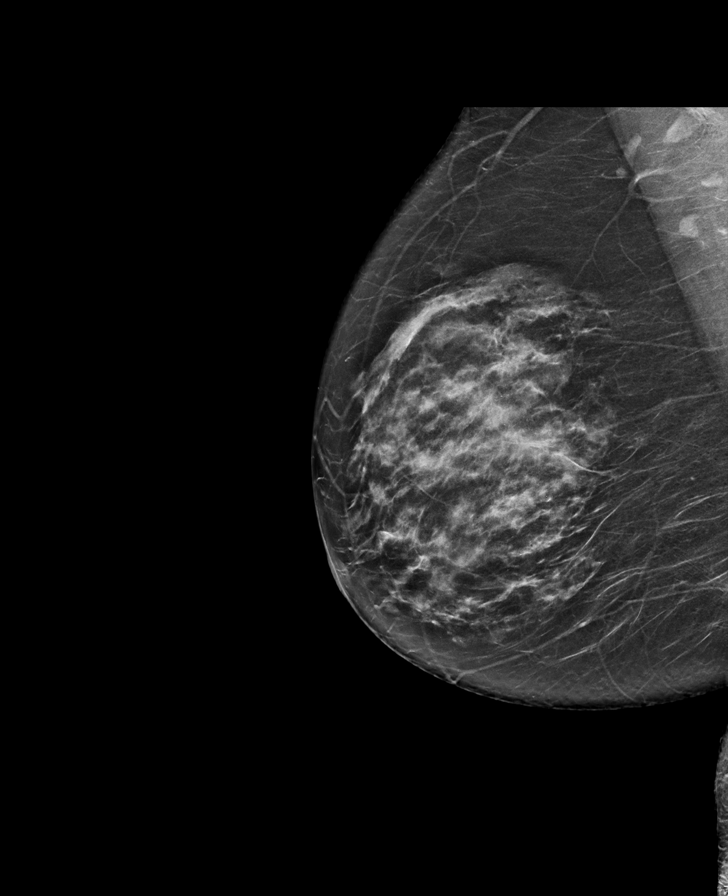

[R CC synth-2D]
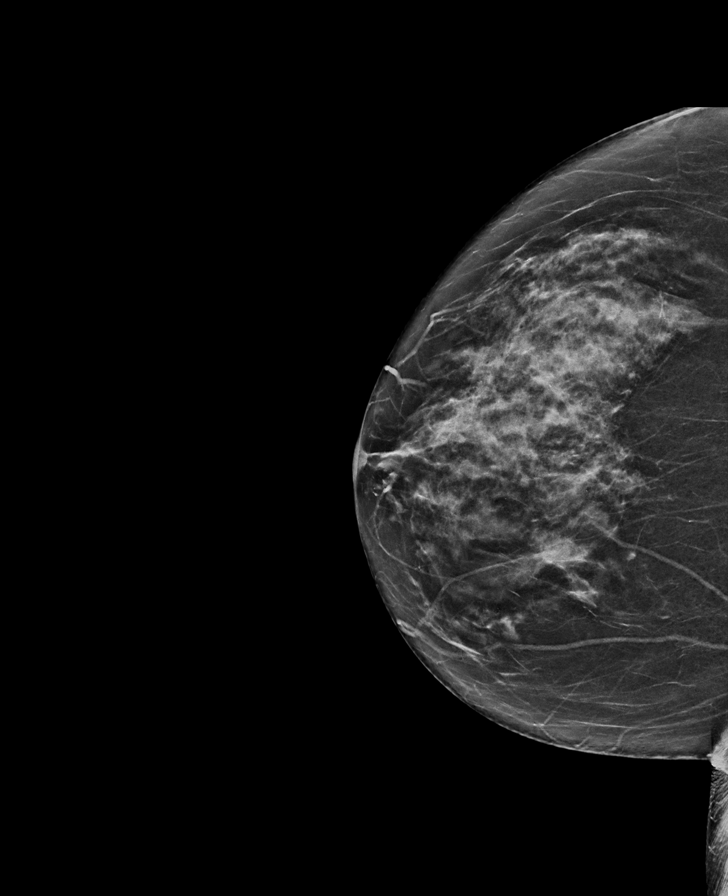

[L MLO tomo · tomo slice 37/74.0]
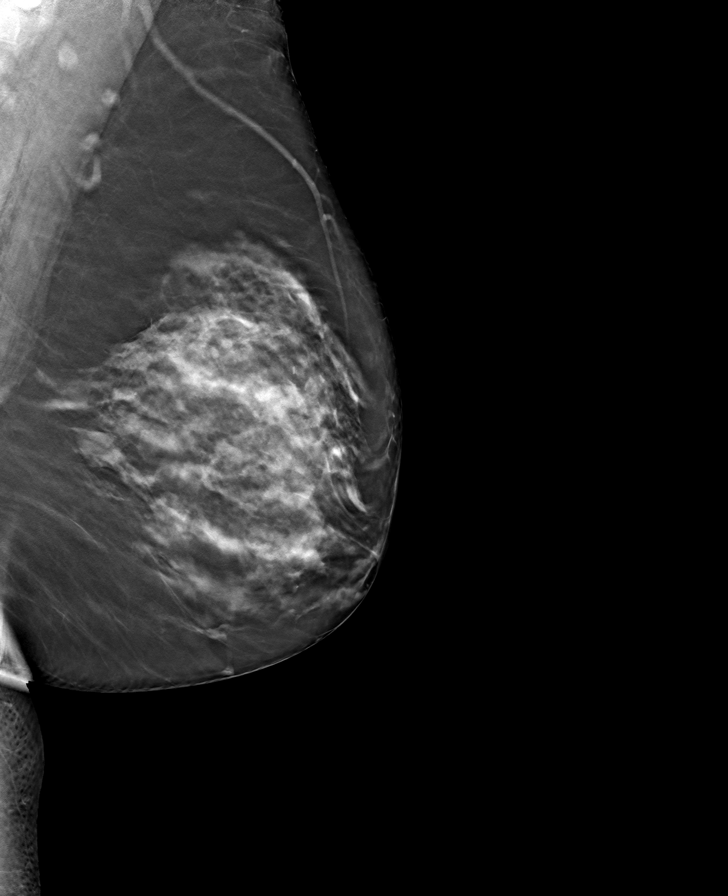

[L CC tomo · tomo slice 33/66.0]
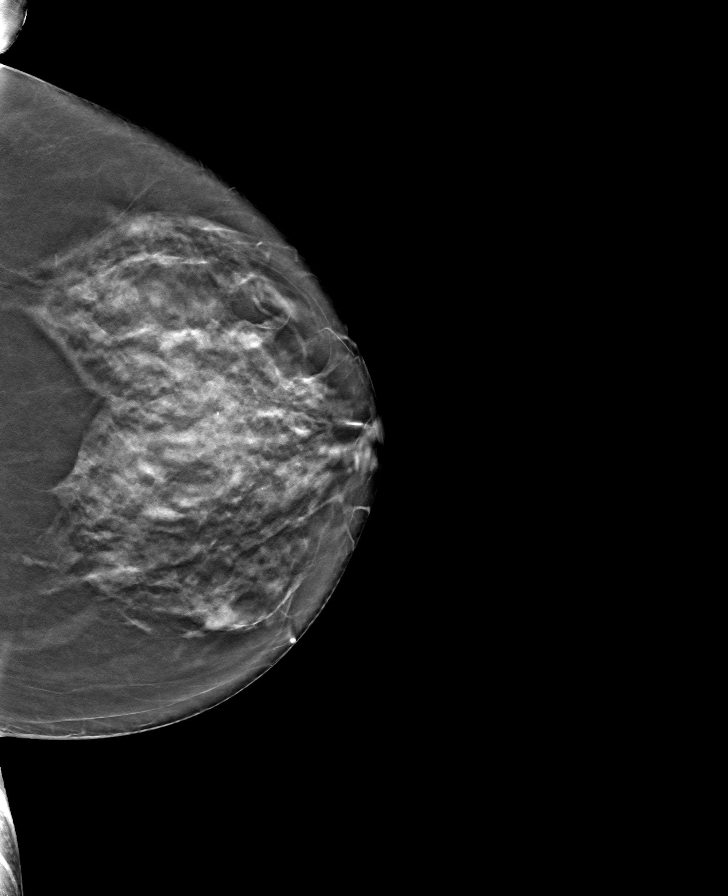

[R CC tomo · tomo slice 33/64.0]
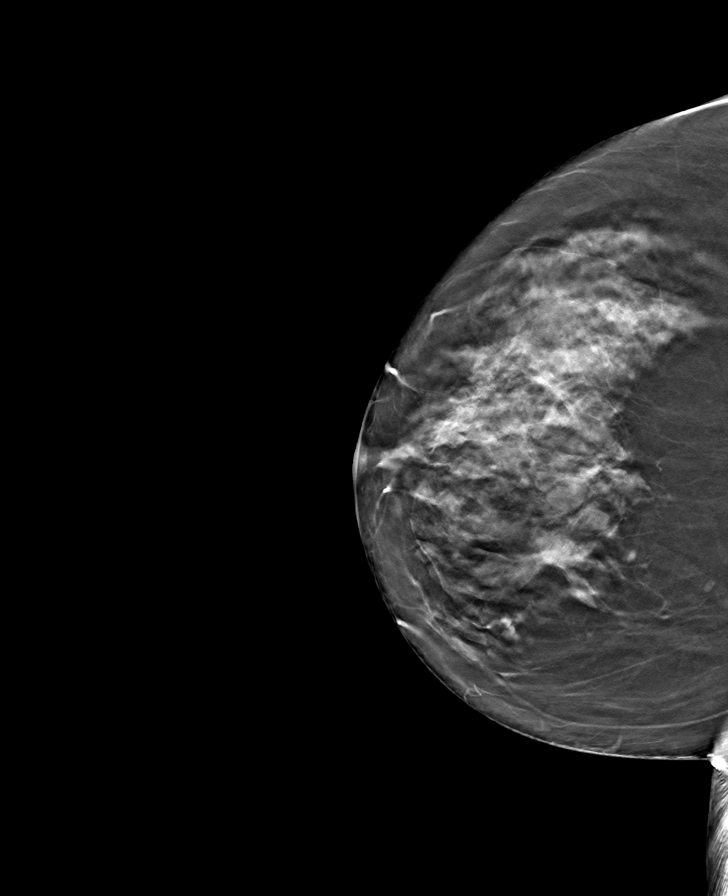

[R MLO tomo · tomo slice 37/72.0]
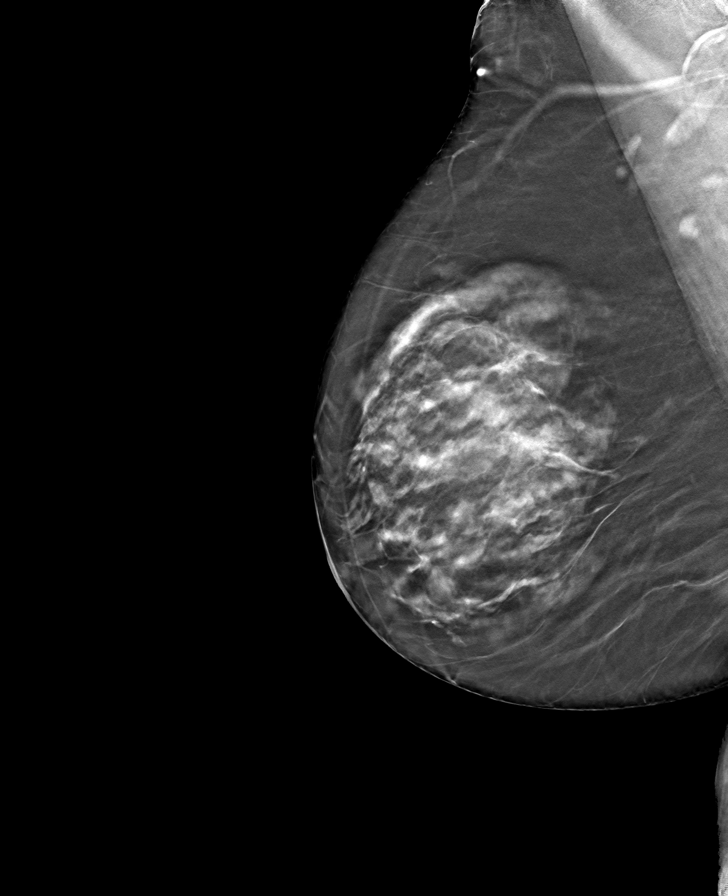

[8 of 24 positions shown; findings below may reference images not displayed]

ACR Breast Density Category c: The breast tissue is heterogeneously
dense, which may obscure small masses.
FINDINGS: The asymmetry in the medial aspect of the left breast is less
apparent than on the 12/25/2020 exam. It is consistent with normal
fibroglandular tissue.

There are no breast masses, areas of architectural distortion or new
areas of asymmetry. There are no suspicious calcifications.

Targeted left breast ultrasound is performed, showing a tiny oval
cyst at 3 o'clock, 7 cm the nipple, measuring 3 x 2 x 3 mm,
unchanged. The hypoechoic lesion noted at 2 o'clock, 6 cm the
nipple, is not currently visualized. There are no suspicious masses.
IMPRESSION: 1. No evidence of breast malignancy.
2. Asymmetry previously noted in the medial left breast is
consistent with normal fibroglandular tissue, less apparent on the
current exam.
3. Resolved lesion previously seen at 2 o'clock, 6 cm the nipple on
sonography.
4. Stable 3 mm left breast cyst at 3 o'clock, 7 cm the nipple.

RECOMMENDATION:
Screening mammogram in one year.(Code:TA-Z-NBW)

I have discussed the findings and recommendations with the patient.
If applicable, a reminder letter will be sent to the patient
regarding the next appointment.

BI-RADS CATEGORY  2: Benign.

## 2023-01-06 ENCOUNTER — Other Ambulatory Visit: Payer: Self-pay | Admitting: Student

## 2023-01-06 DIAGNOSIS — Z1231 Encounter for screening mammogram for malignant neoplasm of breast: Secondary | ICD-10-CM

## 2023-01-21 ENCOUNTER — Ambulatory Visit
Admission: RE | Admit: 2023-01-21 | Discharge: 2023-01-21 | Disposition: A | Discharge status: Home / Self Care | Payer: Medicare PPO | Source: Ambulatory Visit | Attending: Student | Admitting: Student

## 2023-01-21 DIAGNOSIS — Z1231 Encounter for screening mammogram for malignant neoplasm of breast: Secondary | ICD-10-CM

## 2024-01-21 ENCOUNTER — Encounter: Payer: Self-pay | Admitting: Geriatric Medicine

## 2024-02-01 ENCOUNTER — Other Ambulatory Visit: Payer: Self-pay | Admitting: Internal Medicine

## 2024-02-01 DIAGNOSIS — Z1231 Encounter for screening mammogram for malignant neoplasm of breast: Secondary | ICD-10-CM

## 2024-02-26 ENCOUNTER — Ambulatory Visit
Admission: RE | Admit: 2024-02-26 | Discharge: 2024-02-26 | Disposition: A | Discharge status: Home / Self Care | Source: Ambulatory Visit | Attending: Internal Medicine | Admitting: Internal Medicine

## 2024-02-26 DIAGNOSIS — Z1231 Encounter for screening mammogram for malignant neoplasm of breast: Secondary | ICD-10-CM

## 2024-06-15 ENCOUNTER — Other Ambulatory Visit: Payer: Self-pay

## 2024-06-15 ENCOUNTER — Emergency Department (HOSPITAL_BASED_OUTPATIENT_CLINIC_OR_DEPARTMENT_OTHER)

## 2024-06-15 ENCOUNTER — Encounter (HOSPITAL_BASED_OUTPATIENT_CLINIC_OR_DEPARTMENT_OTHER): Payer: Self-pay | Admitting: Emergency Medicine

## 2024-06-15 ENCOUNTER — Emergency Department (HOSPITAL_BASED_OUTPATIENT_CLINIC_OR_DEPARTMENT_OTHER)
Admission: EM | Admit: 2024-06-15 | Discharge: 2024-06-15 | Disposition: A | Discharge status: Home / Self Care | Attending: Emergency Medicine | Admitting: Emergency Medicine

## 2024-06-15 DIAGNOSIS — Z043 Encounter for examination and observation following other accident: Secondary | ICD-10-CM | POA: Insufficient documentation

## 2024-06-15 DIAGNOSIS — W01198A Fall on same level from slipping, tripping and stumbling with subsequent striking against other object, initial encounter: Secondary | ICD-10-CM | POA: Insufficient documentation

## 2024-06-15 DIAGNOSIS — I6782 Cerebral ischemia: Secondary | ICD-10-CM | POA: Insufficient documentation

## 2024-06-15 DIAGNOSIS — W19XXXA Unspecified fall, initial encounter: Secondary | ICD-10-CM

## 2024-06-15 DIAGNOSIS — Z79899 Other long term (current) drug therapy: Secondary | ICD-10-CM | POA: Insufficient documentation

## 2024-06-15 DIAGNOSIS — I10 Essential (primary) hypertension: Secondary | ICD-10-CM | POA: Insufficient documentation

## 2024-06-15 NOTE — ED Triage Notes (Signed)
 Pt via  ems from [facility].  Clemens getting out of the shower, slipped on water. No head injury or LOC; no thinners. Pt c/o back pain at the time of the fall, but denies pain at this time. EMS reports cbg 122 and VS WNL for patient.   PT a&o 4; Pt has some short term memory issues; facility is contacting family members.

## 2024-06-15 NOTE — ED Notes (Signed)
 Successfully contacted Madison Glover who will be coming to pick up pt. Reports she is going to contact the facility and will be on her way to pick pt up. Pt notified

## 2024-06-15 NOTE — Discharge Instructions (Signed)
Recheck with your primary care provider.  Return to the emergency room for worsening or concerning symptoms. 

## 2024-06-15 NOTE — ED Notes (Signed)
 Facility faculty Alverna here to pick up pt and take her back to [facility]. Pt d/c instructions, medications, and follow-up care reviewed with pt and facility faculty member. Pt and faculty member Alverna verbalized understanding and had no further questions at time of d/c. Pt CA&Ox4, ambulatory, and in NAD at time of d/c

## 2024-06-15 NOTE — ED Notes (Signed)
 Attempted to contact [facility], sent to voicemail on the nurses line, voicemail left for the nurse regarding patient's discharge and return to the facility.

## 2024-06-15 NOTE — ED Notes (Signed)
 Attempted to call Madison Glover, pt's listed contact and HOA, no answer at this time. Message left on voicemail. Will attempt to call back again to pick pt up as pt is being discharged

## 2024-06-15 NOTE — ED Provider Notes (Signed)
 [place] [facility]ERGENCY DEPARTMENT AT [facility] Provider Note   CSN: [id number] Arrival date & time: 06/15/24  1235     Patient presents with: No chief complaint on file.   Madison Glover is a 73 y.o. female.   73 year old female presents via EMS from assisted living facility.  Patient states that she got out of the shower and slipped on her bath mat causing her to fall and hit her back.  She denies hitting her head, loss of consciousness or back pain.  She denies any injuries as a result of this fall.  Patient was able to stand on her own after the fall and informed staff of the fall.  Patient with history of short-term memory difficulties.  Not on thinners.  Past medical history of hypertension, hyperlipidemia, osteoporosis, memory loss.       Prior to Admission medications   Medication Sig Start Date End Date Taking? Authorizing Provider  risperiDONE (RISPERDAL) 0.25 MG tablet Take by mouth. 05/16/24  Yes [provider]  atorvastatin (LIPITOR) 20 MG tablet Take 20 mg by mouth daily.    [provider]  donepezil  (ARICEPT ) 10 MG tablet Take 1 tablet (10 mg total) by mouth at bedtime. 11/06/20   Gayland Lauraine PARAS, NP  hydrochlorothiazide  (HYDRODIURIL ) 25 MG tablet TAKE 1 TABLET BY MOUTH EVERY DAY Patient taking differently: Take 25 mg by mouth daily. 02/04/21   Theophilus Andrews, Tully GRADE, MD  memantine  (NAMENDA ) 10 MG tablet Take 1 tablet (10 mg total) by mouth 2 (two) times daily. 11/06/20   Gayland Lauraine PARAS, NP  metoprolol  succinate (TOPROL -XL) 100 MG 24 hr tablet TAKE 1 TABLET BY MOUTH EVERY DAY 10/21/21   Theophilus Andrews, Tully GRADE, MD  sertraline  (ZOLOFT ) 25 MG tablet Take 1 tablet (25 mg total) by mouth daily. 07/03/21   Theophilus Andrews, Tully GRADE, MD    Allergies: Bee venom    Review of Systems Poor historian  Updated Vital Signs BP (!) 154/87   Pulse 80   Temp 98 F (36.7 C) (Oral)   Resp 18   Ht 4' 9 (1.448 m)   Wt 63.5 kg   SpO2 93%   BMI  30.29 kg/m   Physical Exam Vitals and nursing note reviewed.  Constitutional:      General: She is not in acute distress.    Appearance: She is well-developed. She is not diaphoretic.  HENT:     Head: Normocephalic and atraumatic.  Pulmonary:     Effort: Pulmonary effort is normal.  Musculoskeletal:        General: No swelling, tenderness, deformity or signs of injury. Normal range of motion.     Cervical back: No tenderness or bony tenderness.     Thoracic back: No tenderness or bony tenderness.     Lumbar back: No tenderness or bony tenderness.     Comments: No pain with palpation or ROM of the upper or lower extremities   Skin:    General: Skin is warm and dry.     Findings: No bruising, erythema or rash.  Neurological:     General: No focal deficit present.     Mental Status: She is alert.  Psychiatric:        Behavior: Behavior normal.     (all labs ordered are listed, but only abnormal results are displayed) Labs Reviewed - No data to display  EKG: None  Radiology: CT Head Wo Contrast Result Date: 06/15/2024 CLINICAL DATA:  Fall getting out of  the shower.  Head/neck injury. EXAM: CT HEAD WITHOUT CONTRAST CT CERVICAL SPINE WITHOUT CONTRAST TECHNIQUE: Multidetector CT imaging of the head and cervical spine was performed following the standard protocol without intravenous contrast. Multiplanar CT image reconstructions of the cervical spine were also generated. RADIATION DOSE REDUCTION: This exam was performed according to the departmental dose-optimization program which includes automated exposure control, adjustment of the mA and/or kV according to patient size and/or use of iterative reconstruction technique. COMPARISON:  MRI brain 10/15/2019 FINDINGS: CT HEAD FINDINGS Brain: Ventricles, cisterns and other CSF spaces are normal. There is no mass, mass effect, shift of midline structures or acute hemorrhage. Mild chronic ischemic microvascular disease is present. Vascular:  No hyperdense vessel or unexpected calcification. Skull: Normal. Negative for fracture or focal lesion. Sinuses/Orbits: No acute finding. Other: None. CT CERVICAL SPINE FINDINGS Alignment: No posttraumatic subluxation. Skull base and vertebrae: Vertebral body heights are maintained. There is mild to moderate spondylosis throughout the cervical spine to include uncovertebral joint spurring and facet arthropathy. Atlantoaxial articulation is unremarkable. Minimal bilateral neural foraminal narrowing at the C4-5 level and C5-6 level with mild right-sided neural from narrowing at the C6-7 level. No acute fracture. Soft tissues and spinal canal: No prevertebral fluid or swelling. No visible canal hematoma. Disc levels: There is narrowed disc space at the C3-4, C4-5, C5-6 and C6-7 levels. Upper chest: No acute findings. Biapical moderate pleural calcification. Other: None. IMPRESSION: 1. No acute brain injury. 2. Mild chronic ischemic microvascular disease. 3. No acute cervical spine injury. 4. 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 Electronically Signed   By: Toribio Agreste M.D.   On: 06/15/2024 14:35   CT Cervical Spine Wo Contrast Result Date: 06/15/2024 CLINICAL DATA:  Fall getting out of the shower.  Head/neck injury. EXAM: CT HEAD WITHOUT CONTRAST CT CERVICAL SPINE WITHOUT CONTRAST TECHNIQUE: Multidetector CT imaging of the head and cervical spine was performed following the standard protocol without intravenous contrast. Multiplanar CT image reconstructions of the cervical spine were also generated. RADIATION DOSE REDUCTION: This exam was performed according to the departmental dose-optimization program which includes automated exposure control, adjustment of the mA and/or kV according to patient size and/or use of iterative reconstruction technique. COMPARISON:  MRI brain 10/15/2019 FINDINGS: CT HEAD FINDINGS Brain:  Ventricles, cisterns and other CSF spaces are normal. There is no mass, mass effect, shift of midline structures or acute hemorrhage. Mild chronic ischemic microvascular disease is present. Vascular: No hyperdense vessel or unexpected calcification. Skull: Normal. Negative for fracture or focal lesion. Sinuses/Orbits: No acute finding. Other: None. CT CERVICAL SPINE FINDINGS Alignment: No posttraumatic subluxation. Skull base and vertebrae: Vertebral body heights are maintained. There is mild to moderate spondylosis throughout the cervical spine to include uncovertebral joint spurring and facet arthropathy. Atlantoaxial articulation is unremarkable. Minimal bilateral neural foraminal narrowing at the C4-5 level and C5-6 level with mild right-sided neural from narrowing at the C6-7 level. No acute fracture. Soft tissues and spinal canal: No prevertebral fluid or swelling. No visible canal hematoma. Disc levels: There is narrowed disc space at the C3-4, C4-5, C5-6 and C6-7 levels. Upper chest: No acute findings. Biapical moderate pleural calcification. Other: None. IMPRESSION: 1. No acute brain injury. 2. Mild chronic ischemic microvascular disease. 3. No acute cervical spine injury. 4. 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 Electronically Signed   By: Toribio Agreste M.D.  On: 06/15/2024 14:35     Procedures   Medications Ordered in the ED - No data to display                                  Medical Decision Making Amount and/or Complexity of Data Reviewed Radiology: ordered.   73 year old female presents from assisted living for evaluation after a fall.  Patient denies any injuries from the fall, states that she has been ambulatory without difficulty and would like to go back home.  Patient repetitive in her request for Ellouise and Nichole to come take her home.  After acknowledged that we would contact Ellouise and  Nichole, patient again asks to contact these people.  Concern for her repetitive questioning and history of short-term memory difficulties.  Her exam is reassuring however will complete CT head and C-spine to evaluate for intracranial injury and fracture.  CT head and C-spine as ordered per myself are negative for acute abnormality.  Agree with radiology interpretation.  Patient is discharged back to facility.     Final diagnoses:  Fall, initial encounter    ED Discharge Orders     None          Beverley Leita DELENA DEVONNA 06/15/24 1445    Dasie Faden, MD 06/16/24 [phone]

## 2024-06-15 NOTE — ED Notes (Addendum)
 Attempted to call Donia Metro again at this time. No answer. Will attempt to call other contact Alva Lesches.
